# Patient Record
Sex: Female | Born: 1935 | Race: White | Hispanic: No | Smoking: Former smoker
Health system: Southern US, Community
[De-identification: ages and names within clinical notes are randomized; demographics above are authoritative.]

## PROBLEM LIST (undated history)

## (undated) DIAGNOSIS — Z86010 Personal history of colonic polyps: Secondary | ICD-10-CM

## (undated) DIAGNOSIS — G301 Alzheimer's disease with late onset: Secondary | ICD-10-CM

## (undated) DIAGNOSIS — L409 Psoriasis, unspecified: Secondary | ICD-10-CM

## (undated) DIAGNOSIS — K5790 Diverticulosis of intestine, part unspecified, without perforation or abscess without bleeding: Secondary | ICD-10-CM

## (undated) DIAGNOSIS — H25019 Cortical age-related cataract, unspecified eye: Secondary | ICD-10-CM

## (undated) DIAGNOSIS — M1990 Unspecified osteoarthritis, unspecified site: Secondary | ICD-10-CM

## (undated) DIAGNOSIS — M8000XD Age-related osteoporosis with current pathological fracture, unspecified site, subsequent encounter for fracture with routine healing: Secondary | ICD-10-CM

## (undated) DIAGNOSIS — S329XXA Fracture of unspecified parts of lumbosacral spine and pelvis, initial encounter for closed fracture: Secondary | ICD-10-CM

## (undated) DIAGNOSIS — I10 Essential (primary) hypertension: Secondary | ICD-10-CM

## (undated) DIAGNOSIS — I4510 Unspecified right bundle-branch block: Secondary | ICD-10-CM

## (undated) DIAGNOSIS — F32A Depression, unspecified: Secondary | ICD-10-CM

## (undated) DIAGNOSIS — F0281 Dementia in other diseases classified elsewhere with behavioral disturbance: Secondary | ICD-10-CM

## (undated) DIAGNOSIS — R42 Dizziness and giddiness: Secondary | ICD-10-CM

## (undated) DIAGNOSIS — R011 Cardiac murmur, unspecified: Secondary | ICD-10-CM

## (undated) DIAGNOSIS — K649 Unspecified hemorrhoids: Secondary | ICD-10-CM

## (undated) DIAGNOSIS — F329 Major depressive disorder, single episode, unspecified: Secondary | ICD-10-CM

## (undated) DIAGNOSIS — F0280 Dementia in other diseases classified elsewhere without behavioral disturbance: Secondary | ICD-10-CM

## (undated) DIAGNOSIS — Z8669 Personal history of other diseases of the nervous system and sense organs: Secondary | ICD-10-CM

## (undated) DIAGNOSIS — E559 Vitamin D deficiency, unspecified: Secondary | ICD-10-CM

## (undated) DIAGNOSIS — D696 Thrombocytopenia, unspecified: Secondary | ICD-10-CM

## (undated) HISTORY — DX: Cortical age-related cataract, unspecified eye: H25.019

## (undated) HISTORY — DX: Thrombocytopenia, unspecified: D69.6

## (undated) HISTORY — DX: Unspecified right bundle-branch block: I45.10

## (undated) HISTORY — DX: Alzheimer's disease with late onset: G30.1

## (undated) HISTORY — DX: Unspecified osteoarthritis, unspecified site: M19.90

## (undated) HISTORY — PX: ABDOMINAL HYSTERECTOMY: SHX81

## (undated) HISTORY — DX: Fracture of unspecified parts of lumbosacral spine and pelvis, initial encounter for closed fracture: S32.9XXA

## (undated) HISTORY — DX: Essential (primary) hypertension: I10

## (undated) HISTORY — DX: Dementia in other diseases classified elsewhere with behavioral disturbance: F02.81

## (undated) HISTORY — DX: Age-related osteoporosis with current pathological fracture, unspecified site, subsequent encounter for fracture with routine healing: M80.00XD

## (undated) HISTORY — DX: Unspecified hemorrhoids: K64.9

## (undated) HISTORY — DX: Psoriasis, unspecified: L40.9

## (undated) HISTORY — PX: CATARACT EXTRACTION, BILATERAL: SHX1313

## (undated) HISTORY — PX: APPENDECTOMY: SHX54

## (undated) HISTORY — DX: Diverticulosis of intestine, part unspecified, without perforation or abscess without bleeding: K57.90

## (undated) HISTORY — DX: Dizziness and giddiness: R42

## (undated) HISTORY — DX: Personal history of other diseases of the nervous system and sense organs: Z86.69

## (undated) HISTORY — DX: Major depressive disorder, single episode, unspecified: F32.9

## (undated) HISTORY — PX: COLONOSCOPY W/ POLYPECTOMY: SHX1380

## (undated) HISTORY — DX: Depression, unspecified: F32.A

## (undated) HISTORY — DX: Cardiac murmur, unspecified: R01.1

## (undated) HISTORY — DX: Personal history of colonic polyps: Z86.010

## (undated) HISTORY — DX: Vitamin D deficiency, unspecified: E55.9

## (undated) HISTORY — PX: MASTOIDECTOMY: SHX711

## (undated) HISTORY — PX: CHOLECYSTECTOMY: SHX55

---

## 2005-08-26 ENCOUNTER — Ambulatory Visit: Payer: Self-pay | Admitting: Family Medicine

## 2005-12-13 HISTORY — PX: CATARACT EXTRACTION EXTRACAPSULAR: SHX1305

## 2007-12-27 ENCOUNTER — Ambulatory Visit: Payer: Self-pay | Admitting: Family Medicine

## 2008-04-13 ENCOUNTER — Ambulatory Visit: Payer: Self-pay | Admitting: Family Medicine

## 2008-11-22 ENCOUNTER — Ambulatory Visit: Payer: Self-pay | Admitting: Gastroenterology

## 2008-11-22 DIAGNOSIS — Z860100 Personal history of colon polyps, unspecified: Secondary | ICD-10-CM

## 2008-11-22 DIAGNOSIS — K5790 Diverticulosis of intestine, part unspecified, without perforation or abscess without bleeding: Secondary | ICD-10-CM

## 2008-11-22 HISTORY — DX: Personal history of colonic polyps: Z86.010

## 2008-11-22 HISTORY — DX: Diverticulosis of intestine, part unspecified, without perforation or abscess without bleeding: K57.90

## 2008-11-22 HISTORY — DX: Personal history of colon polyps, unspecified: Z86.0100

## 2009-01-07 ENCOUNTER — Ambulatory Visit: Payer: Self-pay | Admitting: Family Medicine

## 2010-04-07 ENCOUNTER — Ambulatory Visit: Payer: Self-pay | Admitting: Family Medicine

## 2012-03-06 LAB — COMPREHENSIVE METABOLIC PANEL
Albumin: 3.9 g/dL (ref 3.4–5.0)
Alkaline Phosphatase: 144 U/L — ABNORMAL HIGH (ref 50–136)
Anion Gap: 12 (ref 7–16)
BUN: 17 mg/dL (ref 7–18)
EGFR (African American): >60
EGFR (Non-African Amer.): >60
Glucose: 126 mg/dL — ABNORMAL HIGH (ref 65–99)
Osmolality: 284 (ref 275–301)
SGOT(AST): 38 U/L — ABNORMAL HIGH (ref 15–37)
SGPT (ALT): 30 U/L
Total Protein: 7 g/dL (ref 6.4–8.2)

## 2012-03-06 LAB — URINALYSIS, COMPLETE
Bacteria: NONE SEEN
Bilirubin,UR: NEGATIVE
Glucose,UR: NEGATIVE mg/dL (ref 0–75)
Leukocyte Esterase: NEGATIVE
Ph: 6 (ref 4.5–8.0)
RBC,UR: <1 /HPF (ref 0–5)
Squamous Epithelial: 1

## 2012-03-06 LAB — CBC
HCT: 40.2 % (ref 35.0–47.0)
MCV: 95 fL (ref 80–100)
RBC: 4.25 10*6/uL (ref 3.80–5.20)
WBC: 11.5 10*3/uL — ABNORMAL HIGH (ref 3.6–11.0)

## 2012-03-06 LAB — CK TOTAL AND CKMB (NOT AT ARMC): CK, Total: 370 U/L — ABNORMAL HIGH (ref 21–215)

## 2012-03-07 ENCOUNTER — Observation Stay: Payer: Self-pay | Admitting: Internal Medicine

## 2012-03-07 LAB — CK TOTAL AND CKMB (NOT AT ARMC)
CK, Total: 250 U/L — ABNORMAL HIGH (ref 21–215)
CK-MB: 2.6 ng/mL (ref 0.5–3.6)

## 2012-03-07 LAB — TROPONIN I
Troponin-I: <0.02 ng/mL
Troponin-I: <0.02 ng/mL

## 2012-03-07 LAB — TSH: Thyroid Stimulating Horm: 0.712 u[IU]/mL

## 2012-03-09 ENCOUNTER — Encounter: Payer: Self-pay | Admitting: Internal Medicine

## 2012-03-13 ENCOUNTER — Encounter: Payer: Self-pay | Admitting: Internal Medicine

## 2012-04-12 ENCOUNTER — Encounter: Payer: Self-pay | Admitting: Internal Medicine

## 2013-03-02 ENCOUNTER — Ambulatory Visit: Payer: Self-pay | Admitting: Family Medicine

## 2013-06-04 ENCOUNTER — Encounter: Payer: Self-pay | Admitting: Neurology

## 2013-06-12 ENCOUNTER — Encounter: Payer: Self-pay | Admitting: Neurology

## 2013-07-13 ENCOUNTER — Encounter: Payer: Self-pay | Admitting: Neurology

## 2013-07-24 ENCOUNTER — Ambulatory Visit: Payer: Self-pay | Admitting: Family Medicine

## 2013-07-27 ENCOUNTER — Ambulatory Visit: Payer: Self-pay | Admitting: Family Medicine

## 2013-08-29 ENCOUNTER — Encounter: Payer: Self-pay | Admitting: Neurology

## 2013-09-01 ENCOUNTER — Ambulatory Visit: Payer: Self-pay | Admitting: Neurology

## 2013-09-12 ENCOUNTER — Encounter: Payer: Self-pay | Admitting: Neurology

## 2014-05-09 ENCOUNTER — Ambulatory Visit: Payer: Self-pay | Admitting: Family Medicine

## 2014-05-09 DIAGNOSIS — R011 Cardiac murmur, unspecified: Secondary | ICD-10-CM

## 2014-05-09 DIAGNOSIS — R42 Dizziness and giddiness: Secondary | ICD-10-CM

## 2014-08-27 ENCOUNTER — Encounter: Payer: Self-pay | Admitting: Family Medicine

## 2014-09-05 ENCOUNTER — Encounter (INDEPENDENT_AMBULATORY_CARE_PROVIDER_SITE_OTHER): Payer: Self-pay

## 2014-09-05 ENCOUNTER — Ambulatory Visit (INDEPENDENT_AMBULATORY_CARE_PROVIDER_SITE_OTHER): Payer: Medicare Other | Admitting: Cardiovascular Disease

## 2014-09-05 ENCOUNTER — Encounter: Payer: Self-pay | Admitting: Cardiovascular Disease

## 2014-09-05 VITALS — BP 129/71 | HR 71 | Ht 64.0 in | Wt 125.0 lb

## 2014-09-05 DIAGNOSIS — H7490 Unspecified disorder of middle ear and mastoid, unspecified ear: Secondary | ICD-10-CM | POA: Insufficient documentation

## 2014-09-05 DIAGNOSIS — Z0000 Encounter for general adult medical examination without abnormal findings: Secondary | ICD-10-CM

## 2014-09-05 DIAGNOSIS — H70899 Other mastoiditis and related conditions, unspecified ear: Secondary | ICD-10-CM

## 2014-09-05 DIAGNOSIS — F419 Anxiety disorder, unspecified: Secondary | ICD-10-CM

## 2014-09-05 DIAGNOSIS — F329 Major depressive disorder, single episode, unspecified: Secondary | ICD-10-CM | POA: Insufficient documentation

## 2014-09-05 DIAGNOSIS — I10 Essential (primary) hypertension: Secondary | ICD-10-CM

## 2014-09-05 DIAGNOSIS — F341 Dysthymic disorder: Secondary | ICD-10-CM

## 2014-09-05 DIAGNOSIS — R42 Dizziness and giddiness: Secondary | ICD-10-CM

## 2014-09-05 NOTE — Patient Instructions (Addendum)
You are doing well. No medication changes were made.  Please monitor your blood pressure and heart rate when you have dizziness Call if you have worsening symptoms, We would order a holter monitor  Please sign up at the [employer]/forever fit leg strength  Please call us if you have new issues that need to be addressed before your next appt.

## 2014-09-05 NOTE — Progress Notes (Signed)
Patient ID: [name], female    DOB: 1934-10-20, 80 y.o.   MRN: [id number]  HPI Comments: [name] is a very pleasant 80 year old woman with history of anxiety, depression, chronic dizziness, prior mastoid surgery, hypertension who presents for symptoms of dizziness.  She presents with her daughter. The patient reports that she has significant stress. She has a son with Down's syndrome who is now with dementia in a nursing home. She's had a long history of depression, sees a Social worker. Maintained on Lexapro.  She reports having significant symptoms of dizziness several weeks ago, approximately 3-4 weeks ago. Since then she has started physical therapy, has become more active. Her symptoms seem to have resolved. She was seen previously by Dr. Pryor Ochoa of ear nose throat. Numerous tests were run given her history of mastoid surgery at a young age. Family reports that these tests were essentially negative. Symptoms do not seem consistent with typical vertigo but she does report some symptoms when turning her head to the left as that does trigger dizziness. She has followup with ENT in several months time.  In general she has been sedentary, no irregular exercise, husband died several years ago. She lives by herself. She walks her dog around her property otherwise does not go and do very much. She reports that stress brings on her dizziness. No symptoms of orthostasis. She was not able to detail much about her symptoms of dizziness and it was uncertain whether this presented while she was sitting or standing, most likely standing. Again it seemed to happen only when she had stress or anxiety. She denied any palpitations or orthostatic type symptoms. She reports having a Holter monitor done last year by Dr. Lovie Macadamia. Results unavailable.  EKG shows normal sinus rhythm with rate 71 beats per minute, right bundle branch block, left anterior fascicular block No change from prior EKG in 2013 Prior  carotid ultrasound March 2013 with no significant disease    Outpatient Encounter Prescriptions as of 09/05/2014  Medication Sig  . amLODipine (NORVASC) 10 MG tablet   . ergocalciferol (VITAMIN D2) 50000 UNITS capsule Take 50,000 Units by mouth once a week.  . escitalopram (LEXAPRO) 10 MG tablet      Review of Systems  Constitutional: Negative.   HENT: Negative.   Eyes: Negative.   Respiratory: Negative.   Cardiovascular: Negative.   Gastrointestinal: Negative.   Endocrine: Negative.   Musculoskeletal: Negative.   Skin: Negative.   Allergic/Immunologic: Negative.   Neurological: Positive for dizziness.  Hematological: Negative.   Psychiatric/Behavioral: Negative.   All other systems reviewed and are negative.   BP 129/71  Pulse 71  Ht 5\' 4"  (1.626 m)  Wt 125 lb (56.7 kg)  BMI 21.45 kg/m2  Physical Exam  Nursing note and vitals reviewed. Constitutional: She is oriented to person, place, and time. She appears well-developed and well-nourished.  HENT:  Head: Normocephalic.  Nose: Nose normal.  Mouth/Throat: Oropharynx is clear and moist.  Eyes: Conjunctivae are normal. Pupils are equal, round, and reactive to light.  Neck: Normal range of motion. Neck supple. No JVD present.  Cardiovascular: Normal rate, regular rhythm, S1 normal, S2 normal, normal heart sounds and intact distal pulses.  Exam reveals no gallop and no friction rub.   No murmur heard. Pulmonary/Chest: Effort normal and breath sounds normal. No respiratory distress. She has no wheezes. She has no rales. She exhibits no tenderness.  Abdominal: Soft. Bowel sounds are normal. She exhibits no distension. There is no tenderness.  Musculoskeletal: Normal range of motion. She exhibits no edema and no tenderness.  Lymphadenopathy:    She has no cervical adenopathy.  Neurological: She is alert and oriented to person, place, and time. Coordination normal.  Skin: Skin is warm and dry. No rash noted. No erythema.   Psychiatric: She has a normal mood and affect. Her behavior is normal. Judgment and thought content normal.    Assessment and Plan

## 2014-09-05 NOTE — Assessment & Plan Note (Signed)
I suspect this is the major issue. She would do better with daily exercise

## 2014-09-05 NOTE — Assessment & Plan Note (Signed)
Suggested monitoring of blood pressure at home. Would decrease the amlodipine in half for any hypotension

## 2014-09-05 NOTE — Assessment & Plan Note (Signed)
Recently seen by Dr. Pryor Ochoa. She has had thorough workup. MRI of the head has been put on hold by the patient

## 2014-09-05 NOTE — Assessment & Plan Note (Signed)
Etiology of her dizziness is likely noncardiac. This seems to be associated with anxiety and depression, and a general feeling of instability. Symptoms seem to have improved with physical therapy. We have recommended she participate in Silver sneakers and forever fit in an effort to maintain her leg strength. He is otherwise independent and able to drive. Recommended he monitor blood pressure and heart rate. Any indication of low blood pressure, recommend that she decrease the amlodipine down to 5 mg daily. If there is concern of arrhythmia based on blood pressure cuff measurements for phone pulse tracking, recommended they call for a Holter monitor

## 2014-09-12 ENCOUNTER — Encounter: Payer: Self-pay | Admitting: Family Medicine

## 2014-10-13 ENCOUNTER — Encounter: Payer: Self-pay | Admitting: Family Medicine

## 2015-02-04 DIAGNOSIS — E559 Vitamin D deficiency, unspecified: Secondary | ICD-10-CM | POA: Diagnosis not present

## 2015-02-04 DIAGNOSIS — I10 Essential (primary) hypertension: Secondary | ICD-10-CM | POA: Diagnosis not present

## 2015-02-04 DIAGNOSIS — Z23 Encounter for immunization: Secondary | ICD-10-CM | POA: Diagnosis not present

## 2015-02-04 DIAGNOSIS — F329 Major depressive disorder, single episode, unspecified: Secondary | ICD-10-CM | POA: Diagnosis not present

## 2015-03-06 DIAGNOSIS — F329 Major depressive disorder, single episode, unspecified: Secondary | ICD-10-CM | POA: Diagnosis not present

## 2015-04-03 DIAGNOSIS — D696 Thrombocytopenia, unspecified: Secondary | ICD-10-CM | POA: Diagnosis not present

## 2015-04-03 DIAGNOSIS — R635 Abnormal weight gain: Secondary | ICD-10-CM | POA: Diagnosis not present

## 2015-04-03 DIAGNOSIS — F329 Major depressive disorder, single episode, unspecified: Secondary | ICD-10-CM | POA: Diagnosis not present

## 2015-04-03 DIAGNOSIS — E559 Vitamin D deficiency, unspecified: Secondary | ICD-10-CM | POA: Diagnosis not present

## 2015-04-03 DIAGNOSIS — R000 Tachycardia, unspecified: Secondary | ICD-10-CM | POA: Diagnosis not present

## 2015-04-03 DIAGNOSIS — R42 Dizziness and giddiness: Secondary | ICD-10-CM | POA: Diagnosis not present

## 2015-04-03 DIAGNOSIS — R748 Abnormal levels of other serum enzymes: Secondary | ICD-10-CM | POA: Diagnosis not present

## 2015-04-06 NOTE — Discharge Summary (Signed)
PATIENT NAME:  [name], [name] MR#:  [id number] DATE OF BIRTH:  November 02, 1934  DATE OF ADMISSION:  03/07/2012 DATE OF DISCHARGE:  03/09/2012  DISCHARGE DIAGNOSES:  1. Syncope. 2. Pubic rami fracture.  3. Elevated creatine kinase.  4. Depression.   DISCHARGE MEDICATIONS:  1. Zoloft 100 mg daily.  2. Norco 5/325 every 4 hours p.r.n.   REASON FOR ADMISSION: The patient is a 81 year old female who was climbing up some stairs when she fell. It was unclear how long she stayed on the floor. According to her daughter, she was unsure if she actually passed out. She was brought to the ER for further treatment.   HOSPITAL COURSE:  1. Syncope: Again, it is not clear whether she actually did pass out. Her work-up was negative with CT. She was monitored while she was here with no other problems. Her daughter did tell us that she falls often.  2. Elevated CPK: She may have had some mild rhabdomyolysis from being down on the floor, but this quickly corrected out.  3. Pubic rami fracture: This was discovered on consultation with the orthopedist. CT was ordered of the pelvis which showed a nondisplaced fracture. This accounted for her pain. She was working with Physical Therapy, who recommended that she go for rehabilitation; so she was being discharged to rehab.   DISPOSITION: The patient is discharged in stable condition to rehab.   TIME SPENT ON DISCHARGE: 35 minutes.  ____________________________ Baxter Hire, MD jdj:cbb D: 03/15/2012 11:44:56 ET T: 03/15/2012 11:53:25 ET JOB#: [id number] Baxter Hire MD ELECTRONICALLY SIGNED 03/15/2012 13:50

## 2015-04-06 NOTE — H&P (Signed)
PATIENT NAME:  [name], [name] MR#:  [id number] DATE OF BIRTH:  09-26-1934  DATE OF ADMISSION:  03/07/2012  PRIMARY CARE PHYSICIAN: Dr. Lovie Macadamia   CHIEF COMPLAINT: Status post fall, syncope, and back pain.   HISTORY OF PRESENT ILLNESS: [name] is a 81 year old pleasant Caucasian female with unremarkable past medical history apart from depression who was in her usual state of health until today around noon. She was trying to go to the attic area and she pulled the stairs down, and while she was climbing up the stairs broke and she fell on her back. She apparently sustained loss of consciousness; however, the patient cannot give details about that. It is unclear how long she stayed on the floor until her daughter managed to get to her around 5:00 p.m. and she found her on the floor. The ambulance was called the patient was transported to the Emergency Department. Evaluation here reveals anterior frontal scalp area laceration with crusted blood. Neurologically she was intact without any focal weakness. CT scan of the head and CT scan of the cervical spine were unremarkable. The patient was admitted for 24-hour observation to monitor for any symptoms of recurrent syncope or any neurologic development.   REVIEW OF SYSTEMS: CONSTITUTIONAL: Denies any fever. No chills. No night sweats. No fatigue. EYES: No blurring of vision. No double vision. ENT: No hearing impairment. No sore throat. No dysphagia. CARDIOVASCULAR: No chest pain. No shortness of breath. No edema. No palpitations. RESPIRATORY: No cough. No sputum production. No chest pain. No shortness of breath. GASTROINTESTINAL: No abdominal pain. No nausea, no vomiting, no diarrhea. GENITOURINARY: No dysuria or frequency of urination. MUSCULOSKELETAL: No joint  pain or swelling. No muscular pain or swelling other than she is sore in her back area, nonlocalized with more pain and soreness in the lower back area. INTEGUMENT: No skin rash. No ulcers apart from  the scalp laceration and the bruise on the frontal scalp area. NEUROLOGY: No focal weakness. No seizure activity. No headache. PSYCHIATRY: No anxiety, but there is history of depression. ENDOCRINE: No polyuria or polydipsia. No heat or cold intolerance.   PAST MEDICAL HISTORY: History of depression.   SOCIAL HABITS: Nonsmoker, but she is an ex-chronic smoker, quit 12 years ago. She drinks alcohol only socially, usually wine. No other drug abuse.   SOCIAL HISTORY: She is widowed. Lives at home alone and is visited by her daughter.   FAMILY HISTORY: Her mother died from stroke in her 13s. She has a twin sister who has hypertension and breast cancer.   ADMISSION MEDICATIONS:  1. Zoloft 100 mg a day.  2. Prozac 40 mg, this is only sometimes.   ALLERGIES: No known drug allergies.   PHYSICAL EXAMINATION:  VITAL SIGNS: Blood pressure 161/73, respiratory rate 18, pulse 105, temperature 96.4, oxygen saturation 95%.   GENERAL APPEARANCE: Elderly lady laying in bed in no acute distress.   HEAD AND NECK EXAMINATION: No pallor. No icterus. No cyanosis.   ENT: Hearing was normal. Nasal mucosa, lips, tongue were normal.   EYES: Normal iris and conjunctivae. Pupils about 4 to 5 mm, equal, sluggishly reactive to light.   NECK: Supple. Trachea at midline. No thyromegaly. No cervical lymphadenopathy. No masses.   HEART: Normal S1, S2. No S3, S4. No murmur. No gallop. No carotid bruits.   RESPIRATORY: Normal breathing pattern without use of accessory muscles. No rales. No wheezing.   ABDOMEN: Soft without tenderness. No hepatosplenomegaly. No masses. No hernias.   MUSCULOSKELETAL: No joint  swelling. No clubbing.   SKIN: No ulcers. No subcutaneous nodules. There is anterior scalp small laceration with crusted blood.   NEUROLOGIC: Cranial nerves II through XII are intact. No focal motor deficit.   PSYCHIATRIC: The patient is alert and oriented times three. Mood and affect were normal.    LABORATORY, DIAGNOSTIC, AND RADIOLOGICAL DATA: EKG showed sinus tachycardia at rate of 105 per minute. Right bundle branch block. Otherwise unremarkable EKG. CT scan of the head without contrast showed no acute intracranial process. CT scan of the cervical spine revealed no acute osseous injury of the cervical spine. Serum glucose 126, BUN 17, creatinine 0.5, sodium 141, potassium 3.5. Her liver function tests showed total protein 7, albumin 3.9, bilirubin 2.1, alkaline phosphatase 144, AST elevated at 38, ALT normal at 30. Total CPK elevated at 370, CK-MB fraction up at 5.5. Troponin was normal at 0.02. CBC showed white count of 11,000, hemoglobin 13, hematocrit 40, platelet count 132. Urinalysis was unremarkable.   ASSESSMENT:  1. Syncope status post fall without any focal neurologic finding. 2. Elevated CPK likely from rhabdomyolysis from her fall or being on the floor for several hours. Elevated bilirubin and alkaline phosphatase of unclear etiology. 3. Mild elevation of  white blood cell count and mild thrombocytopenia.  4. History of depression.  5. Mild anterior scalp laceration.   PLAN: The patient will be admitted to the medical floor for observation with frequent neurologic examination and check-ups. Gentle IV hydration. I will follow up on her CPK and troponin. The mild elevation of bilirubin and alkaline phosphatase can be followed up as an outpatient. I will obtain an x-ray of the lumbosacral spine since she has low back pain. Monitor her blood pressure as it is elevated. She might have underlying hypertension. I will continue the Zoloft, but hold the Prozac.   TIME SPENT EVALUATING THIS PATIENT: More than 45 minutes.     ____________________________ Clovis Pu. Lenore Manner, MD amd:bjt D: 03/06/2012 22:41:57 ET T: 03/07/2012 09:29:11 ET JOB#: [id number]  cc: Clovis Pu. Lenore Manner, MD, <Dictator> Youlanda Roys. Lovie Macadamia, MD Ellin Saba MD ELECTRONICALLY SIGNED 03/07/2012 22:52

## 2015-04-06 NOTE — Consult Note (Signed)
Brief Consult Note: Diagnosis: Left pelvic fracture.   Patient was seen by consultant.   Comments: PWB with Physical Therapy Will need rehab or home Physical Therapy Needs ortho F/U in 3-4 weeks.  Electronic Signatures: Lynnda Shields (MD)  (Signed 26-Mar-13 18:16)  Authored: Brief Consult Note   Last Updated: 26-Mar-13 18:16 by Lynnda Shields (MD)

## 2015-04-06 NOTE — Consult Note (Signed)
PATIENT NAME:  [name], [name] MR#:  [id number] DATE OF BIRTH:  Jul 12, 1934  DATE OF CONSULTATION:  03/07/2012  REFERRING PHYSICIAN:  Dr. Lenore Manner  CONSULTING PHYSICIAN:  Alysia Penna. Jove Beyl, MD  REASON FOR CONSULTATION: A 81 year old female with left hip/groin pain.   HISTORY OF PRESENT ILLNESS: The patient suffered a fall down some folding stairs going into the attic on 03/25. She injured her left hip. She was admitted for treatment for syncope, back pain, and hip pain. She had x-rays obtained and no fracture was noted. Physical therapy was started and she complained of significant left hip and groin pain. She denies any significant difficulty in this area. She does have a history of left-sided low back pain.   Also with her fall she suffered a laceration to her head. She had a head CT and CT of the cervical spine which were unremarkable. She did have some confusion, but this has now cleared.   PAST MEDICAL HISTORY: Depression.   SOCIAL HISTORY: Ex-smoker, quit 12 years ago. Occasional social alcohol drinker, usually wine. No drug use. She lives at home alone with close family members nearby.   FAMILY HISTORY: Cerebrovascular disease, hypertension, and breast cancer.   MEDICATIONS PRESENTLY: Per SCM. These are reviewed.   ALLERGIES: No known drug allergies.   REVIEW OF SYSTEMS: Unremarkable for any acute cardiorespiratory, GI, or GU symptoms. No fever, chills, or constitutional symptoms except as noted above.   PHYSICAL EXAMINATION:  GENERAL: The patient is alert and oriented. She is sitting in the bed eating.   VITALS: Blood pressure 150/80, pulse 80, respirations 18.   HEENT: Pupils equal, round, and reactive to light and accommodation. Extraocular movements intact.   NECK: Supple without bruits.   THORACIC SPINE: Nontender.   LUMBOSACRAL SPINE: Mildly tender in lumbosacral junction particularly on the left side. There is mild tenderness over the sacrum and over the left SI joint.    LEFT LOWER EXTREMITY: Left groin shows pain with range of motion located more toward the midline. Mild tenderness over the greater trochanter.  Knee, foot, and ankle are nontender. Neurovascular examination of the left lower extremity is intact.   RIGHT LOWER EXTREMITY: Right hip, knee, foot and ankle show full range of motion with normal neurovascular examination.   X-RAYS: X-rays are reviewed. The pelvis film and left hip film do show pelvic fractures of the inferior and superior pubic ramus on the left side. Other films are unremarkable including a CT scan of the neck except for showing degenerative change.   IMPRESSION:  1. Left pubic rami fracture.  2. Confusion, cleared.  3. Head laceration. No sequelae.  4. Medical issues as noted above.   PLAN: Physical therapy. Progressive weightbearing as tolerated. Follow up in orthopedics in 4 to 6 weeks, sooner if needed.  Will either most likely need rehab placement or home physical therapy.   ____________________________ Alysia Penna. Mauri Pole, MD jcc:bjt D: 03/08/2012 07:59:25 ET T: 03/08/2012 09:56:44 ET JOB#: [id number]  cc: Alysia Penna. Mauri Pole, MD, <Dictator> Alysia Penna Galo Sayed MD ELECTRONICALLY SIGNED 04/07/2012 6:58

## 2015-04-14 ENCOUNTER — Other Ambulatory Visit: Payer: Self-pay | Admitting: Family Medicine

## 2015-04-14 DIAGNOSIS — R748 Abnormal levels of other serum enzymes: Secondary | ICD-10-CM

## 2015-04-17 ENCOUNTER — Ambulatory Visit: Admission: RE | Admit: 2015-04-17 | Payer: Self-pay | Source: Ambulatory Visit

## 2015-05-15 ENCOUNTER — Telehealth: Payer: Self-pay

## 2015-05-15 NOTE — Telephone Encounter (Signed)
She would like to speak with you in regards to having some increased issues with depression. Having a hard time dealing with emotions for her mentally challenged son. I asked her if she was having any thoughts of self harm and she said no. I offered her a phone number to the mobile crisis line and she refused and stated that you know her history and she'd rather talk with you first.

## 2015-05-15 NOTE — Telephone Encounter (Signed)
I talked with patient at lunch, gave her instructions to call counselor, call us back if naything needed

## 2015-07-08 ENCOUNTER — Ambulatory Visit: Payer: Self-pay | Admitting: Family Medicine

## 2015-07-25 ENCOUNTER — Telehealth: Payer: Self-pay | Admitting: Family Medicine

## 2015-07-25 NOTE — Telephone Encounter (Signed)
Patient called needing an appointment for depression. She stated that her son died 1 week ago and she has fallen in a bad depression. I looked to see if we have any appt available soon but nothing until 08/06/15. She got upset and hanged the phone up. She didn't even give me a chance to look with another provider. Please call patient, thanks.

## 2015-07-25 NOTE — Telephone Encounter (Signed)
I called and offered my condolences She is not seeing a psychiatrist or counselor I told her to come in at 2:30 pm today  [place]  [place]  [phone]  [place]  [place]  [phone]

## 2015-07-28 ENCOUNTER — Ambulatory Visit: Payer: Self-pay | Admitting: Family Medicine

## 2015-07-31 ENCOUNTER — Ambulatory Visit: Payer: Self-pay | Admitting: Family Medicine

## 2015-08-01 ENCOUNTER — Ambulatory Visit: Payer: Self-pay | Admitting: Family Medicine

## 2015-09-04 ENCOUNTER — Other Ambulatory Visit: Payer: Self-pay

## 2015-09-04 NOTE — Telephone Encounter (Signed)
In her PP chart, we have Wellbutrin XL 150mg . The refill request was for 300mg . I did not see any note on it being increased, so I left the 150mg  strength in there.

## 2015-09-05 MED ORDER — BUPROPION HCL ER (XL) 150 MG PO TB24: ORAL_TABLET | ORAL | Status: DC

## 2015-09-05 NOTE — Telephone Encounter (Signed)
I spoke with [place] at [facility]. She used to be on 300mg  over a year ago and then it looks like she stopped it. You restarted her back on the 150mg  back in March, but it appears like she must have called in a 300mg  rx # that had refills. She has been getting the 300mg  for a while now but Arby Barrette states that she isn't getting it every month.

## 2015-09-05 NOTE — Telephone Encounter (Signed)
Called patient to schedule a f/u appointment for mood in the next 2 weeks. I left 2 vm on her answering machine to call us back.

## 2015-09-05 NOTE — Telephone Encounter (Signed)
[name] --> Please let [name] know that I'd like to see patient for an appointment here in the office for:  mood Please schedule a visit with me  in the next: two weeks Fasting?  Not necessary Thank you, Dr. Lovie Macadamia send Rx AMY --> find out from pharmacy if WE prescribed the 300 mg or if that came from another prescriber (psychiatrist); she should really be under the care of psychiatrist Back to me after you find out more about the 300 mg strength request

## 2016-01-21 ENCOUNTER — Ambulatory Visit: Payer: Self-pay | Admitting: Family Medicine

## 2016-01-21 DIAGNOSIS — E559 Vitamin D deficiency, unspecified: Secondary | ICD-10-CM | POA: Insufficient documentation

## 2016-01-21 DIAGNOSIS — F329 Major depressive disorder, single episode, unspecified: Secondary | ICD-10-CM | POA: Insufficient documentation

## 2016-01-21 DIAGNOSIS — I10 Essential (primary) hypertension: Secondary | ICD-10-CM | POA: Insufficient documentation

## 2016-01-21 DIAGNOSIS — F32A Depression, unspecified: Secondary | ICD-10-CM | POA: Insufficient documentation

## 2016-01-21 DIAGNOSIS — R011 Cardiac murmur, unspecified: Secondary | ICD-10-CM | POA: Insufficient documentation

## 2016-01-21 DIAGNOSIS — D696 Thrombocytopenia, unspecified: Secondary | ICD-10-CM | POA: Insufficient documentation

## 2016-01-21 DIAGNOSIS — I4510 Unspecified right bundle-branch block: Secondary | ICD-10-CM | POA: Insufficient documentation

## 2016-02-04 ENCOUNTER — Ambulatory Visit: Payer: Self-pay | Admitting: Family Medicine

## 2016-02-05 ENCOUNTER — Ambulatory Visit (INDEPENDENT_AMBULATORY_CARE_PROVIDER_SITE_OTHER): Payer: PPO | Admitting: Family Medicine

## 2016-02-05 ENCOUNTER — Encounter: Payer: Self-pay | Admitting: Family Medicine

## 2016-02-05 ENCOUNTER — Telehealth: Payer: Self-pay | Admitting: Family Medicine

## 2016-02-05 VITALS — BP 127/82 | HR 81 | Temp 98.5°F | Ht 61.5 in | Wt 125.6 lb

## 2016-02-05 DIAGNOSIS — F331 Major depressive disorder, recurrent, moderate: Secondary | ICD-10-CM | POA: Diagnosis not present

## 2016-02-05 DIAGNOSIS — D696 Thrombocytopenia, unspecified: Secondary | ICD-10-CM | POA: Diagnosis not present

## 2016-02-05 DIAGNOSIS — Z23 Encounter for immunization: Secondary | ICD-10-CM | POA: Diagnosis not present

## 2016-02-05 DIAGNOSIS — I10 Essential (primary) hypertension: Secondary | ICD-10-CM | POA: Diagnosis not present

## 2016-02-05 DIAGNOSIS — E559 Vitamin D deficiency, unspecified: Secondary | ICD-10-CM

## 2016-02-05 MED ORDER — BUPROPION HCL ER (XL) 150 MG PO TB24: 150.0000 mg | ORAL_TABLET | Freq: Every day | ORAL | Status: DC

## 2016-02-05 NOTE — Assessment & Plan Note (Signed)
Check CBC today.  

## 2016-02-05 NOTE — Assessment & Plan Note (Signed)
Check level and supplement as indicated 

## 2016-02-05 NOTE — Assessment & Plan Note (Addendum)
Check thyroid, vit D, B12; this has been lifelong per patient; we reviewed previous medicines, what has worked best for her; start her back on wellbutrin; close f/u in 2-3 weeks; call me with any issues prior to next appt; suggested she work with grief counselor at [facility], number provided

## 2016-02-05 NOTE — Telephone Encounter (Signed)
Patient daughter called stating that she needs to talk tot Dr. Sanda Klein regarding her mothers memory issues. Please call her back, thanks.

## 2016-02-05 NOTE — Patient Instructions (Addendum)
Let's start medicine We'll check labs today  Your goal blood pressure is less than 150 mmHg on top at least, and under 140 mmHg is fantastic Try to follow the DASH guidelines (DASH stands for Dietary Approaches to Stop Hypertension) Try to limit the sodium in your diet.  Ideally, consume less than 1.5 grams (less than 1,500mg ) per day. Do not add salt when cooking or at the table.  Check the sodium amount on labels when shopping, and choose items lower in sodium when given a choice. Avoid or limit foods that already contain a lot of sodium. Eat a diet rich in fruits and vegetables and whole grains.  Consider working with a Social worker, and [facility] has wonderful grief counselors: [phone] Return to see me in 2-3 weeks for medicine follow-up but call me sooner if needed  You received the flu shot today; it should protect you against the flu virus over the coming months; it will take about two weeks for antibodies to develop; do try to stay away from hospitals, nursing homes, and daycares during peak flu season; taking extra vitamin C daily during flu season may help you avoid getting sick  You have received the Pneumovax vaccine (PPSV-23) and you will not need another booster of this for the rest of your life per current ACIP guidelines

## 2016-02-05 NOTE — Progress Notes (Signed)
BP 127/82 mmHg  Pulse 81  Temp(Src) 98.5 F (36.9 C)  Ht 5' 1.5" (1.562 m)  Wt 125 lb 9.6 oz (56.972 kg)  BMI 23.35 kg/m2  SpO2 98%   Subjective:    Patient ID: [name], female    DOB: Jul 23, 1934, 82 y.o.   MRN: [id number]  HPI: [name] is a 82 y.o. female  Chief Complaint  Patient presents with  . Follow-up  . Depression    After her son's death and hasn't got over it. She stated "I watched him take his last breath". Son had Down Syndrome. Son passed away 9 months ago.   Marland Kitchen Anxiety   She says that she wants a brighter outlook on life; the depression is getting the absolute best of me she says After her son died, things started to go downhill She has not been seeing any mental health professional in a long time Daughter is too busy with her own family to have much time with her Has a dog at home She has a lady at church who is absolutely marvelous; she lost her husband 3 months ago Goes to [employer] She has not been on medicine for a long time; ready to start back on something Sick and tired of allowing herself to fight to be happy We reviewed her old mediciines, bupropion, escitalopram, Brintellx; she did best on the bupropion  Depression screen [facility] 2/9 02/05/2016  Decreased Interest 3  Down, Depressed, Hopeless 3  PHQ - 2 Score 6  Altered sleeping 0  Tired, decreased energy 2  Change in appetite 1  Feeling bad or failure about yourself  3  Trouble concentrating 2  Moving slowly or fidgety/restless 0  Suicidal thoughts 0  PHQ-9 Score 14  Difficult doing work/chores Not difficult at all   GAD 7 : Generalized Anxiety Score 02/05/2016  Nervous, Anxious, on Edge 3  Control/stop worrying 3  Worry too much - different things 3  Trouble relaxing 2  Restless 0  Easily annoyed or irritable 2  Afraid - awful might happen 3  Total GAD 7 Score 16  Anxiety Difficulty Not difficult at all   History of vit D deficiency  HTN; well-controlled on no  medicines  Flu shot and pneumonia shot given by staff prior to my entering room  Relevant past medical, surgical, family and social history reviewed and updated as indicated Past Medical History  Diagnosis Date  . Dizziness   . Depression   . Hx of migraine headaches   . Vitamin D deficiency disease   . Hypertension   . RBBB   . Thrombocytopenia ([place])   . Heart murmur    Past Surgical History  Procedure Laterality Date  . Hysterotomy    . Abdominal hysterectomy    . Cholecystectomy     Family History  Problem Relation Age of Onset  . Atrial fibrillation Sister   . Arthritis Mother   . Arthritis Father    Social History  Substance Use Topics  . Smoking status: Former Research scientist (life sciences)  . Smokeless tobacco: Never Used  . Alcohol Use: 1.8 oz/week    3 Glasses of wine per week     Comment: Per week   Interim medical history since our last visit reviewed. Allergies and medications reviewed and updated.  Review of Systems Per HPI unless specifically indicated above     Objective:    BP 127/82 mmHg  Pulse 81  Temp(Src) 98.5 F (36.9 C)  Ht  5' 1.5" (1.562 m)  Wt 125 lb 9.6 oz (56.972 kg)  BMI 23.35 kg/m2  SpO2 98%  Wt Readings from Last 3 Encounters:  02/05/16 125 lb 9.6 oz (56.972 kg)  04/03/15 135 lb (61.236 kg)  09/05/14 125 lb (56.7 kg)    Physical Exam  Constitutional: She appears well-developed and well-nourished.  Weight fluctuated up 10 pounds and then back down 10 pounds  HENT:  Mouth/Throat: Mucous membranes are normal.  Eyes: EOM are normal. No scleral icterus.  Cardiovascular: Normal rate and regular rhythm.   Pulmonary/Chest: Effort normal and breath sounds normal.  Abdominal: She exhibits no distension.  Neurological: She displays no tremor. Gait normal.  Skin: Skin is warm. No pallor.  Psychiatric: She has a normal mood and affect. Her behavior is normal. Judgment normal. Her mood appears not anxious. Her affect is not blunt and not labile. Cognition  and memory are normal. She does not exhibit a depressed mood. She expresses no suicidal ideation.  Bright affect, engaging, conversant, good eye contact, good hygiene      Assessment & Plan:   Problem List Items Addressed This Visit      Cardiovascular and Mediastinum   Essential hypertension    Controlled today; not on any meds; try DASH guidelines        Other   Vitamin D deficiency disease    Check level and supplement as indicated      Relevant Orders   VITAMIN D 25 Hydroxy (Vit-D Deficiency, Fractures)   Thrombocytopenia (HCC)    Check CBC today      Relevant Orders   CBC with Differential/Platelet   Major depressive disorder, recurrent, moderate (HCC) - Primary    Check thyroid, vit D, B12; this has been lifelong per patient; we reviewed previous medicines, what has worked best for her; start her back on wellbutrin; close f/u in 2-3 weeks; call me with any issues prior to next appt; suggested she work with Barrister's clerk at [employer], number provided      Relevant Medications   buPROPion (WELLBUTRIN XL) 150 MG 24 hr tablet   Other Relevant Orders   TSH   Vitamin B12    Other Visit Diagnoses    Need for influenza vaccination        offered and given today    Relevant Orders    Flu Vaccine QUAD 36+ mos PF IM (Fluarix & Fluzone Quad PF) (Completed)    Need for pneumococcal vaccination        PPSV-23 given by CMA today; no further boosters of this pneumonia vaccine needed for the rest of her life per ACIP guidelines    Relevant Orders    Pneumococcal polysaccharide vaccine 23-valent greater than or equal to 2yo subcutaneous/IM (Completed)       Follow up plan: Return 2-3 weeks, for medication follow-up.  Meds ordered this encounter  Medications  . buPROPion (WELLBUTRIN XL) 150 MG 24 hr tablet    Sig: Take 1 tablet (150 mg total) by mouth daily.    Dispense:  30 tablet    Refill:  0   Orders Placed This Encounter  Procedures  . Flu Vaccine QUAD 36+ mos PF  IM (Fluarix & Fluzone Quad PF)  . Pneumococcal polysaccharide vaccine 23-valent greater than or equal to 2yo subcutaneous/IM  . TSH  . VITAMIN D 25 Hydroxy (Vit-D Deficiency, Fractures)  . CBC with Differential/Platelet  . Vitamin B12   An after-visit summary was printed and given to the patient  at [place].  Please see the patient instructions which may contain other information and recommendations beyond what is mentioned above in the assessment and plan.

## 2016-02-05 NOTE — Assessment & Plan Note (Signed)
Controlled today; not on any meds; try DASH guidelines

## 2016-02-06 ENCOUNTER — Telehealth: Payer: Self-pay | Admitting: Family Medicine

## 2016-02-06 DIAGNOSIS — E538 Deficiency of other specified B group vitamins: Secondary | ICD-10-CM

## 2016-02-06 LAB — CBC WITH DIFFERENTIAL/PLATELET
Basophils Absolute: 0 10*3/uL (ref 0.0–0.2)
Basos: 1 %
EOS (ABSOLUTE): 0.1 10*3/uL (ref 0.0–0.4)
Eos: 1 %
Hematocrit: 43.6 % (ref 34.0–46.6)
Hemoglobin: 15 g/dL (ref 11.1–15.9)
IMMATURE GRANULOCYTES: 0 %
Immature Grans (Abs): 0 10*3/uL (ref 0.0–0.1)
LYMPHS ABS: 1.8 10*3/uL (ref 0.7–3.1)
Lymphs: 26 %
MCH: 31.3 pg (ref 26.6–33.0)
MCHC: 34.4 g/dL (ref 31.5–35.7)
MCV: 91 fL (ref 79–97)
MONOS ABS: 0.6 10*3/uL (ref 0.1–0.9)
Monocytes: 8 %
NEUTROS PCT: 64 %
Neutrophils Absolute: 4.3 10*3/uL (ref 1.4–7.0)
PLATELETS: 173 10*3/uL (ref 150–379)
RBC: 4.79 x10E6/uL (ref 3.77–5.28)
RDW: 13 % (ref 12.3–15.4)
WBC: 6.8 10*3/uL (ref 3.4–10.8)

## 2016-02-06 LAB — TSH: TSH: 0.788 u[IU]/mL (ref 0.450–4.500)

## 2016-02-06 LAB — VITAMIN B12: Vitamin B-12: 284 pg/mL (ref 211–946)

## 2016-02-06 LAB — VITAMIN D 25 HYDROXY (VIT D DEFICIENCY, FRACTURES): Vit D, 25-Hydroxy: 14.7 ng/mL — ABNORMAL LOW (ref 30.0–100.0)

## 2016-02-06 NOTE — Telephone Encounter (Signed)
Routing to provider  

## 2016-02-06 NOTE — Telephone Encounter (Signed)
I called to let patient know about lab results; only number I had for her was voicemail that said "Posey Pronto" (daughter)

## 2016-02-07 DIAGNOSIS — E538 Deficiency of other specified B group vitamins: Secondary | ICD-10-CM | POA: Insufficient documentation

## 2016-02-07 MED ORDER — CYANOCOBALAMIN 1000 MCG/ML IJ SOLN: 1000.0000 ug | Freq: Once | INTRAMUSCULAR | Status: DC

## 2016-02-07 NOTE — Telephone Encounter (Signed)
I tried to call again; left messages yesterday and today I explained today (Saturday) that I am out of the office until Wednesday I will close this phone note and send a "remind me" message to call her when I return [facility] staff- if she calls, let her know I tried my best to reach her and will call her on Wednesday If there are any urgent issues, address with a provider in the office please

## 2016-02-07 NOTE — Telephone Encounter (Signed)
I called home number to talk about lab results; once again, reached voicemail that said "Posey Pronto" I explained that I am trying to reach the patient about lab results; sorry to have missed her; I'll be out of town until Wednesday Contact info copied and pasted: JANKI MATULICH 82 year old female Oct 27, 1934  [place] [address] [phone] (H) ------------------------------- [facility] staff - try to reach patient please Let her know that her B12 is low and to come in for a B12 shot once a week for 4 weeks total, then once a month x 6 months I am working from home, and the in-house order with the bed isn't appearing (?), so I cannot find a way to enter the actual order; see med list Also, her vitamin D is low enough that I want her to start vitamin D prescription, already sent in Thank you

## 2016-02-09 NOTE — Telephone Encounter (Signed)
Patient's daughter, [name] was notified of lab results.

## 2016-02-09 NOTE — Telephone Encounter (Signed)
Left message on [name]'s work number, [phone]

## 2016-02-11 ENCOUNTER — Telehealth: Payer: Self-pay | Admitting: Family Medicine

## 2016-02-11 MED ORDER — ERGOCALCIFEROL 1.25 MG (50000 UT) PO CAPS: 50000.0000 [IU] | ORAL_CAPSULE | ORAL | Status: DC

## 2016-02-11 NOTE — Telephone Encounter (Signed)
Routing to provider for advice.

## 2016-02-11 NOTE — Telephone Encounter (Signed)
Pt is having a hard time with her depression and would like a call back to discuss other ways to cope until her appt with [place] next Thursday.

## 2016-02-11 NOTE — Telephone Encounter (Signed)
I talked with the patient She says that everything that could go wrong with her taxes could go wrong She is having trouble with memory Explained that the vit D is low, but I needed to ask her about this because it was in her medicine list as a current medicine but hadn't been prescribed since 2105 She says NO, she hadn't been taking the vitamin D, quit taking it a long time ago; I don't know why it was still listed as a current med by staff I told her let's get her BACK on the vitamin D now, Rx sent Come in tomorrow for a B12 shot, once a week for four weeks, then once a month She'll come in at 1:00 pm Thursday March 2nd for a CMA visit, B12 injection <--- Helene Kelp, please put her on the schedule She felt so much better hearing that there was a reason for her symptoms possibly She sounded great, alert

## 2016-02-12 ENCOUNTER — Ambulatory Visit: Payer: PPO

## 2016-02-12 ENCOUNTER — Telehealth: Payer: Self-pay | Admitting: Family Medicine

## 2016-02-12 NOTE — Telephone Encounter (Signed)
-----   Message from Arnetha Courser, MD sent at 02/07/2016  1:34 PM EST ----- Regarding: call daughter on Wednesday Call daughter

## 2016-02-12 NOTE — Telephone Encounter (Signed)
Daughter brought by notes about memory issues I called and left detailed message I would be glad to meet with her and her mother for an appt in the office here or could chat by phone, whichever she prefers Please call office Friday to either schedule appt or let me know her wishes

## 2016-02-13 NOTE — Telephone Encounter (Signed)
[name]'s daughter returned your call to advise that a telephone conversation would be great before you see her mother next week.  Please call [name]'s daughter-Carrie Rosana Hoes at [phone].

## 2016-02-16 ENCOUNTER — Ambulatory Visit (INDEPENDENT_AMBULATORY_CARE_PROVIDER_SITE_OTHER): Payer: PPO | Admitting: Family Medicine

## 2016-02-16 ENCOUNTER — Encounter: Payer: Self-pay | Admitting: Family Medicine

## 2016-02-16 VITALS — BP 151/73 | HR 70 | Temp 97.9°F | Ht 62.0 in | Wt 125.0 lb

## 2016-02-16 DIAGNOSIS — F331 Major depressive disorder, recurrent, moderate: Secondary | ICD-10-CM

## 2016-02-16 DIAGNOSIS — E538 Deficiency of other specified B group vitamins: Secondary | ICD-10-CM

## 2016-02-16 DIAGNOSIS — E559 Vitamin D deficiency, unspecified: Secondary | ICD-10-CM

## 2016-02-16 DIAGNOSIS — D696 Thrombocytopenia, unspecified: Secondary | ICD-10-CM | POA: Diagnosis not present

## 2016-02-16 MED ORDER — BUPROPION HCL ER (XL) 150 MG PO TB24: 150.0000 mg | ORAL_TABLET | Freq: Every day | ORAL | Status: DC

## 2016-02-16 MED ORDER — CYANOCOBALAMIN 1000 MCG/ML IJ SOLN
1000.0000 ug | Freq: Once | INTRAMUSCULAR | Status: AC
  Administered 2016-02-16: 1000 ug via INTRAMUSCULAR

## 2016-02-16 NOTE — Assessment & Plan Note (Addendum)
PHQ-9 score has improved significantly; encoruaged her to work with grief counselor and/or depression counselor, list of resources given, number to Hospice also given today; close f/u in 3 weeks

## 2016-02-16 NOTE — Assessment & Plan Note (Addendum)
encourged patient to start the vitamin D supplementation; reviewed her labs with her today; explained vit D could worsen dry skin, depression, memory; she will pick up Rx at drug store and start that; I'll plan to recheck level in a few months

## 2016-02-16 NOTE — Assessment & Plan Note (Signed)
Most recent CBC reveiwed; platelet count back to normal

## 2016-02-16 NOTE — Assessment & Plan Note (Signed)
Reviewed her labs with her; explained B12 level should really be above 400; B12 injection given today, then weekly x 3 more weeks, then monthly

## 2016-02-16 NOTE — Patient Instructions (Addendum)
Please pick up the vitamin D prescription today and start taking those as directed, one a week for 8 weeks  Return each week for 3 more weeks for B12 injections, then once a month for ongoing B12 shots CRIS[place]AN: March 13th -- B12 injection only March 20th -- B12 injection only March 27th -- B12 injection and doctor visit all here  [place]: April 27th -- B12 injection May 26th -- B12 injection and doctor visit  Consider working with a counselor, and Hospice has wonderful grief counselors: [phone]  You can also work with a Social worker who specializes in depression; see the list of names and numbers (hand-out from [employer])

## 2016-02-16 NOTE — Progress Notes (Signed)
BP 151/73 mmHg  Pulse 70  Temp(Src) 97.9 F (36.6 C)  Ht 5\' 2"  (1.575 m)  Wt 125 lb (56.7 kg)  BMI 22.86 kg/m2  SpO2 99%   Subjective:    Patient ID: [name], female    DOB: 1934/09/26, 82 y.o.   MRN: [id number]  HPI: [name] is a 82 y.o. female  Chief Complaint  Patient presents with  . Depression    follow up  . Other    Per patient's daughter, she does not want you to discuss her mom's memory issues with her until you talk with her first.  . B12    she is due for B12 injection. She did not come in last week.  . Vit D    she has not picked up the rx at the pharmacy yet.  . Immunizations    She is interested in the shingles vaccine.   Depression screen [facility] 2/9 02/16/2016 02/05/2016  Decreased Interest 1 3  Down, Depressed, Hopeless 2 3  PHQ - 2 Score 3 6  Altered sleeping 0 0  Tired, decreased energy 2 2  Change in appetite 0 1  Feeling bad or failure about yourself  2 3  Trouble concentrating 1 2  Moving slowly or fidgety/restless 0 0  Suicidal thoughts 0 0  PHQ-9 Score 8 14  Difficult doing work/chores Somewhat difficult Not difficult at all   Patient is here for follow-up of depression; she is doing better; she was started on bupropion at the last visit; PHQ-9 score imprved as noted above; I do not think the "not difficult at all" documentation from 02/05/16 was accurate  She says she has not picked up her vitamin D yet; discussed what vit D can cause; no problems with diarrhea; dry skin not new; depression yes, and memory issues are yes She is noticing short-term memory problems, brought up by her; she has always been one to walk into a room and not know why she was there Has trouble remembering what was said in a conversation two weeks ago; noticed 2-3 months now She has low B12; did get B12 injection today and will return for weekly shots for 3 more weeks Good appetite, has to watch herself Good balanced diet, has to work at that since she lives  alone She has not problems with memory and driving; she feels safe driving and behind the wheel of a car Her children are limiting her in what she likes to do Lives alone, 4 acres; has a Magazine features editor; son lives behind her on [address] (she jokes that the road wasn't named by them)  Relevant past medical, social history reviewed and updated as indicated. Interim medical history since our last visit reviewed. Allergies and medications reviewed and updated.  Review of Systems Per HPI unless specifically indicated above     Objective:    BP 151/73 mmHg  Pulse 70  Temp(Src) 97.9 F (36.6 C)  Ht 5\' 2"  (1.575 m)  Wt 125 lb (56.7 kg)  BMI 22.86 kg/m2  SpO2 99%  Wt Readings from Last 3 Encounters:  02/16/16 125 lb (56.7 kg)  02/05/16 125 lb 9.6 oz (56.972 kg)  04/03/15 135 lb (61.236 kg)    Physical Exam  Constitutional: She appears well-developed and well-nourished. No distress.  Eyes: EOM are normal. No scleral icterus.  Neck: No thyromegaly present.  Cardiovascular: Normal rate.   Pulmonary/Chest: Effort normal.  Skin: She is not diaphoretic. No pallor.  Psychiatric: She has  a normal mood and affect. Her speech is normal and behavior is normal. Judgment and thought content normal. Cognition and memory are not impaired.  Casually, neatly dressed; good hygiene; excellent eye contact with examiner   Results for orders placed or performed in visit on 02/05/16  TSH  Result Value Ref Range   TSH 0.788 0.450 - 4.500 uIU/mL  VITAMIN D 25 Hydroxy (Vit-D Deficiency, Fractures)  Result Value Ref Range   Vit D, 25-Hydroxy 14.7 (L) 30.0 - 100.0 ng/mL  CBC with Differential/Platelet  Result Value Ref Range   WBC 6.8 3.4 - 10.8 x10E3/uL   RBC 4.79 3.77 - 5.28 x10E6/uL   Hemoglobin 15.0 11.1 - 15.9 g/dL   Hematocrit 43.6 34.0 - 46.6 %   MCV 91 79 - 97 fL   MCH 31.3 26.6 - 33.0 pg   MCHC 34.4 31.5 - 35.7 g/dL   RDW 13.0 12.3 - 15.4 %   Platelets 173 150 - 379 x10E3/uL   Neutrophils 64 %    Lymphs 26 %   Monocytes 8 %   Eos 1 %   Basos 1 %   Neutrophils Absolute 4.3 1.4 - 7.0 x10E3/uL   Lymphocytes Absolute 1.8 0.7 - 3.1 x10E3/uL   Monocytes Absolute 0.6 0.1 - 0.9 x10E3/uL   EOS (ABSOLUTE) 0.1 0.0 - 0.4 x10E3/uL   Basophils Absolute 0.0 0.0 - 0.2 x10E3/uL   Immature Granulocytes 0 %   Immature Grans (Abs) 0.0 0.0 - 0.1 x10E3/uL  Vitamin B12  Result Value Ref Range   Vitamin B-12 284 211 - 946 pg/mL      Assessment & Plan:   Problem List Items Addressed This Visit      Digestive   B12 deficiency    Reviewed her labs with her; explained B12 level should really be above 400; B12 injection given today, then weekly x 3 more weeks, then monthly      Relevant Medications   cyanocobalamin ((VITAMIN B-12)) injection 1,000 mcg (Completed)     Other   Vitamin D deficiency disease    encourged patient to start the vitamin D supplementation; reviewed her labs with her today; explained vit D could worsen dry skin, depression, memory; she will pick up Rx at drug store and start that; I'll plan to recheck level in a few months      Thrombocytopenia ([place])    Most recent CBC reveiwed; platelet count back to normal      Major depressive disorder, recurrent, moderate (HCC) - Primary    PHQ-9 score has improved significantly; encoruaged her to work with grief counselor and/or depression counselor, list of resources given, number to Hospice also given today; close f/u in 3 weeks      Relevant Medications   buPROPion (WELLBUTRIN XL) 150 MG 24 hr tablet      Follow up plan: Return weekly x 3 weeks, then monthly, for for B12 injections; 3 weeks.  An after-visit summary was printed and given to the patient at [place].  Please see the patient instructions which may contain other information and recommendations beyond what is mentioned above in the assessment and plan.  Meds ordered this encounter  Medications  . cyanocobalamin ((VITAMIN B-12)) injection 1,000 mcg    Sig:    . buPROPion (WELLBUTRIN XL) 150 MG 24 hr tablet    Sig: Take 1 tablet (150 mg total) by mouth daily.    Dispense:  30 tablet    Refill:  2   Face-to-face time with patient  was more than 25 minutes, >50% time spent counseling and coordination of care

## 2016-02-17 NOTE — Telephone Encounter (Signed)
I talked with patient's daughter; explained vit D and vit B12 low; hope that symptoms will improve after several weeks of replacement; if not improving, then will refer her to neurologist I appreciated the daughter's feedback; I encouraged her to call me if she notices any changes for the worse ----------------------- Christan --  The appt on March 27th should be with me, not just a CMA visit; please change; thank you

## 2016-02-18 ENCOUNTER — Ambulatory Visit: Payer: PPO | Admitting: Family Medicine

## 2016-02-19 ENCOUNTER — Ambulatory Visit: Payer: PPO | Admitting: Family Medicine

## 2016-02-23 ENCOUNTER — Other Ambulatory Visit: Payer: Self-pay | Admitting: Family Medicine

## 2016-02-23 ENCOUNTER — Ambulatory Visit (INDEPENDENT_AMBULATORY_CARE_PROVIDER_SITE_OTHER): Payer: PPO

## 2016-02-23 DIAGNOSIS — E538 Deficiency of other specified B group vitamins: Secondary | ICD-10-CM

## 2016-02-23 MED ORDER — CYANOCOBALAMIN 1000 MCG/ML IJ SOLN
1000.0000 ug | Freq: Once | INTRAMUSCULAR | Status: AC
  Administered 2016-02-23: 1000 ug via INTRAMUSCULAR

## 2016-02-23 NOTE — Assessment & Plan Note (Signed)
Shot today 

## 2016-03-01 ENCOUNTER — Ambulatory Visit: Payer: PPO

## 2016-03-02 ENCOUNTER — Ambulatory Visit: Payer: PPO

## 2016-03-02 DIAGNOSIS — E538 Deficiency of other specified B group vitamins: Secondary | ICD-10-CM

## 2016-03-02 MED ORDER — CYANOCOBALAMIN 1000 MCG/ML IJ SOLN
1000.0000 ug | Freq: Once | INTRAMUSCULAR | Status: AC
  Administered 2016-03-02: 1000 ug via INTRAMUSCULAR

## 2016-03-02 NOTE — Assessment & Plan Note (Signed)
injection

## 2016-03-08 ENCOUNTER — Other Ambulatory Visit: Payer: Self-pay | Admitting: Family Medicine

## 2016-03-08 ENCOUNTER — Ambulatory Visit: Payer: PPO | Admitting: Family Medicine

## 2016-03-08 ENCOUNTER — Ambulatory Visit: Payer: PPO

## 2016-03-08 ENCOUNTER — Ambulatory Visit (INDEPENDENT_AMBULATORY_CARE_PROVIDER_SITE_OTHER): Payer: PPO

## 2016-03-08 DIAGNOSIS — E538 Deficiency of other specified B group vitamins: Secondary | ICD-10-CM

## 2016-03-08 MED ORDER — CYANOCOBALAMIN 1000 MCG/ML IJ SOLN
1000.0000 ug | Freq: Once | INTRAMUSCULAR | Status: AC
  Administered 2016-03-08: 1000 ug via INTRAMUSCULAR

## 2016-03-08 NOTE — Assessment & Plan Note (Signed)
IM today

## 2016-03-10 ENCOUNTER — Encounter: Payer: Self-pay | Admitting: Family Medicine

## 2016-03-10 ENCOUNTER — Ambulatory Visit (INDEPENDENT_AMBULATORY_CARE_PROVIDER_SITE_OTHER): Payer: PPO | Admitting: Family Medicine

## 2016-03-10 VITALS — BP 148/77 | HR 75 | Temp 98.5°F | Wt 122.0 lb

## 2016-03-10 DIAGNOSIS — R413 Other amnesia: Secondary | ICD-10-CM | POA: Insufficient documentation

## 2016-03-10 DIAGNOSIS — F331 Major depressive disorder, recurrent, moderate: Secondary | ICD-10-CM

## 2016-03-10 DIAGNOSIS — E538 Deficiency of other specified B group vitamins: Secondary | ICD-10-CM

## 2016-03-10 DIAGNOSIS — E559 Vitamin D deficiency, unspecified: Secondary | ICD-10-CM | POA: Diagnosis not present

## 2016-03-10 NOTE — Assessment & Plan Note (Addendum)
Refer to neurologist; MMSE reviewed, mild noted on test; B12 deficiency and vit D deficiency and depression all likely playing a role

## 2016-03-10 NOTE — Assessment & Plan Note (Addendum)
Continue B12 shots weekly x 4 weeks total, then once a month; suspect low B12 may impact her mood and memory; next provider may choose to recheck that level in a few months

## 2016-03-10 NOTE — Assessment & Plan Note (Addendum)
Patient has apparently not started Rx supplementation; she will go straight from here across the parking lot to the pharmacy and pick up the Rx; would expect that her new provider may recheck that and her B12 after a few months; suspect low vit D level may be impacting her depression and energy level and memory

## 2016-03-10 NOTE — Patient Instructions (Addendum)
Please do stop by [place] and check with your pharmacist, because you should have refills waiting for you there of the bupropion and vitamin D (ergocalciferol); we want you taking those as directed The bupropion should be one pill once a day The vitamin D (ergocalciferol) should be one pill once a week I am putting in a referral for you to see a psychiatrist Please do start working with a counselor through your psychiatrist's office Please do call Hospice and start working with their grief counselor Address: [address], [place], [place] [address]  Phone: [phone] or Address: [place], [place], [place] [address]  Phone: [phone]

## 2016-03-10 NOTE — Assessment & Plan Note (Addendum)
Refer to psychiatry; patient to check with pharmacist when she leaves here to see about her medicines; she should not be out of medicine; start grief counseling; numbers to Hospice written on the after-visit summary and given to patient; suspect that vit B12 and vit D deficiencies play a small role, in addition to grief over the loss of her son; however, her depression has been chronic for years before the death of her son

## 2016-03-10 NOTE — Progress Notes (Signed)
BP 148/77 mmHg  Pulse 75  Temp(Src) 98.5 F (36.9 C)  Wt 122 lb (55.339 kg)  SpO2 95%   Subjective:    Patient ID: [name], female    DOB: 1934/08/20, 82 y.o.   MRN: [id number]  HPI: GERETHA Carter is a 82 y.o. female  Chief Complaint  Patient presents with  . Depression    follow up  . Memory Loss    follow up  . Other    she hasn't yet picked up the Vit D rx, I reminded her to pick it up   She has not called and make any appointments to see any counselors  Depression screen [facility] 2/9 03/10/2016 02/16/2016 02/05/2016  Decreased Interest 2 1 3   Down, Depressed, Hopeless 3 2 3   PHQ - 2 Score 5 3 6   Altered sleeping 0 0 0  Tired, decreased energy 1 2 2   Change in appetite 1 0 1  Feeling bad or failure about yourself  2 2 3   Trouble concentrating 1 1 2   Moving slowly or fidgety/restless 0 0 0  Suicidal thoughts 0 0 0  PHQ-9 Score 10 8 14   Difficult doing work/chores Somewhat difficult Somewhat difficult Not difficult at all   Patient is here for follow-up of depression and memory She has struggled with depression most of her adult life; she has a twin sister with no depression; no family hx of depression to her knowledge Family issues; she brought up again that her family won't let her drive to [facility] (spelling?) She brought up the death of her son, watching him take his last breath; started going downhill after [name]'s death No thoughts of self-harm She says she ran out of medicine a week ago; we reviewed her medicine list, though, and she was given 30 pills with 2 refills on March 6th, so I pointed out that to patient; she is not sure why she is out; she doesn't think she doubled up on med  Relevant past medical history reviewed and updated as indicated Allergies and medications reviewed and updated.  Review of Systems Per HPI unless specifically indicated above     Objective:    BP 148/77 mmHg  Pulse 75  Temp(Src) 98.5 F (36.9 C)  Wt 122 lb (55.339 kg)   SpO2 95%  Wt Readings from Last 3 Encounters:  03/10/16 122 lb (55.339 kg)  02/16/16 125 lb (56.7 kg)  02/05/16 125 lb 9.6 oz (56.972 kg)    Physical Exam  Constitutional: She appears well-developed and well-nourished.  Cardiovascular: Normal rate and regular rhythm.   Pulmonary/Chest: Effort normal and breath sounds normal.  Neurological: She displays no tremor. Gait normal.  Skin: No pallor.  Psychiatric: She has a normal mood and affect.  Good eye contact with examiner; casually dressed in [employer]chie sweat suit; not tearful, not anxious    Results for orders placed or performed in visit on 02/05/16  TSH  Result Value Ref Range   TSH 0.788 0.450 - 4.500 uIU/mL  VITAMIN D 25 Hydroxy (Vit-D Deficiency, Fractures)  Result Value Ref Range   Vit D, 25-Hydroxy 14.7 (L) 30.0 - 100.0 ng/mL  CBC with Differential/Platelet  Result Value Ref Range   WBC 6.8 3.4 - 10.8 x10E3/uL   RBC 4.79 3.77 - 5.28 x10E6/uL   Hemoglobin 15.0 11.1 - 15.9 g/dL   Hematocrit 43.6 34.0 - 46.6 %   MCV 91 79 - 97 fL   MCH 31.3 26.6 - 33.0 pg  MCHC 34.4 31.5 - 35.7 g/dL   RDW 13.0 12.3 - 15.4 %   Platelets 173 150 - 379 x10E3/uL   Neutrophils 64 %   Lymphs 26 %   Monocytes 8 %   Eos 1 %   Basos 1 %   Neutrophils Absolute 4.3 1.4 - 7.0 x10E3/uL   Lymphocytes Absolute 1.8 0.7 - 3.1 x10E3/uL   Monocytes Absolute 0.6 0.1 - 0.9 x10E3/uL   EOS (ABSOLUTE) 0.1 0.0 - 0.4 x10E3/uL   Basophils Absolute 0.0 0.0 - 0.2 x10E3/uL   Immature Granulocytes 0 %   Immature Grans (Abs) 0.0 0.0 - 0.1 x10E3/uL  Vitamin B12  Result Value Ref Range   Vitamin B-12 284 211 - 946 pg/mL      Assessment & Plan:   Problem List Items Addressed This Visit      Digestive   B12 deficiency    Continue B12 shots weekly x 4 weeks total, then once a month; suspect low B12 may impact her mood and memory; next provider may choose to recheck that level in a few months        Other   Vitamin D deficiency disease    Patient has  apparently not started Rx supplementation; she will go straight from here across the parking lot to the pharmacy and pick up the Rx; would expect that her new provider may recheck that and her B12 after a few months; suspect low vit D level may be impacting her depression and energy level and memory      Major depressive disorder, recurrent, moderate ([place]) - Primary    Refer to psychiatry; patient to check with pharmacist when she leaves here to see about her medicines; she should not be out of medicine; start grief counseling; numbers to Hospice written on the after-visit summary and given to patient; suspect that vit B12 and vit D deficiencies play a small role, in addition to grief over the loss of her son; however, her depression has been chronic for years before the death of her son      Relevant Orders   Ambulatory referral to Psychiatry   Memory impairment    Refer to neurologist; MMSE reviewed, mild noted on test; B12 deficiency and vit D deficiency and depression all likely playing a role      Relevant Orders   Ambulatory referral to Neurology       Follow up plan: Return in about 4 weeks (around 04/07/2016) for B12 shot AND visit with DR. Wynetta Emery.  An after-visit summary was printed and given to the patient at [place].  Please see the patient instructions which may contain other information and recommendations beyond what is mentioned above in the assessment and plan.

## 2016-03-15 ENCOUNTER — Ambulatory Visit: Payer: PPO | Admitting: Family Medicine

## 2016-03-16 ENCOUNTER — Encounter: Payer: Self-pay | Admitting: Family Medicine

## 2016-03-16 ENCOUNTER — Ambulatory Visit (INDEPENDENT_AMBULATORY_CARE_PROVIDER_SITE_OTHER): Payer: PPO | Admitting: Family Medicine

## 2016-03-16 VITALS — BP 146/82 | HR 82 | Temp 98.9°F | Ht 61.4 in | Wt 120.0 lb

## 2016-03-16 DIAGNOSIS — A5909 Other urogenital trichomoniasis: Secondary | ICD-10-CM

## 2016-03-16 DIAGNOSIS — N3000 Acute cystitis without hematuria: Secondary | ICD-10-CM | POA: Diagnosis not present

## 2016-03-16 DIAGNOSIS — A5903 Trichomonal cystitis and urethritis: Secondary | ICD-10-CM

## 2016-03-16 DIAGNOSIS — R410 Disorientation, unspecified: Secondary | ICD-10-CM | POA: Diagnosis not present

## 2016-03-16 MED ORDER — NITROFURANTOIN MONOHYD MACRO 100 MG PO CAPS: 100.0000 mg | ORAL_CAPSULE | Freq: Two times a day (BID) | ORAL | Status: DC

## 2016-03-16 MED ORDER — METRONIDAZOLE 500 MG PO TABS: 500.0000 mg | ORAL_TABLET | Freq: Two times a day (BID) | ORAL | Status: DC

## 2016-03-16 MED ORDER — CIPROFLOXACIN HCL 250 MG PO TABS: 250.0000 mg | ORAL_TABLET | Freq: Two times a day (BID) | ORAL | Status: DC

## 2016-03-16 NOTE — Progress Notes (Signed)
BP 146/82 mmHg  Pulse 82  Temp(Src) 98.9 F (37.2 C)  Ht 5' 1.4" (1.56 m)  Wt 120 lb (54.432 kg)  BMI 22.37 kg/m2  SpO2 98%   Subjective:    Patient ID: [name], female    DOB: 02/15/1934, 82 y.o.   MRN: [id number]  HPI: [name] is a 82 y.o. female  Chief Complaint  Patient presents with  . Memory Loss    Over the last week patient has been asking when her deceased husband is coming home and where her son is that is also deceased.   My first time seeing patient, was following with my partner who recently left the practice. Last seen in out office 03/10/16. Have been working on depression and memory loss. Notes reviewed. Has been having some issues taking her medication properly. Was to see pharmacist to find out what was going on with her medicine. Has been depressed most of her life, significantly worse since her son passed away, referred last visit to psychiatry. Has B12 deficiency and was just started on B12 shots. Also vitamin D deficiency. Had not picked up her Rx for vitamin D at last visit. Having issues with memory loss- referral to neurology made last visit. Has an appointment with [facility] on 03/25/16.  Has been calling her son talking about a dog that has been gone a couple of years. Was asking about her son who passed away in Aug 09, 2023. Then called about her husband asking about when he was coming home, he's been gone since 2006. She notes that when she is more anxious she starts forgetting more things. She has been very anxious the past couple of days, came on very suddenly, no warning, significantly worse than it had been. Otherwise feeling well with no other concerns or complaints.   Has been taking her medication, was able to pick it up. Has not been taking her vitamin D because she didn't pick it up. No other concerns or complaints at this time.   Relevant past medical, surgical, family and social history reviewed and updated as indicated. Interim medical  history since our last visit reviewed. Allergies and medications reviewed and updated.  Review of Systems  Constitutional: Positive for fatigue. Negative for fever, chills, diaphoresis, activity change, appetite change and unexpected weight change.  Respiratory: Negative.   Cardiovascular: Negative.   Genitourinary: Negative for dysuria, urgency, frequency, hematuria, flank pain, decreased urine volume, vaginal bleeding, vaginal discharge, enuresis, difficulty urinating, genital sores, vaginal pain, menstrual problem, pelvic pain and dyspareunia.  Psychiatric/Behavioral: Positive for confusion and dysphoric mood. Negative for suicidal ideas, hallucinations, behavioral problems, sleep disturbance, self-injury, decreased concentration and agitation. The patient is nervous/anxious. The patient is not hyperactive.     Per HPI unless specifically indicated above     Objective:    BP 146/82 mmHg  Pulse 82  Temp(Src) 98.9 F (37.2 C)  Ht 5' 1.4" (1.56 m)  Wt 120 lb (54.432 kg)  BMI 22.37 kg/m2  SpO2 98%  Wt Readings from Last 3 Encounters:  03/16/16 120 lb (54.432 kg)  03/10/16 122 lb (55.339 kg)  02/16/16 125 lb (56.7 kg)    Physical Exam  Constitutional: She is oriented to person, place, and time. She appears well-developed and well-nourished. No distress.  HENT:  Head: Normocephalic and atraumatic.  Right Ear: Hearing normal.  Left Ear: Hearing normal.  Nose: Nose normal.  Eyes: Conjunctivae and lids are normal. Right eye exhibits no discharge. Left eye exhibits no discharge.  No scleral icterus.  Cardiovascular: Normal rate, regular rhythm, normal heart sounds and intact distal pulses.  Exam reveals no gallop and no friction rub.   No murmur heard. Pulmonary/Chest: Effort normal and breath sounds normal. No respiratory distress. She has no wheezes. She has no rales. She exhibits no tenderness.  Abdominal: Soft. Bowel sounds are normal. She exhibits no distension and no mass. There  is no tenderness. There is no rebound and no guarding.  Musculoskeletal: Normal range of motion.  Neurological: She is alert and oriented to person, place, and time.  Skin: Skin is warm, dry and intact. No rash noted. She is not diaphoretic. No erythema. No pallor.  Psychiatric: She has a normal mood and affect. Her speech is normal and behavior is normal. Judgment and thought content normal. Cognition and memory are normal.  Nursing note and vitals reviewed.   Results for orders placed or performed in visit on 02/05/16  TSH  Result Value Ref Range   TSH 0.788 0.450 - 4.500 uIU/mL  VITAMIN D 25 Hydroxy (Vit-D Deficiency, Fractures)  Result Value Ref Range   Vit D, 25-Hydroxy 14.7 (L) 30.0 - 100.0 ng/mL  CBC with Differential/Platelet  Result Value Ref Range   WBC 6.8 3.4 - 10.8 x10E3/uL   RBC 4.79 3.77 - 5.28 x10E6/uL   Hemoglobin 15.0 11.1 - 15.9 g/dL   Hematocrit 43.6 34.0 - 46.6 %   MCV 91 79 - 97 fL   MCH 31.3 26.6 - 33.0 pg   MCHC 34.4 31.5 - 35.7 g/dL   RDW 13.0 12.3 - 15.4 %   Platelets 173 150 - 379 x10E3/uL   Neutrophils 64 %   Lymphs 26 %   Monocytes 8 %   Eos 1 %   Basos 1 %   Neutrophils Absolute 4.3 1.4 - 7.0 x10E3/uL   Lymphocytes Absolute 1.8 0.7 - 3.1 x10E3/uL   Monocytes Absolute 0.6 0.1 - 0.9 x10E3/uL   EOS (ABSOLUTE) 0.1 0.0 - 0.4 x10E3/uL   Basophils Absolute 0.0 0.0 - 0.2 x10E3/uL   Immature Granulocytes 0 %   Immature Grans (Abs) 0.0 0.0 - 0.1 x10E3/uL  Vitamin B12  Result Value Ref Range   Vitamin B-12 284 211 - 946 pg/mL      Assessment & Plan:   Problem List Items Addressed This Visit    None    Visit Diagnoses    Acute cystitis without hematuria    -  Primary    +UA, will treat with cipro as nitrofurantoin not covered. Call if not getting better or getting worse. Continue to monitor.     Trichomoniasis of bladder        Not sexually active. Will treat with metronidazole. Call with any problems.     Relevant Medications    metroNIDAZOLE  (FLAGYL) 500 MG tablet    Confusion        Seems to be due to UTI. Will treat. See neurology next week. Call with any concerns.     Relevant Orders    UA/M w/rflx Culture, Routine        Follow up plan: Return As scheduled.

## 2016-03-17 LAB — UA/M W/RFLX CULTURE, ROUTINE
BILIRUBIN UA: NEGATIVE
GLUCOSE, UA: NEGATIVE
KETONES UA: NEGATIVE
Nitrite, UA: NEGATIVE
Protein, UA: NEGATIVE
SPEC GRAV UA: 1.02 (ref 1.005–1.030)
Urobilinogen, Ur: 0.2 mg/dL (ref 0.2–1.0)
pH, UA: 5 (ref 5.0–7.5)

## 2016-03-17 LAB — URINE CULTURE, REFLEX

## 2016-03-17 LAB — MICROSCOPIC EXAMINATION: Epithelial Cells (non renal): >10 /hpf — AB (ref 0–10)

## 2016-03-19 ENCOUNTER — Telehealth: Payer: Self-pay | Admitting: Family Medicine

## 2016-03-19 NOTE — Telephone Encounter (Signed)
Please let her know that her urine culture showed a mix of bacteria as well as the parasite we were treating her for. She should finish her antibiotics, and it may take a little longer for her to feel better. This also may just be some dementia- that is why she is going to the neurologist next week. If she still needs to talk to me, let me know.

## 2016-03-19 NOTE — Telephone Encounter (Signed)
Forward to provider

## 2016-03-19 NOTE — Telephone Encounter (Signed)
Patient's daughter notified.

## 2016-03-19 NOTE — Telephone Encounter (Signed)
Pt's daughter called stated pt is still confused. Pt's daughter would like to know if the culture came back to confirm pt has a UTI. Please call pt's daughter ASAP. Thanks.

## 2016-03-24 ENCOUNTER — Telehealth: Payer: Self-pay | Admitting: Family Medicine

## 2016-03-24 DIAGNOSIS — R410 Disorientation, unspecified: Secondary | ICD-10-CM

## 2016-03-24 NOTE — Telephone Encounter (Signed)
Pts daughter concerned about pt's ongoing memory issues.  States pt was not taking her meds correctly.  Would like Dr Wynetta Emery of [facility] to give her a call.

## 2016-03-24 NOTE — Telephone Encounter (Signed)
Spoke with Dr.Johnson, and patient's daughter, she will bring patient back in for a repeat UA

## 2016-03-25 ENCOUNTER — Telehealth: Payer: Self-pay | Admitting: Family Medicine

## 2016-03-25 ENCOUNTER — Ambulatory Visit: Payer: Self-pay | Admitting: Neurology

## 2016-03-25 ENCOUNTER — Other Ambulatory Visit: Payer: PPO

## 2016-03-25 DIAGNOSIS — R410 Disorientation, unspecified: Secondary | ICD-10-CM

## 2016-03-25 LAB — UA/M W/RFLX CULTURE, ROUTINE
Bilirubin, UA: NEGATIVE
Glucose, UA: NEGATIVE
KETONES UA: NEGATIVE
Leukocytes, UA: NEGATIVE
NITRITE UA: NEGATIVE
PH UA: 5.5 (ref 5.0–7.5)
Protein, UA: NEGATIVE
RBC UA: NEGATIVE
Specific Gravity, UA: 1.015 (ref 1.005–1.030)
Urobilinogen, Ur: 0.2 mg/dL (ref 0.2–1.0)

## 2016-03-25 NOTE — Telephone Encounter (Signed)
Please let her daughter know that her urine was nice and clear. No sign of infection.

## 2016-03-25 NOTE — Telephone Encounter (Signed)
Patient's daughter notified.

## 2016-03-29 ENCOUNTER — Ambulatory Visit: Payer: PPO | Admitting: Family Medicine

## 2016-04-02 ENCOUNTER — Ambulatory Visit: Payer: PPO | Admitting: Family Medicine

## 2016-04-07 ENCOUNTER — Ambulatory Visit (INDEPENDENT_AMBULATORY_CARE_PROVIDER_SITE_OTHER): Payer: PPO | Admitting: Family Medicine

## 2016-04-07 ENCOUNTER — Encounter: Payer: Self-pay | Admitting: Family Medicine

## 2016-04-07 VITALS — BP 133/66 | HR 78 | Temp 98.3°F | Ht 61.4 in | Wt 120.0 lb

## 2016-04-07 DIAGNOSIS — R413 Other amnesia: Secondary | ICD-10-CM

## 2016-04-07 DIAGNOSIS — F331 Major depressive disorder, recurrent, moderate: Secondary | ICD-10-CM

## 2016-04-07 DIAGNOSIS — E538 Deficiency of other specified B group vitamins: Secondary | ICD-10-CM

## 2016-04-07 MED ORDER — CYANOCOBALAMIN 1000 MCG/ML IJ KIT: 1000.0000 ug | PACK | INTRAMUSCULAR | Status: DC

## 2016-04-07 MED ORDER — BUPROPION HCL ER (XL) 300 MG PO TB24: 300.0000 mg | ORAL_TABLET | Freq: Every day | ORAL | Status: DC

## 2016-04-07 NOTE — Assessment & Plan Note (Signed)
B12 shot given today. Return in 1 month for B12.

## 2016-04-07 NOTE — Patient Instructions (Signed)
2 of the wellbutrin in the AM until you run out, then you can start the new prescription for 300mg  daily.

## 2016-04-07 NOTE — Progress Notes (Signed)
BP 133/66 mmHg  Pulse 78  Temp(Src) 98.3 F (36.8 C)  Ht 5' 1.4" (1.56 m)  Wt 120 lb (54.432 kg)  BMI 22.37 kg/m2  SpO2 98%   Subjective:    Patient ID: [name], female    DOB: May 22, 1934, 82 y.o.   MRN: [id number]  HPI: [name] is a 82 y.o. female  Chief Complaint  Patient presents with  . Depression  . B 12 defiency    Unsure if patient is due for a B 12 injection or not   Confusion- was to see neurology 2 weeks ago, but they cancelled their appointment. Now scheduled to see them on 04/13/16. History limited as patient is here alone and is not a good historian.  DEPRESSION Mood status: uncontrolled Satisfied with current treatment?: no Symptom severity: moderate  Duration of current treatment : about a month Side effects: no Medication compliance: good compliance Psychotherapy/counseling: yes in the past Previous psychiatric medications: wellbutrin Depressed mood: yes Anxious mood: no Anhedonia: yes Significant weight loss or gain: no Insomnia: no  Fatigue: yes Feelings of worthlessness or guilt: yes Impaired concentration/indecisiveness: yes Suicidal ideations: no Hopelessness: yes Crying spells: yes Depression screen [facility] 2/9 04/07/2016 03/10/2016 02/16/2016 02/05/2016  Decreased Interest 1 2 1 3   Down, Depressed, Hopeless 2 3 2 3   PHQ - 2 Score 3 5 3 6   Altered sleeping 0 0 0 0  Tired, decreased energy 2 1 2 2   Change in appetite 2 1 0 1  Feeling bad or failure about yourself  3 2 2 3   Trouble concentrating 1 1 1 2   Moving slowly or fidgety/restless 1 0 0 0  Suicidal thoughts 0 0 0 0  PHQ-9 Score 12 10 8 14   Difficult doing work/chores Somewhat difficult Somewhat difficult Somewhat difficult Not difficult at all   Due for her B12 shot today. Ordered for her to get monthly for 6 months.   Relevant past medical, surgical, family and social history reviewed and updated as indicated. Interim medical history since our last visit reviewed. Allergies and  medications reviewed and updated.  Review of Systems  Constitutional: Negative.   Respiratory: Negative.   Cardiovascular: Negative.   Psychiatric/Behavioral: Negative.     Per HPI unless specifically indicated above     Objective:    BP 133/66 mmHg  Pulse 78  Temp(Src) 98.3 F (36.8 C)  Ht 5' 1.4" (1.56 m)  Wt 120 lb (54.432 kg)  BMI 22.37 kg/m2  SpO2 98%  Wt Readings from Last 3 Encounters:  04/07/16 120 lb (54.432 kg)  03/16/16 120 lb (54.432 kg)  03/10/16 122 lb (55.339 kg)    Physical Exam  Constitutional: She is oriented to person, place, and time. She appears well-developed and well-nourished. No distress.  HENT:  Head: Normocephalic and atraumatic.  Right Ear: Hearing normal.  Left Ear: Hearing normal.  Nose: Nose normal.  Eyes: Conjunctivae and lids are normal. Right eye exhibits no discharge. Left eye exhibits no discharge. No scleral icterus.  Cardiovascular: Normal rate, regular rhythm, normal heart sounds and intact distal pulses.  Exam reveals no gallop and no friction rub.   No murmur heard. Pulmonary/Chest: Effort normal and breath sounds normal. No respiratory distress. She has no wheezes. She has no rales. She exhibits no tenderness.  Musculoskeletal: Normal range of motion.  Neurological: She is alert and oriented to person, place, and time.  Skin: Skin is warm, dry and intact. No rash noted. No erythema. No pallor.  Psychiatric: She has a normal mood and affect. Her speech is normal and behavior is normal. Judgment and thought content normal. Cognition and memory are impaired.  Nursing note and vitals reviewed.   Results for orders placed or performed in visit on 03/25/16  UA/M w/rflx Culture, Routine  Result Value Ref Range   Specific Gravity, UA 1.015 1.005 - 1.030   pH, UA 5.5 5.0 - 7.5   Color, UA Yellow Yellow   Appearance Ur Cloudy (A) Clear   Leukocytes, UA Negative Negative   Protein, UA Negative Negative/Trace   Glucose, UA Negative  Negative   Ketones, UA Negative Negative   RBC, UA Negative Negative   Bilirubin, UA Negative Negative   Urobilinogen, Ur 0.2 0.2 - 1.0 mg/dL   Nitrite, UA Negative Negative      Assessment & Plan:   Problem List Items Addressed This Visit      Digestive   B12 deficiency    B12 shot given today. Return in 1 month for B12.      Relevant Medications   Cyanocobalamin KIT 1,000 mcg     Other   Major depressive disorder, recurrent, moderate ([facility]) - Primary   Relevant Medications   buPROPion (WELLBUTRIN XL) 300 MG 24 hr tablet   Memory impairment    Strongly encouraged patient to follow up with neurology for evaluation on May 2.          Follow up plan: Return in about 4 weeks (around 05/05/2016) for recheck mood.

## 2016-04-07 NOTE — Assessment & Plan Note (Signed)
Strongly encouraged patient to follow up with neurology for evaluation on May 2.

## 2016-04-08 MED ORDER — CYANOCOBALAMIN 1000 MCG/ML IJ SOLN
1000.0000 ug | INTRAMUSCULAR | Status: DC
Start: 2016-04-08 — End: 2016-05-03
  Administered 2016-04-07: 1000 ug via INTRAMUSCULAR

## 2016-04-08 NOTE — Addendum Note (Signed)
Addended by: Valerie Roys on: 04/08/2016 08:19 AM   Modules accepted: Orders

## 2016-04-13 ENCOUNTER — Ambulatory Visit: Payer: Self-pay | Admitting: Neurology

## 2016-04-19 ENCOUNTER — Other Ambulatory Visit: Payer: PPO

## 2016-04-19 ENCOUNTER — Other Ambulatory Visit: Payer: Self-pay | Admitting: Family Medicine

## 2016-04-19 ENCOUNTER — Telehealth: Payer: Self-pay | Admitting: Family Medicine

## 2016-04-19 DIAGNOSIS — R410 Disorientation, unspecified: Secondary | ICD-10-CM

## 2016-04-19 LAB — UA/M W/RFLX CULTURE, ROUTINE
Bilirubin, UA: NEGATIVE
GLUCOSE, UA: NEGATIVE
KETONES UA: NEGATIVE
LEUKOCYTES UA: NEGATIVE
Nitrite, UA: NEGATIVE
Protein, UA: NEGATIVE
RBC, UA: NEGATIVE
SPEC GRAV UA: 1.02 (ref 1.005–1.030)
UUROB: 0.2 mg/dL (ref 0.2–1.0)
pH, UA: 5 (ref 5.0–7.5)

## 2016-04-19 NOTE — Telephone Encounter (Signed)
Please let her daughter know that there is no sign of any infection in her urine. She should see the neurologist as soon as she can.

## 2016-04-19 NOTE — Telephone Encounter (Signed)
Patient's daughter notified.

## 2016-05-03 ENCOUNTER — Ambulatory Visit (INDEPENDENT_AMBULATORY_CARE_PROVIDER_SITE_OTHER): Payer: PPO | Admitting: Neurology

## 2016-05-03 ENCOUNTER — Other Ambulatory Visit: Payer: PRIVATE HEALTH INSURANCE

## 2016-05-03 ENCOUNTER — Encounter: Payer: Self-pay | Admitting: Neurology

## 2016-05-03 VITALS — BP 120/82 | HR 85 | Ht 64.0 in | Wt 117.0 lb

## 2016-05-03 DIAGNOSIS — F0280 Dementia in other diseases classified elsewhere without behavioral disturbance: Secondary | ICD-10-CM

## 2016-05-03 DIAGNOSIS — G301 Alzheimer's disease with late onset: Secondary | ICD-10-CM | POA: Diagnosis not present

## 2016-05-03 DIAGNOSIS — R413 Other amnesia: Secondary | ICD-10-CM

## 2016-05-03 DIAGNOSIS — E538 Deficiency of other specified B group vitamins: Secondary | ICD-10-CM | POA: Diagnosis not present

## 2016-05-03 LAB — FOLATE: Folate: 9 ng/mL (ref >5.4)

## 2016-05-03 LAB — VITAMIN B12: Vitamin B-12: 534 pg/mL (ref 200–1100)

## 2016-05-03 MED ORDER — DONEPEZIL HCL 5 MG PO TABS: 5.0000 mg | ORAL_TABLET | Freq: Every day | ORAL | Status: DC

## 2016-05-03 MED ORDER — DONEPEZIL HCL 10 MG PO TABS: 10.0000 mg | ORAL_TABLET | Freq: Every day | ORAL | Status: DC

## 2016-05-03 NOTE — Progress Notes (Signed)
The patient is seen in neurologic consultation at the request of Dr. Sanda Klein for the evaluation of memory.  The patient is accompanied by her daughter and son who supplement the history.  The patient is a 82 y.o. year old female who has had memory issues for about 6 months.  She noted that she would have to write things down.   Memory loss apparently got worse after the death of her son last 07-28-23 (who had downs syndrome).  She was referred to psychiatry for MDD in 02/2016 but they state that for some reason, an appt wasn't made.  Her family notes that she has called them multiple times a day for the last 6 weeks and asked them when her husband was coming home but he has been dead for 6 years.  The patient lives alone with her dog and she is able to take care of her dog.   The patient does do the finances in the home and reports that she has no trouble with that.  The patient does drive and she reports that she has no difficulty with that but family reports that they haven't driven with her.  There have not been any motor vehicle accidents in the recent years.  The patient does cook.  There is no difficulty remembering common recipes.  Her cooking still tastes good.    The stovetop has not been left on accidentally.  The patient is able to perform her own ADL's.  The patient is able to distribute her own medications but states that she is only on a vitamin and antidepressant but her daughter does report that she was on a medication for UTI and she did get confused with that.  The patients bladder and bowel are under good control.  There have been no behavioral changes over the years.  There have been no hallucinations.  Fam hx of memory issue in her mother.  Allergies: No Known Allergies  Current Outpatient Prescriptions on File Prior to Visit  Medication Sig Dispense Refill  . aspirin EC 81 MG tablet Take 81 mg by mouth daily.    Marland Kitchen buPROPion (WELLBUTRIN XL) 300 MG 24 hr tablet Take 1 tablet (300 mg  total) by mouth daily. 300 tablet 2  . cyanocobalamin (,VITAMIN B-12,) 1000 MCG/ML injection Inject 1 mL (1,000 mcg total) into the muscle once. Once a week for four weeks, then once a month; valid through the end of September 2017 (Patient taking differently: Inject 1,000 mcg into the muscle every 30 (thirty) days. ) 1 mL 0   No current facility-administered medications on file prior to visit.     Past Medical History  Diagnosis Date  . Dizziness   . Depression   . Hx of migraine headaches   . Vitamin D deficiency disease   . Hypertension   . RBBB   . Thrombocytopenia ([place])   . Heart murmur     Past Surgical History  Procedure Laterality Date  . Appendectomy    . Abdominal hysterectomy    . Cholecystectomy    . Cataract extraction, bilateral      Social History   Social History  . Marital Status: Unknown    Spouse Name: N/A  . Number of Children: N/A  . Years of Education: N/A   Occupational History  . retired     Dance movement psychotherapist   Social History Main Topics  . Smoking status: Former Smoker -- 0.50 packs/day for 15 years    Types: Cigarettes  Quit date: 12/14/2007  . Smokeless tobacco: Never Used  . Alcohol Use: 0.0 oz/week    0 Standard drinks or equivalent per week     Comment: glass of wine every couple weeks  . Drug Use: No  . Sexual Activity: Not on file   Other Topics Concern  . Not on file   Social History Narrative    Family Status  Relation Status Death Age  . Mother Deceased     heart disease  . Father Deceased     unknown  . Sister Alive     atrial fib, rheumatoid  . Sister Alive     unknown  . Brother Alive     unknown  . Daughter Alive     healthy  . Son Alive     healthy  . Son Deceased     down syndrome    ROS:  A complete 10 system ROS was obtained and was unremarkable except as above.   VITALS:   Filed Vitals:   05/03/16 1245  BP: 120/82  Pulse: 85  Height: 5\' 4"  (1.626 m)  Weight: 117 lb (53.071 kg)   HEENT:   Normocephalic, atraumatic. The mucous membranes are moist. The superficial temporal arteries are without ropiness or tenderness. Cardiovascular: Regular rate and rhythm. Lungs: Clear to auscultation bilaterally. Neck: There are no carotid bruits noted bilaterally.  NEUROLOGICAL:  Orientation:   Montreal Cognitive Assessment  05/03/2016  Visuospatial/ Executive (0/5) 2  Naming (0/3) 2  Attention: Read list of digits (0/2) 2  Attention: Read list of letters (0/1) 1  Attention: Serial 7 subtraction starting at 100 (0/3) 1  Language: Repeat phrase (0/2) 2  Language : Fluency (0/1) 0  Abstraction (0/2) 2  Delayed Recall (0/5) 0  Orientation (0/6) 3  Total 15  Adjusted Score (based on education) 15    Cranial nerves: There is good facial symmetry. The pupils are equal round and reactive to light bilaterally. Fundoscopic exam is attempted but the disc margins are not well visualized bilaterally. Extraocular muscles are intact and visual fields are full to confrontational testing. Speech is fluent and clear. Soft palate rises symmetrically and there is no tongue deviation. Hearing is intact to conversational tone. Tone: Tone is good throughout. Sensation: Sensation is intact to light touch and pinprick throughout. Vibration is decreased at the bilateral big toe and ankle. There is no extinction with double simultaneous stimulation. There is no sensory dermatomal level identified. Coordination:  The patient has no difficulty with RAM's or FNF bilaterally. Motor: Strength is 5/5 in the bilateral upper and lower extremities. There is no pronator drift.  There are no fasciculations noted. DTR's: Deep tendon reflexes are 2/4 at the bilateral biceps, triceps, brachioradialis, patella and achilles.  Plantar responses are downgoing bilaterally. Gait and Station: The patient is able to ambulate without difficulty. The patient is able to heel toe walk without any difficulty. The patient has mild trouble  ambulating in a tandem fashion. The patient is able to stand in the Romberg position. Frontal release:  Positive palmomental and glabellar tap.    LABS:    Chemistry      Component Value Date/Time   NA 141 03/06/2012 1912   K 3.5 03/06/2012 1912   CL 104 03/06/2012 1912   CO2 25 03/06/2012 1912   BUN 17 03/06/2012 1912   CREATININE 0.58* 03/06/2012 1912      Component Value Date/Time   CALCIUM 9.0 03/06/2012 1912   ALKPHOS 144* 03/06/2012 1912  AST 38* 03/06/2012 1912   ALT 30 03/06/2012 1912   BILITOT 2.1* 03/06/2012 1912     Lab Results  Component Value Date   VITAMINB12 284 02/05/2016   IMPRESSIONS/PLAN:  1.  Memory Loss  -Long discussion with the patient and family today.  The patients constellation of symptoms along with physical examination findings strongly favors a diagnosis of Alzheimers dementia.  We will schedule for neuropsych testing.  I suspect that things are made worse by depression after the death of her son.  We discussed diagnosis, pathophysiology and prognosis.  We discussed community resources for patient and caregiver.  We discussed medications and the fact that they do not prevent memory loss nor do they halt it.  We discussed expectations with memory medications and discusses choices of medications and ultimately decided on Aricept, 5 mg and will work to 10 mg daily.  Risks, benefits, side effects and alternative therapies were discussed.  The opportunity to ask questions was given and they were answered to the best of my ability.  The patient expressed understanding and willingness to follow the outlined treatment protocols.  -We discussed the importance of physical and mental exercises and I explained to the patient and family what this means.    -Pt should not be driving.  She is very resistant to that and I gave them the number to the [employer].    -MRI brain will be done.  Told her that I expect to see small vessel disease and atrophy and  want to make sure that not missing anything else.   2.  B12 deficiency  -she has already started B12 injections.  Will recheck level today along with folate, rpr, spep/upep (evidence of PN on exam)  3.  Follow up is anticipated in the next few months, sooner should new neurologic issues arise.  Much greater than 50% of this visit was spent in counseling and coordinating care.  Total face to face time:  65 min

## 2016-05-03 NOTE — Patient Instructions (Addendum)
1. We will call you with an appt for neuro-psychological testing with Dr Si Raider in our office.  2. Do not drive without a driving assessment. If you are interested in the driving assessment, you can contact [employer] in [place]. [phone]. 3. Your provider has requested that you have labwork completed today. Please go to [facility] ([id number]) on the second floor of this building before leaving the office today. You do not need to check in. If you are not called within 15 minutes please check with the front desk.  4. Start Aricept 5 mg tablets - one daily for a month then switch to 10 mg tablets daily.  5. We have scheduled you at [facility] for your MRI on 05/11/2016 at 4:00 pm. Please arrive 30 minutes prior and go to [facility]. If you need to change this appt please call [phone].

## 2016-05-04 ENCOUNTER — Telehealth: Payer: Self-pay | Admitting: Neurology

## 2016-05-04 LAB — RPR

## 2016-05-04 NOTE — Telephone Encounter (Signed)
Patient made aware.

## 2016-05-04 NOTE — Telephone Encounter (Signed)
-----   Message from [place], DO sent at 05/04/2016  7:28 AM EDT ----- Let pt/daughter know that labs looked ok.  B12 better now that on injections

## 2016-05-06 ENCOUNTER — Other Ambulatory Visit: Payer: Self-pay | Admitting: Neurology

## 2016-05-07 ENCOUNTER — Ambulatory Visit (INDEPENDENT_AMBULATORY_CARE_PROVIDER_SITE_OTHER): Payer: PPO | Admitting: Family Medicine

## 2016-05-07 ENCOUNTER — Encounter: Payer: Self-pay | Admitting: Family Medicine

## 2016-05-07 VITALS — BP 138/76 | HR 81 | Temp 98.9°F | Wt 115.0 lb

## 2016-05-07 DIAGNOSIS — R413 Other amnesia: Secondary | ICD-10-CM | POA: Diagnosis not present

## 2016-05-07 DIAGNOSIS — E538 Deficiency of other specified B group vitamins: Secondary | ICD-10-CM | POA: Diagnosis not present

## 2016-05-07 MED ORDER — CYANOCOBALAMIN 1000 MCG/ML IJ SOLN
1000.0000 ug | INTRAMUSCULAR | Status: DC
  Administered 2016-05-07 – 2016-06-08 (×2): 1000 ug via INTRAMUSCULAR

## 2016-05-07 NOTE — Assessment & Plan Note (Signed)
Continue to follow with neurology. Aricept at night. Advised patient that she cannot be driving. She was very upset about this and states that no one told her. We told her again about the driving evaluator, but that until she does that she cannot drive. Daughter called and came to pick her up.

## 2016-05-07 NOTE — Assessment & Plan Note (Signed)
B12 given again today. Continue monthly shots, and recheck in September.

## 2016-05-07 NOTE — Patient Instructions (Signed)
Vitamin B12 Deficiency Not having enough vitamin B12 is called a deficiency. Vitamin B12 is an important vitamin. Your body needs vitamin B12 to:   Make red blood cells.  Make DNA. This is the genetic material inside all of your cells.  Help your nerves work properly so they can carry messages from your brain to your body. CAUSES  Not eating enough foods that contain vitamin B12.  Not having enough stomach acid and digestive juices. The body needs these to absorb vitamin B12 from the food you eat.  Having certain digestive system diseases that make it hard to absorb vitamin B12. These diseases include Crohn's disease, chronic pancreatitis, and cystic fibrosis.  Having pernicious anemia, which is a condition where the body has too few red blood cells. People with this condition do not make enough of a protein called "intrinsic factor," which is needed to absorb vitamin B12.  Having a surgery in which part of the stomach or small intestine is removed.  Taking certain medicines that make it hard for the body to absorb vitamin B12. These medicines include:  Heartburn medicine (antacids and proton pump inhibitors).  A certain antibiotic medicine called neomycin, which fights infection.  Some medicines used to treat diabetes, tuberculosis, gout, and high cholesterol. RISK FACTORS Risk factors are things that make you more likely to develop a vitamin B12 deficiency. They include:  Being older than 50.  Being a vegetarian.  Being pregnant and a vegetarian or having a poor diet.  Taking certain drugs.  Being an alcoholic. SYMPTOMS You may have a vitamin B12 deficiency with no symptoms. However, a vitamin B12 deficiency can cause health problems like anemia and nerve damage. These health problems can lead to many possible symptoms, including:  Weakness.  Fatigue.  Loss of appetite.  Weight loss.  Numbness or tingling in your hands and feet.  Redness and burning of the  tongue.  Confusion or memory problems.  Depression.  Dizziness.  Sensory problems, such as loss of taste, color blindness, and ringing in the ears.  Diarrhea or constipation.  Trouble walking. DIAGNOSIS Various types of tests can be given to help find the cause of your vitamin B12 deficiency. These tests include:  A complete blood count (CBC). This test gives your caregiver an overall picture of what makes up your blood.  A blood test to measure your B12 level.  A blood test to measure intrinsic factor.  An endoscopy. This procedure uses a thin tube with a camera on the end to look into your stomach or intestines. TREATMENT Treatment for vitamin B12 deficiency depends on what is causing it. Common options include:  Changing your eating and drinking habits, such as:  Eating more foods that contain vitamin B12.  Not drinking as much alcohol or any alcohol.  Taking vitamin B12 supplements. Your caregiver will tell you what dose is best for you.  Getting vitamin B12 injections. Some people get these a few times a week. Others get them once a month. HOME CARE INSTRUCTIONS  Take all supplements as directed by your caregiver. Follow the directions carefully.  Get any injections your caregiver prescribes. Do not miss your appointments.  Eat lots of healthy foods that contain vitamin B12. Ask your caregiver if you should work with a nutritionist. Good things to include in your diet are:  Meat.  Poultry.  Fish.  Eggs.  Fortified cereal and dairy products. This means vitamin B12 has been added to the food. Check the label on the   package to be sure.  Do not abuse alcohol.  Keep all follow-up appointments. Your caregiver will need to perform blood tests to make sure your vitamin B12 deficiency is going away. SEEK MEDICAL CARE IF:  You have any questions about your treatment.  Your symptoms come back. MAKE SURE YOU:  Understand these instructions.  Will watch your  condition.  Will get help right away if you are not doing well or get worse.   This information is not intended to replace advice given to you by your health care provider. Make sure you discuss any questions you have with your health care provider.   Document Released: 02/21/2012 Document Reviewed: 04/16/2015 Elsevier Interactive Patient Education 2016 [place], Vitamin B12 injection What is this medicine? CYANOCOBALAMIN (sye an oh koe BAL a min) is a man made form of vitamin B12. Vitamin B12 is used in the growth of healthy blood cells, nerve cells, and proteins in the body. It also helps with the metabolism of fats and carbohydrates. This medicine is used to treat people who can not absorb vitamin B12. This medicine may be used for other purposes; ask your health care provider or pharmacist if you have questions. What should I tell my health care provider before I take this medicine? They need to know if you have any of these conditions: -kidney disease -Leber's disease -megaloblastic anemia -an unusual or allergic reaction to cyanocobalamin, cobalt, other medicines, foods, dyes, or preservatives -pregnant or trying to get pregnant -breast-feeding How should I use this medicine? This medicine is injected into a muscle or deeply under the skin. It is usually given by a health care professional in a clinic or doctor's office. However, your doctor may teach you how to inject yourself. Follow all instructions. Talk to your pediatrician regarding the use of this medicine in children. Special care may be needed. Overdosage: If you think you have taken too much of this medicine contact a poison control center or emergency room at once. NOTE: This medicine is only for you. Do not share this medicine with others. What if I miss a dose? If you are given your dose at a clinic or doctor's office, call to reschedule your appointment. If you give your own injections and you miss a  dose, take it as soon as you can. If it is almost time for your next dose, take only that dose. Do not take double or extra doses. What may interact with this medicine? -colchicine -heavy alcohol intake This list may not describe all possible interactions. Give your health care provider a list of all the medicines, herbs, non-prescription drugs, or dietary supplements you use. Also tell them if you smoke, drink alcohol, or use illegal drugs. Some items may interact with your medicine. What should I watch for while using this medicine? Visit your doctor or health care professional regularly. You may need blood work done while you are taking this medicine. You may need to follow a special diet. Talk to your doctor. Limit your alcohol intake and avoid smoking to get the best benefit. What side effects may I notice from receiving this medicine? Side effects that you should report to your doctor or health care professional as soon as possible: -allergic reactions like skin rash, itching or hives, swelling of the face, lips, or tongue -blue tint to skin -chest tightness, pain -difficulty breathing, wheezing -dizziness -red, swollen painful area on the leg Side effects that usually do not require medical attention (report to your  doctor or health care professional if they continue or are bothersome): -diarrhea -headache This list may not describe all possible side effects. Call your doctor for medical advice about side effects. You may report side effects to FDA at 1-800-FDA-1088. Where should I keep my medicine? Keep out of the reach of children. Store at room temperature between 15 and 30 degrees C (59 and 85 degrees F). Protect from light. Throw away any unused medicine after the expiration date. NOTE: This sheet is a summary. It may not cover all possible information. If you have questions about this medicine, talk to your doctor, pharmacist, or health care provider.    2016, Elsevier/Gold  Standard. (2008-03-11 22:10:20)

## 2016-05-07 NOTE — Progress Notes (Signed)
BP 138/76 mmHg  Pulse 81  Temp(Src) 98.9 F (37.2 C)  Wt 115 lb (52.164 kg)  SpO2 98%   Subjective:    Patient ID: [name], female    DOB: 1934/03/27, 82 y.o.   MRN: [id number]  HPI: [name] is a 82 y.o. female  Chief Complaint  Patient presents with  . Memory Loss    Patient states that she is here for a B 12 injection, that [place] made the appointment for her. She drove herself here today.   Saw neurology 4 days ago. Instructed that she is no longer to drive. Started on aricept. States that it is making her feel very woozy when she took it during  She does not remember the neurologist telling her that she is not allowed to drive. She is very upset about this when I told her that she would not be able to drive home and that we had called her daughter to come pick her up. B12 doing well today and numbers increasing. To have her B12 shot given again today. Stable on the wellbutrin. Will not change dose at this time. To take aricept at night to avoid wooziness.   Relevant past medical, surgical, family and social history reviewed and updated as indicated. Interim medical history since our last visit reviewed. Allergies and medications reviewed and updated.  Review of Systems  Constitutional: Negative.   Respiratory: Negative.   Cardiovascular: Negative.   Psychiatric/Behavioral: Positive for behavioral problems, confusion and dysphoric mood. Negative for suicidal ideas, hallucinations, sleep disturbance, self-injury, decreased concentration and agitation. The patient is nervous/anxious. The patient is not hyperactive.     Per HPI unless specifically indicated above     Objective:    BP 138/76 mmHg  Pulse 81  Temp(Src) 98.9 F (37.2 C)  Wt 115 lb (52.164 kg)  SpO2 98%  Wt Readings from Last 3 Encounters:  05/07/16 115 lb (52.164 kg)  05/03/16 117 lb (53.071 kg)  04/07/16 120 lb (54.432 kg)    Physical Exam  Constitutional: She is oriented to person, place, and  time. She appears well-developed and well-nourished. No distress.  HENT:  Head: Normocephalic and atraumatic.  Right Ear: Hearing normal.  Left Ear: Hearing normal.  Nose: Nose normal.  Eyes: Conjunctivae and lids are normal. Right eye exhibits no discharge. Left eye exhibits no discharge. No scleral icterus.  Cardiovascular: Normal rate, regular rhythm, normal heart sounds and intact distal pulses.  Exam reveals no gallop and no friction rub.   No murmur heard. Pulmonary/Chest: Effort normal and breath sounds normal. No respiratory distress. She has no wheezes. She has no rales. She exhibits no tenderness.  Musculoskeletal: Normal range of motion.  Neurological: She is alert and oriented to person, place, and time.  Skin: Skin is warm, dry and intact. No rash noted. No erythema. No pallor.  Psychiatric: She has a normal mood and affect. Her speech is normal and behavior is normal. Judgment and thought content normal. Cognition and memory are normal.  Nursing note and vitals reviewed.   Results for orders placed or performed in visit on 05/03/16  Vitamin B12  Result Value Ref Range   Vitamin B-12 534 200 - 1100 pg/mL  Folate  Result Value Ref Range   Folate 9.0 >5.4 ng/mL  RPR  Result Value Ref Range   RPR Ser Ql NON REAC NON REAC      Assessment & Plan:   Problem List Items Addressed This Visit  Digestive   B12 deficiency - Primary    B12 given again today. Continue monthly shots, and recheck in September.       Relevant Medications   cyanocobalamin ((VITAMIN B-12)) injection 1,000 mcg     Other   Memory impairment    Continue to follow with neurology. Aricept at night. Advised patient that she cannot be driving. She was very upset about this and states that no one told her. We told her again about the driving evaluator, but that until she does that she cannot drive. Daughter called and came to pick her up.          Follow up plan: Return 4 and 8 weeks B12 nurse  injection, for 2-3 months with me.

## 2016-05-11 ENCOUNTER — Inpatient Hospital Stay: Admission: RE | Admit: 2016-05-11 | Payer: PRIVATE HEALTH INSURANCE | Source: Ambulatory Visit

## 2016-05-17 ENCOUNTER — Ambulatory Visit
Admission: RE | Admit: 2016-05-17 | Discharge: 2016-05-17 | Disposition: A | Discharge status: Home / Self Care | Payer: PPO | Source: Ambulatory Visit | Attending: Neurology | Admitting: Neurology

## 2016-05-17 DIAGNOSIS — R51 Headache: Secondary | ICD-10-CM | POA: Diagnosis not present

## 2016-05-17 DIAGNOSIS — R413 Other amnesia: Secondary | ICD-10-CM

## 2016-05-18 ENCOUNTER — Telehealth: Payer: Self-pay | Admitting: Neurology

## 2016-05-18 NOTE — Telephone Encounter (Signed)
Patient's daughter made aware of results.  

## 2016-05-18 NOTE — Telephone Encounter (Signed)
-----   Message from Alda Berthold, DO sent at 05/18/2016 12:22 PM EDT ----- Please inform patient that her MRI shows generalized volume loss as would be expected in dementia. Thanks.

## 2016-06-03 ENCOUNTER — Ambulatory Visit (INDEPENDENT_AMBULATORY_CARE_PROVIDER_SITE_OTHER): Payer: PPO | Admitting: Psychology

## 2016-06-03 DIAGNOSIS — G301 Alzheimer's disease with late onset: Secondary | ICD-10-CM | POA: Diagnosis not present

## 2016-06-03 DIAGNOSIS — F0280 Dementia in other diseases classified elsewhere without behavioral disturbance: Secondary | ICD-10-CM

## 2016-06-03 DIAGNOSIS — F32A Depression, unspecified: Secondary | ICD-10-CM

## 2016-06-03 DIAGNOSIS — F329 Major depressive disorder, single episode, unspecified: Secondary | ICD-10-CM

## 2016-06-03 NOTE — Progress Notes (Signed)
NEUROPSYCHOLOGICAL INTERVIEW (CPT: [id number])  Name: [name] Date of Birth: [birth year] Date of Interview: 06/03/2016  Reason for Referral:  [name] is a 82 y.o., widowed female who is referred for neuropsychological evaluation by Dr. Wells Guiles Tat of [facility] due to concerns about memory loss and possible Alzheimer's disease. This patient is accompanied in the office by her daughter, [name], who supplements the history.  History of Presenting Problem:  [name] reported that she has always had trouble remembering appointments, but otherwise she feels her cognitive functioning is normal. Later, she noted "occasional memory issues". Her daughter reported that the family has had concerns about the patient's cognitive functioning since 12-Aug-2015 when her son passed away. She noted there were probably memory changes before that but it became very noticeable when they were in the hospital with the patient's son (he was on a ventilator before he died). At that time, she demonstrated confusion surrounding family members. Her daughter also reported that the patient had a UTI in April of this year and demonstrated significant confusion at that time. This was treated successfully; however, the patient continues to demonstrate cognitive difficulties. Since April, her daughter reported that [name] frequently asks when her husband is coming home (he has been deceased for 10 years). She does not seem to grieve his loss each time her family reminds her that he is deceased. Upon direct questioning, the patient's daughter also reported: some forgetfulness for recent conversations/events; misplacing/losing items; occasional word findings, occasional difficulty comprehending others.   The patient continues to drive without any reported difficulty. Her family does not have concerns about her driving. They have a GPS tracker on her car and are able to monitor where she goes. She has not gotten lost.  The patient is also managing her medications independently, and the family is not sure how this is going. They are going to start monitoring this. Her son has started helping with the finances because she had overlooked some bills. She does not have any reported difficulty with cooking. She has had some confusion regarding appointments; her family is helping with her calendar and appointment tracking. [name] noted that the patient does not keep her house as clean as she used to.  The patient denied any current physical complaints. She has no trouble with walking or balance. She has not had any falls in the past year. With regard to mood, the patient reported that she is grieving the loss of her son, who had Down syndrome. She is still able to enjoy things. She is more easily frustrated. She denied any difficulty with sleep. Her appetite is unchanged. The patient complains of loneliness but is no longer participating in activities she used to enjoy, and makes excuses about this. She is prescribed Wellbutrin by her general practitioner. She saw a psychiatrist for one visit in the past. She noted that she has had depression for many years. She did do some counseling in the past.  The patient denied suicidal ideation or intention. Her daughter has not observed any hallucinations. When asked about current stressors, [name] mentioned that her twin sister has depression and is rather negative, and the patient talks to her on the phone 3-4 times a day.  The patient saw Dr. Carles Collet for neurologic consultation on 05/03/2016 and scored 15/30 on the [facility] A brain MRI was completed on 05/17/2016 which did not reveal any acute infarct or intracranial hemorrhage. There was moderate global atrophy most prominent in the temporal  lobe region. There was also moderate small vessel disease/chronic microvascular changes.  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 Marital history: Widowed since 2006. Had been married 84 years. She and her husband had three children; her oldest son had Down syndrome and recently passed away 2015-07-29). She has a son and daughter still living.  Alcohol/Tobacco/Substances: Occasional alcohol (no history of heavy drinking). Former smoker (quit 2009).   Medical History: Past Medical History  Diagnosis Date  . Dizziness   . Depression   . Hx of migraine headaches   . Vitamin D deficiency disease   . Hypertension   . RBBB   . Thrombocytopenia ([place])   . Heart murmur     Current Medications:  Outpatient Encounter Prescriptions as of 06/03/2016  Medication Sig  . aspirin EC 81 MG tablet Take 81 mg by mouth daily.  Marland Kitchen buPROPion (WELLBUTRIN XL) 300 MG 24 hr tablet Take 1 tablet (300 mg total) by mouth daily.  Marland Kitchen donepezil (ARICEPT) 10 MG tablet Take 1 tablet (10 mg total) by mouth at bedtime.  . donepezil (ARICEPT) 5 MG tablet Take 1 tablet (5 mg total) by mouth at bedtime.   Facility-Administered Encounter Medications as of 06/03/2016  Medication  . cyanocobalamin ((VITAMIN B-12)) injection 1,000 mcg    Behavioral Observations:   Appearance: Appropriately dressed and groomed Gait: Ambulated independently, no abnormalities observed Speech: Fluent; normal rate, rhythm and volume Thought process: Non-linear at times. Repeats herself frequently. Poor historian. Affect: Full, tearful when talking about her deceased son Interpersonal: Pleasant, appropriate   TESTING: There is medical necessity to proceed with neuropsychological assessment as the results will be used to aid in differential diagnosis and clinical decision-making and to inform specific treatment recommendations. Per the patient, her daughter and medical records reviewed, there has been a change in cognitive functioning and a reasonable suspicion of dementia.   PLAN: The patient will return for a full battery of  neuropsychological testing with a psychometrician under my supervision. Education regarding testing procedures was provided. Subsequently, the patient will see this provider for a follow-up session at which time her test performances and my impressions and treatment recommendations will be reviewed in detail.   Full neuropsychological evaluation report to follow.

## 2016-06-04 ENCOUNTER — Ambulatory Visit: Payer: PPO

## 2016-06-08 ENCOUNTER — Ambulatory Visit (INDEPENDENT_AMBULATORY_CARE_PROVIDER_SITE_OTHER): Payer: PPO

## 2016-06-08 ENCOUNTER — Ambulatory Visit (INDEPENDENT_AMBULATORY_CARE_PROVIDER_SITE_OTHER): Payer: PPO | Admitting: Psychology

## 2016-06-08 DIAGNOSIS — R413 Other amnesia: Secondary | ICD-10-CM | POA: Diagnosis not present

## 2016-06-08 DIAGNOSIS — E538 Deficiency of other specified B group vitamins: Secondary | ICD-10-CM

## 2016-06-09 ENCOUNTER — Encounter: Payer: Self-pay | Admitting: Psychology

## 2016-06-22 ENCOUNTER — Ambulatory Visit (INDEPENDENT_AMBULATORY_CARE_PROVIDER_SITE_OTHER): Payer: PRIVATE HEALTH INSURANCE | Admitting: Psychology

## 2016-06-22 DIAGNOSIS — G301 Alzheimer's disease with late onset: Secondary | ICD-10-CM

## 2016-06-22 DIAGNOSIS — F0281 Dementia in other diseases classified elsewhere with behavioral disturbance: Secondary | ICD-10-CM
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
## 2016-07-02 ENCOUNTER — Ambulatory Visit (INDEPENDENT_AMBULATORY_CARE_PROVIDER_SITE_OTHER): Payer: PPO

## 2016-07-02 ENCOUNTER — Ambulatory Visit: Payer: PPO

## 2016-07-02 DIAGNOSIS — E538 Deficiency of other specified B group vitamins: Secondary | ICD-10-CM

## 2016-07-02 MED ORDER — CYANOCOBALAMIN 1000 MCG/ML IJ SOLN
1000.0000 ug | Freq: Once | INTRAMUSCULAR | Status: AC
  Administered 2016-07-02: 1000 ug via INTRAMUSCULAR

## 2016-07-12 ENCOUNTER — Telehealth: Payer: Self-pay | Admitting: Family Medicine

## 2016-07-12 NOTE — Telephone Encounter (Signed)
Pts daughter would like a call back regarding dosage of meds.

## 2016-07-13 NOTE — Telephone Encounter (Signed)
Patients daughter wanted clarification on what dose wellbutrin patient should be on, checked patients chart, patient is to be on 300mg  daily.

## 2016-07-13 NOTE — Telephone Encounter (Signed)
Called and left a message for patients daughter to return my call if she still has questions about her mothers medication.

## 2016-07-16 ENCOUNTER — Ambulatory Visit (INDEPENDENT_AMBULATORY_CARE_PROVIDER_SITE_OTHER): Payer: PPO | Admitting: Family Medicine

## 2016-07-16 ENCOUNTER — Ambulatory Visit: Payer: PPO | Admitting: Family Medicine

## 2016-07-16 ENCOUNTER — Encounter: Payer: Self-pay | Admitting: Family Medicine

## 2016-07-16 VITALS — BP 133/77 | HR 68 | Temp 98.4°F | Wt 110.0 lb

## 2016-07-16 DIAGNOSIS — F0281 Dementia in other diseases classified elsewhere with behavioral disturbance: Secondary | ICD-10-CM | POA: Insufficient documentation

## 2016-07-16 DIAGNOSIS — H6121 Impacted cerumen, right ear: Secondary | ICD-10-CM

## 2016-07-16 DIAGNOSIS — G309 Alzheimer's disease, unspecified: Secondary | ICD-10-CM

## 2016-07-16 DIAGNOSIS — F09 Unspecified mental disorder due to known physiological condition: Secondary | ICD-10-CM | POA: Diagnosis not present

## 2016-07-16 DIAGNOSIS — F331 Major depressive disorder, recurrent, moderate: Secondary | ICD-10-CM

## 2016-07-16 DIAGNOSIS — M545 Low back pain, unspecified: Secondary | ICD-10-CM

## 2016-07-16 DIAGNOSIS — F0280 Dementia in other diseases classified elsewhere without behavioral disturbance: Secondary | ICD-10-CM

## 2016-07-16 DIAGNOSIS — G301 Alzheimer's disease with late onset: Secondary | ICD-10-CM

## 2016-07-16 DIAGNOSIS — F02B0 Dementia in other diseases classified elsewhere, moderate, without behavioral disturbance, psychotic disturbance, mood disturbance, and anxiety: Secondary | ICD-10-CM

## 2016-07-16 MED ORDER — ASPIRIN EC 81 MG PO TBEC: 81.0000 mg | DELAYED_RELEASE_TABLET | Freq: Every day | ORAL | 4 refills | Status: DC

## 2016-07-16 MED ORDER — BUPROPION HCL ER (XL) 300 MG PO TB24: 300.0000 mg | ORAL_TABLET | Freq: Every day | ORAL | 1 refills | Status: DC

## 2016-07-16 NOTE — Progress Notes (Signed)
BP 133/77 (BP Location: Left Arm, Patient Position: Sitting, Cuff Size: Small)   Pulse 68   Temp 98.4 F (36.9 C)   Wt 110 lb (49.9 kg)   SpO2 98%   BMI 18.88 kg/m    Subjective:    Patient ID: [name], female    DOB: [birth year], 82 y.o.   MRN: [id number]  HPI: [name] is a 82 y.o. female  Chief Complaint  Patient presents with  . Back Pain    waistline  . Dizziness  . Memory Loss   Has been following with neurology regarding her memory loss. Diagnosed with moderate dementia, likely Alzheimer's disease. To see them again at the end of September. They noted that she would have difficulty with complex tasks such as bill paying and appointment tracking. They did recommend that she take her donepezil, which she has already started. They recommended help with finances, appointments and medication as well as a driving evaluation. She will return to see them in 1 year and will continue to follow with Dr. Carles Collet.   DIZZINESS Duration: Chronic, worse over the past couple of days Description of symptoms: lightheaded Duration of episode: minutes Dizziness frequency: recurrent Provoking factors: none Aggravating factors:  none Triggered by rolling over in bed: no Triggered by bending over: no Aggravated by head movement: no Aggravated by exertion, coughing, loud noises: no Recent head injury: no Recent or current viral symptoms: no History of vasovagal episodes: no Nausea: no Vomiting: no Tinnitus: no Hearing loss: no Aural fullness: no Headache: no Photophobia/phonophobia: no Unsteady gait: no Postural instability: no Diplopia, dysarthria, dysphagia or weakness: no Related to exertion: no Pallor: no Diaphoresis: no Dyspnea: no Chest pain: no   Back has been hurting in the middle of her thorax at TL junction on the L has been going on for a long time. Gets worse at times with activity. Not sure why it happens. No radiation. Aching in nature. Otherwise doing well  with no other concerns or complaints at this time.   Wellbutrin is working well for her. No concerns.   Relevant past medical, surgical, family and social history reviewed and updated as indicated. Interim medical history since our last visit reviewed. Allergies and medications reviewed and updated.  Review of Systems  Constitutional: Negative.   Respiratory: Negative.   Cardiovascular: Negative.   Musculoskeletal: Positive for back pain and myalgias. Negative for arthralgias, gait problem, joint swelling, neck pain and neck stiffness.  Psychiatric/Behavioral: Positive for confusion. Negative for agitation, behavioral problems, decreased concentration, dysphoric mood, hallucinations, self-injury, sleep disturbance and suicidal ideas. The patient is nervous/anxious. The patient is not hyperactive.     Per HPI unless specifically indicated above     Objective:    BP 133/77 (BP Location: Left Arm, Patient Position: Sitting, Cuff Size: Small)   Pulse 68   Temp 98.4 F (36.9 C)   Wt 110 lb (49.9 kg)   SpO2 98%   BMI 18.88 kg/m   Wt Readings from Last 3 Encounters:  07/16/16 110 lb (49.9 kg)  05/17/16 115 lb (52.2 kg)  05/07/16 115 lb (52.2 kg)    Orthostatic VS for the past 24 hrs:  BP- Lying Pulse- Lying BP- Sitting Pulse- Sitting BP- Standing at 0 minutes Pulse- Standing at 0 minutes  07/16/16 1614 148/79 67 149/77 72 130/78 76    Physical Exam  Constitutional: She is oriented to person, place, and time. She appears well-developed and well-nourished. No distress.  HENT:  Head: Normocephalic and atraumatic.  Right Ear: Hearing and external ear normal.  Left Ear: Hearing, tympanic membrane, external ear and ear canal normal.  Nose: Nose normal.  Mouth/Throat: Uvula is midline, oropharynx is clear and moist and mucous membranes are normal.  Cerumen impaction R ear  Eyes: Conjunctivae and lids are normal. Pupils are equal, round, and reactive to light. Right eye exhibits no  discharge. Left eye exhibits no discharge. No scleral icterus. Right eye exhibits nystagmus. Right eye exhibits normal extraocular motion. Left eye exhibits normal extraocular motion and no nystagmus.  Cardiovascular: Normal rate, regular rhythm, normal heart sounds and intact distal pulses.  Exam reveals no gallop and no friction rub.   No murmur heard. Pulmonary/Chest: Effort normal and breath sounds normal. No respiratory distress. She has no wheezes. She has no rales. She exhibits no tenderness.  Neurological: She is alert and oriented to person, place, and time.  Skin: Skin is warm, dry and intact. No rash noted. She is not diaphoretic. No erythema. No pallor.  Psychiatric: She has a normal mood and affect. Her speech is normal and behavior is normal. Judgment and thought content normal. Cognition and memory are impaired.  Nursing note and vitals reviewed. Back Exam:    Inspection:  Normal spinal curvature.  No deformity, ecchymosis, erythema, or lesions     Palpation:     Midline spinal tenderness: no      Paralumbar tenderness: yes Left     Parathoracic tenderness: yes Left     Buttocks tenderness: no     Range of Motion:      Flexion: Normal     Extension:Normal      Lateral bending:Normal    Rotation:Normal    Neuro Exam:Lower extremity DTRs normal & symmetric.  Strength and sensation intact.   Straight leg raise:negative   Results for orders placed or performed in visit on 05/03/16  Vitamin B12  Result Value Ref Range   Vitamin B-12 534 200 - 1,100 pg/mL  Folate  Result Value Ref Range   Folate 9.0 >5.4 ng/mL  RPR  Result Value Ref Range   RPR Ser Ql NON REAC NON REAC      Assessment & Plan:   Problem List Items Addressed This Visit      Nervous and Auditory   Moderate major neurocognitive disorder due to Alzheimer's disease without behavioral disturbance - Primary    Continue to follow with neurology. Call with any concerns.       Relevant Medications    buPROPion (WELLBUTRIN XL) 300 MG 24 hr tablet     Other   Major depressive disorder, recurrent, moderate (HCC)    Under fair control. Will continue wellbutrin. Refill given. Call with any concerns.       Relevant Medications   buPROPion (WELLBUTRIN XL) 300 MG 24 hr tablet    Other Visit Diagnoses    Cerumen impaction, right       Cerumen flushed today. Will start debrox once a month. Likely the cause of her dizziness   Left-sided low back pain without sciatica       Will start lidoderm patches OTC. Will let us know if not helping.    Relevant Medications   aspirin EC 81 MG tablet       Follow up plan: Return in about 3 months (around 10/16/2016) for Follow up.

## 2016-07-16 NOTE — Assessment & Plan Note (Signed)
Under fair control. Will continue wellbutrin. Refill given. Call with any concerns.

## 2016-07-16 NOTE — Patient Instructions (Signed)
OTC medication Lidoderm patch- for the back pain  Debrox- 1x a month

## 2016-07-16 NOTE — Assessment & Plan Note (Signed)
Continue to follow with neurology. Call with any concerns.

## 2016-07-23 NOTE — Progress Notes (Signed)
   Neuropsychology Note  [name] returned on 06/08/2016 for 2 hours of neuropsychological testing with technician, Milana Kidney, BS, under the supervision of Dr. Macarthur Critchley. The patient did not appear overtly distressed by the testing session, per behavioral observation or via self-report to the technician. Rest breaks were offered. [name] will return within 2 weeks for a feedback session with Dr. Si Raider at which time her test performances, clinical impressions and treatment recommendations will be reviewed in detail. The patient understands she can contact our office should she require our assistance before this time.  Full report to follow.

## 2016-07-29 NOTE — Progress Notes (Signed)
   Neuropsychology Note  [name] returned today for 2 hours of neuropsychological testing with technician, Milana Kidney, BS, under the supervision of Dr. Macarthur Critchley. The patient did not appear overtly distressed by the testing session, per behavioral observation or via self-report to the technician. Rest breaks were offered. [name] will return within 2 weeks for a feedback session with Dr. Si Raider at which time her test performances, clinical impressions and treatment recommendations will be reviewed in detail. The patient understands she can contact our office should she require our assistance before this time.  Full report to follow.

## 2016-08-24 ENCOUNTER — Telehealth: Payer: Self-pay

## 2016-08-24 NOTE — Telephone Encounter (Signed)
Best thing they can do for not eating enough is get her some ensure (let me know if they need a RX) and encourage her to drink one while they are on the phone or with her daily. That will cover some protein and vitamins. For drinking, keep a cup filled and remind her to make sure she's drinking it. If they need someone to come in and help, let me know and we can arrange for that. If this does not improve her dizziness, let me know and we'll get her in to make sure it's not something else.

## 2016-08-24 NOTE — Telephone Encounter (Signed)
Patient's daughter [name]) states her mother [name] is experiencing dizziness.  [name] does not believe her mother is eating or drinking enough and would like some guidance on what to do.  Please call to advise.

## 2016-08-24 NOTE — Telephone Encounter (Signed)
Spoke with patient's daughter [name]. She said they have Ensure, Boost, and everything else.  I encouraged the Ensure like Dr. Wynetta Emery advised. To keep a cup around her to remember.  Explained if that still doesn't help with the dizziness, to call and we'll see if there isn't something else going on.

## 2016-09-02 ENCOUNTER — Ambulatory Visit: Payer: PPO | Admitting: Neurology

## 2016-09-03 ENCOUNTER — Ambulatory Visit: Payer: PRIVATE HEALTH INSURANCE | Admitting: Neurology

## 2016-09-06 NOTE — Progress Notes (Signed)
The patient is seen in neurologic consultation at the request of Dr. Sanda Klein for the evaluation of memory.  The patient is accompanied by her daughter and son who supplement the history.  The patient is a 82 y.o. year old female who has had memory issues for about 6 months.  She noted that she would have to write things down.   Memory loss apparently got worse after the death of her son last 08/01/23 (who had downs syndrome).  She was referred to psychiatry for MDD in 02/2016 but they state that for some reason, an appt wasn't made.  Her family notes that she has called them multiple times a day for the last 6 weeks and asked them when her husband was coming home but he has been dead for 6 years.  The patient lives alone with her dog and she is able to take care of her dog.   The patient does do the finances in the home and reports that she has no trouble with that.  The patient does drive and she reports that she has no difficulty with that but family reports that they haven't driven with her.  There have not been any motor vehicle accidents in the recent years.  The patient does cook.  There is no difficulty remembering common recipes.  Her cooking still tastes good.    The stovetop has not been left on accidentally.  The patient is able to perform her own ADL's.  The patient is able to distribute her own medications but states that she is only on a vitamin and antidepressant but her daughter does report that she was on a medication for UTI and she did get confused with that.  The patients bladder and bowel are under good control.  There have been no behavioral changes over the years.  There have been no hallucinations.  Fam hx of memory issue in her mother.  09/07/16 update:  Pt f/u today, accompanied by her daughter who supplement the history.  she saw Dr. Si Raider for neuropscyhometric testing on 06/08/16 and subsequently had a feedback session with her where results and recommendations were given to her.   These are detailed within the chart.  This confirmed the diagnosis of Alzheimer's dementia.  She talked to the patient about needing a driving evaluation, if the patient wanted to continue driving.  Last visit, I had asked her to stop driving.  She states that she has not driven.  Daughter states that they had an issue with the car and didn't get it fixed.  She had an MRI of the brain on 05/18/2016 that I had the opportunity to review.  There was mild atrophy and moderate small vessel disease.  Her B12 was rechecked since last visit and was now normal at 534.  She quit taking those in July.   Aricept was started last visit.  She is tolerating it well, without SE.  Her daughter is administering this for a month and before that she may not have been taking it.  She was dizzy but started drinking protein shakes (premier) and it helped.     Allergies: No Known Allergies  Current Outpatient Prescriptions on File Prior to Visit  Medication Sig Dispense Refill  . aspirin EC 81 MG tablet Take 1 tablet (81 mg total) by mouth daily. 90 tablet 4  . buPROPion (WELLBUTRIN XL) 300 MG 24 hr tablet Take 1 tablet (300 mg total) by mouth daily. 90 tablet 1  . donepezil (ARICEPT)  10 MG tablet Take 1 tablet (10 mg total) by mouth at bedtime. 30 tablet 3   Current Facility-Administered Medications on File Prior to Visit  Medication Dose Route Frequency Provider Last Rate Last Dose  . cyanocobalamin ((VITAMIN B-12)) injection 1,000 mcg  1,000 mcg Intramuscular Q30 days Valerie Roys, DO   1,000 mcg at 06/08/16 1158     Past Medical History:  Diagnosis Date  . Depression   . Dizziness   . Heart murmur   . Hx of migraine headaches   . Hypertension   . RBBB   . Thrombocytopenia ([place])   . Vitamin D deficiency disease     Past Surgical History:  Procedure Laterality Date  . ABDOMINAL HYSTERECTOMY    . APPENDECTOMY    . CATARACT EXTRACTION, BILATERAL    . CHOLECYSTECTOMY      Social History   Social  History  . Marital status: Unknown    Spouse name: N/A  . Number of children: N/A  . Years of education: N/A   Occupational History  . retired     Dance movement psychotherapist   Social History Main Topics  . Smoking status: Former Smoker    Packs/day: 0.50    Years: 15.00    Types: Cigarettes    Quit date: 12/14/2007  . Smokeless tobacco: Never Used  . Alcohol use 0.0 oz/week     Comment: glass of wine every couple weeks  . Drug use: No  . Sexual activity: Not on file   Other Topics Concern  . Not on file   Social History Narrative  . No narrative on file    Family Status  Relation Status  . Mother Deceased   heart disease  . Father Deceased   unknown  . Sister Alive   atrial fib, rheumatoid  . Sister Alive   unknown  . Brother Alive   unknown  . Daughter Alive   healthy  . Son Alive   healthy  . Son Deceased   down syndrome    ROS:  A complete 10 system ROS was obtained and was unremarkable except as above.   VITALS:   Vitals:   09/07/16 1531  BP: 124/68  Pulse: 80  Weight: 103 lb (46.7 kg)  Height: 5\' 4"  (1.626 m)    Wt Readings from Last 3 Encounters:  09/07/16 103 lb (46.7 kg)  07/16/16 110 lb (49.9 kg)  05/17/16 115 lb (52.2 kg)    HEENT:  Normocephalic, atraumatic. The mucous membranes are moist. The superficial temporal arteries are without ropiness or tenderness. Cardiovascular: Regular rate and rhythm. Lungs: Clear to auscultation bilaterally. Neck: There are no carotid bruits noted bilaterally.  NEUROLOGICAL:  Orientation:   Montreal Cognitive Assessment  05/03/2016  Visuospatial/ Executive (0/5) 2  Naming (0/3) 2  Attention: Read list of digits (0/2) 2  Attention: Read list of letters (0/1) 1  Attention: Serial 7 subtraction starting at 100 (0/3) 1  Language: Repeat phrase (0/2) 2  Language : Fluency (0/1) 0  Abstraction (0/2) 2  Delayed Recall (0/5) 0  Orientation (0/6) 3  Total 15  Adjusted Score (based on education) 15    Cranial  nerves: There is good facial symmetry.  Extraocular muscles are intact and visual fields are full to confrontational testing. Speech is fluent and clear. Soft palate rises symmetrically and there is no tongue deviation. Hearing is intact to conversational tone. Tone: Tone is good throughout. Sensation: Sensation is intact to light touch throughout. Coordination:  The patient has no difficulty with RAM's or FNF bilaterally. Motor: Strength is 5/5 in the bilateral upper and lower extremities. There is no pronator drift.  There are no fasciculations noted. Gait and Station: The patient is able to ambulate without difficulty.  Frontal release:  Positive palmomental and glabellar tap.    LABS:    Chemistry      Component Value Date/Time   NA 141 03/06/2012 1912   K 3.5 03/06/2012 1912   CL 104 03/06/2012 1912   CO2 25 03/06/2012 1912   BUN 17 03/06/2012 1912   CREATININE 0.58 (L) 03/06/2012 1912      Component Value Date/Time   CALCIUM 9.0 03/06/2012 1912   ALKPHOS 144 (H) 03/06/2012 1912   AST 38 (H) 03/06/2012 1912   ALT 30 03/06/2012 1912   BILITOT 2.1 (H) 03/06/2012 1912     Lab Results  Component Value Date   [id number] 05/03/2016   IMPRESSIONS/PLAN:  1.  Memory Loss  -she saw Dr. Si Raider for neuropscyhometric testing on 06/08/16 and subsequently had a feedback session with her where results and recommendations were given to her.  These are detailed within the chart.  This confirmed the diagnosis of Alzheimer's dementia  -We discussed the importance of physical and mental exercises and I explained to the patient and family what this means.    -Pt should not be driving.  She has not driven over the last several weeks.  -I told the patient and her daughter that this patient should not be living alone.  The patient repetitively talks about not eating dinner, because she is waiting for her husband to come home.  She also tells me several times during the visit that her husband is  on vacation right now (according to her daughter, her husband has been dead for several years).  Explained why the patient is not safe in her current environment.  Explained the risks of leaving her alone.  Her daughter does express to me that they are looking at facilities and just "need to choose one."  -We'll continue on donepezil.  Her daughter is now giving her the medication.  -Talked to the patient about having a regular schedule daily.   2.  B12 deficiency  -better on B12 injections but then stopped the injection after it was checked.  Needs to restart  3.  Weight loss  -Her daughter has started her on protein supplements, with which the patient lives.  She definitely has lost weight.  I'm very concerned about this.  I think she is not eating partially because she is waiting for her husband come home, when in fact her husband has been deceased for several years.  This is another reason why she needs 24-hour per day chair.  4.  Follow up is anticipated in the 6 few months, sooner should new neurologic issues arise.  Much greater than 50% of this visit was spent in counseling and coordinating care.  Total face to face time:  40 min

## 2016-09-07 ENCOUNTER — Encounter: Payer: Self-pay | Admitting: Neurology

## 2016-09-07 ENCOUNTER — Ambulatory Visit (INDEPENDENT_AMBULATORY_CARE_PROVIDER_SITE_OTHER): Payer: PPO | Admitting: Neurology

## 2016-09-07 VITALS — BP 124/68 | HR 80 | Ht 64.0 in | Wt 103.0 lb

## 2016-09-07 DIAGNOSIS — G301 Alzheimer's disease with late onset: Secondary | ICD-10-CM | POA: Diagnosis not present

## 2016-09-07 DIAGNOSIS — R634 Abnormal weight loss: Secondary | ICD-10-CM | POA: Diagnosis not present

## 2016-09-07 DIAGNOSIS — E538 Deficiency of other specified B group vitamins: Secondary | ICD-10-CM | POA: Diagnosis not present

## 2016-09-07 DIAGNOSIS — F0280 Dementia in other diseases classified elsewhere without behavioral disturbance: Secondary | ICD-10-CM

## 2016-09-07 NOTE — Patient Instructions (Addendum)
1.  You need to have a regular, set schedule every day and follow that schedule daily 2.  I recommend we find an alternative living situation (needs 24 hour per day supervision) 3.  You need to engage in social activities with friends/family 4.  No driving 5.  Follow up in 6 months, sooner should any new issues arise

## 2016-09-13 ENCOUNTER — Encounter: Payer: Self-pay | Admitting: Family Medicine

## 2016-09-13 ENCOUNTER — Ambulatory Visit (INDEPENDENT_AMBULATORY_CARE_PROVIDER_SITE_OTHER): Payer: PPO | Admitting: Family Medicine

## 2016-09-13 VITALS — BP 147/81 | HR 78 | Temp 98.7°F | Wt 102.7 lb

## 2016-09-13 DIAGNOSIS — F0280 Dementia in other diseases classified elsewhere without behavioral disturbance: Secondary | ICD-10-CM

## 2016-09-13 DIAGNOSIS — Z23 Encounter for immunization: Secondary | ICD-10-CM

## 2016-09-13 DIAGNOSIS — E538 Deficiency of other specified B group vitamins: Secondary | ICD-10-CM

## 2016-09-13 DIAGNOSIS — M545 Low back pain, unspecified: Secondary | ICD-10-CM

## 2016-09-13 DIAGNOSIS — G301 Alzheimer's disease with late onset: Secondary | ICD-10-CM

## 2016-09-13 MED ORDER — MELOXICAM 15 MG PO TABS: 15.0000 mg | ORAL_TABLET | Freq: Every day | ORAL | 0 refills | Status: DC

## 2016-09-13 MED ORDER — CYANOCOBALAMIN 1000 MCG/ML IJ SOLN
1000.0000 ug | INTRAMUSCULAR | Status: DC
  Administered 2016-09-13 – 2016-10-11 (×2): 1000 ug via INTRAMUSCULAR

## 2016-09-13 NOTE — Assessment & Plan Note (Signed)
B12 given today. Return in 1 month for shot

## 2016-09-13 NOTE — Progress Notes (Signed)
BP (!) 147/81 (BP Location: Left Arm, Patient Position: Sitting, Cuff Size: Small)   Pulse 78   Temp 98.7 F (37.1 C)   Wt 102 lb 11.2 oz (46.6 kg)   SpO2 99%   BMI 17.63 kg/m    Subjective:    Patient ID: [name], female    DOB: June 08, 1934, 82 y.o.   MRN: [id number]  HPI: [name] is a 82 y.o. female  Chief Complaint  Patient presents with  . Back Pain   BACK PAIN Duration: weeks Mechanism of injury: no trauma Location: bilateral and low back Onset: gradual Severity: moderate Quality: "pain" Frequency: constant Radiation: none Aggravating factors: prolonged sitting Alleviating factors: nothing Status: stable Treatments attempted: none Relief with NSAIDs?: No NSAIDs Taken Nighttime pain:  no Paresthesias / decreased sensation:  no Bowel / bladder incontinence:  no Fevers:  no Dysuria / urinary frequency:  no  Relevant past medical, surgical, family and social history reviewed and updated as indicated. Interim medical history since our last visit reviewed. Allergies and medications reviewed and updated.  Review of Systems  Constitutional: Negative.   Respiratory: Negative.   Musculoskeletal: Positive for back pain, gait problem and myalgias. Negative for arthralgias, joint swelling, neck pain and neck stiffness.  Neurological: Negative for dizziness, tremors, seizures, syncope, facial asymmetry, speech difficulty, weakness, light-headedness, numbness and headaches.  Psychiatric/Behavioral: Negative.     Per HPI unless specifically indicated above     Objective:    BP (!) 147/81 (BP Location: Left Arm, Patient Position: Sitting, Cuff Size: Small)   Pulse 78   Temp 98.7 F (37.1 C)   Wt 102 lb 11.2 oz (46.6 kg)   SpO2 99%   BMI 17.63 kg/m   Wt Readings from Last 3 Encounters:  09/13/16 102 lb 11.2 oz (46.6 kg)  09/07/16 103 lb (46.7 kg)  07/16/16 110 lb (49.9 kg)    Physical Exam  Constitutional: She is oriented to person, place, and  time. She appears well-developed and well-nourished. No distress.  HENT:  Head: Normocephalic and atraumatic.  Right Ear: Hearing normal.  Left Ear: Hearing normal.  Nose: Nose normal.  Eyes: Conjunctivae and lids are normal. Right eye exhibits no discharge. Left eye exhibits no discharge. No scleral icterus.  Cardiovascular: Normal rate, regular rhythm, normal heart sounds and intact distal pulses.  Exam reveals no gallop and no friction rub.   No murmur heard. Pulmonary/Chest: Effort normal and breath sounds normal. No respiratory distress. She has no wheezes. She has no rales. She exhibits no tenderness.  Abdominal: Soft. She exhibits no distension and no mass. There is no tenderness. There is no rebound and no guarding.  Neurological: She is alert and oriented to person, place, and time.  Skin: Skin is warm, dry and intact. No rash noted. No erythema. No pallor.  Psychiatric: She has a normal mood and affect. Her speech is normal and behavior is normal. Judgment and thought content normal. Cognition and memory are normal.  Nursing note and vitals reviewed. Back Exam:    Inspection:  Normal spinal curvature.  No deformity, ecchymosis, erythema, or lesions     Palpation:     Midline spinal tenderness: no      Paralumbar tenderness: yes bilateral     Parathoracic tenderness: no      Buttocks tenderness: no     Range of Motion:      Flexion: Fingers to Knees     Extension:Decreased     Lateral bending:Decreased  Rotation:Decreased    Neuro Exam:Lower extremity DTRs normal & symmetric.  Strength and sensation intact.    Special Tests:      Straight leg raise:negative  Results for orders placed or performed in visit on 05/03/16  Vitamin B12  Result Value Ref Range   Vitamin B-12 534 200 - 1,100 pg/mL  Folate  Result Value Ref Range   Folate 9.0 >5.4 ng/mL  RPR  Result Value Ref Range   RPR Ser Ql NON REAC NON REAC      Assessment & Plan:   Problem List Items Addressed  This Visit      Nervous and Auditory   Late onset Alzheimer's disease without behavioral disturbance    Going to get home health in. Daughter working on this. Continue to monitor.         Other   B12 deficiency    B12 given today. Return in 1 month for shot      Relevant Medications   cyanocobalamin ((VITAMIN B-12)) injection 1,000 mcg    Other Visit Diagnoses    Acute bilateral low back pain without sciatica    -  Primary   Likely arthritis. Will start meloxicam. Checking urine today. Continue to monitor closely. Call with any concerns.    Relevant Medications   meloxicam (MOBIC) 15 MG tablet   Other Relevant Orders   Urine Culture   Immunization due       Flu shot given today.   Relevant Orders   Flu vaccine HIGH DOSE PF (Fluzone High dose) (Completed)       Follow up plan: Return in about 4 weeks (around 10/11/2016) for B12 shot.

## 2016-09-13 NOTE — Assessment & Plan Note (Signed)
Going to get home health in. Daughter working on this. Continue to monitor.

## 2016-09-15 ENCOUNTER — Telehealth: Payer: Self-pay | Admitting: Family Medicine

## 2016-09-15 LAB — URINE CULTURE

## 2016-09-15 NOTE — Telephone Encounter (Signed)
Patient's daughter notified.

## 2016-09-15 NOTE — Telephone Encounter (Signed)
Please let her daughter know that her urine did not grow out any bacteria. Thanks!

## 2016-10-11 ENCOUNTER — Ambulatory Visit (INDEPENDENT_AMBULATORY_CARE_PROVIDER_SITE_OTHER): Payer: PPO

## 2016-10-11 DIAGNOSIS — E538 Deficiency of other specified B group vitamins: Secondary | ICD-10-CM | POA: Diagnosis not present

## 2016-11-08 ENCOUNTER — Telehealth: Payer: Self-pay | Admitting: Family Medicine

## 2016-11-08 ENCOUNTER — Telehealth: Payer: Self-pay | Admitting: Neurology

## 2016-11-08 NOTE — Telephone Encounter (Signed)
[name] June 20, 2034. Her daughter and POA Posey Pronto called to see if she could get a letter from Dr. Carles Collet to state that her mother should not handle her finances. She has been getting phone calls from scammers and giving out her personal banking information to them over the phone. Her daughters # is [phone]. Thank you

## 2016-11-08 NOTE — Telephone Encounter (Signed)
Pt's daughter [name] called stated pt is getting a lot of scamming calls and is providing her banking information. [name] would like to know if Dr. Wynetta Emery could type something up stating that the patient is not able to handle her personal finances. [name] stated Dr. Wynetta Emery can call her if needed. Thanks.

## 2016-11-08 NOTE — Telephone Encounter (Signed)
Letter is up front with my signature on it for [facility] to pick up

## 2016-11-08 NOTE — Telephone Encounter (Signed)
Okay to write letter? Please advise.

## 2016-11-08 NOTE — Telephone Encounter (Signed)
I see that her PCP just wrote that letter today, which should suffice.

## 2016-11-12 ENCOUNTER — Ambulatory Visit: Payer: PPO

## 2016-11-27 ENCOUNTER — Other Ambulatory Visit: Payer: Self-pay | Admitting: Neurology

## 2016-12-03 ENCOUNTER — Ambulatory Visit (INDEPENDENT_AMBULATORY_CARE_PROVIDER_SITE_OTHER): Payer: PPO | Admitting: Family Medicine

## 2016-12-03 ENCOUNTER — Encounter: Payer: Self-pay | Admitting: Family Medicine

## 2016-12-03 VITALS — BP 151/76 | HR 91 | Temp 98.2°F | Wt 98.6 lb

## 2016-12-03 DIAGNOSIS — R42 Dizziness and giddiness: Secondary | ICD-10-CM | POA: Diagnosis not present

## 2016-12-03 NOTE — Progress Notes (Signed)
BP (!) 151/76 (BP Location: Right Arm, Patient Position: Sitting, Cuff Size: Small)   Pulse 91   Temp 98.2 F (36.8 C)   Wt 98 lb 9.6 oz (44.7 kg)   SpO2 98%   BMI 16.92 kg/m    Subjective:    Patient ID: [name], female    DOB: 09-14-1934, 82 y.o.   MRN: [id number]  HPI: [name] is a 82 y.o. female  Chief Complaint  Patient presents with  . Back Pain  . Dizziness   Patient accompanied by daughter, who helps provide the HPI information today. Daughter states her mother has dementia and has a full-time caregiver with her at all times.   2 day history of increased confusion and dizziness x 2 days. States she feels very off balance especially when getting up too quickly. Has stumbled once but was not injured other than a superficial abrasion to left elbow which has been healing without issue. Daughter and caregiver have noted that she does not eat or drink appropriately, rarely ever drinks water. Denies any back pain or urinary symptoms that she's noticed. Denies CP, SOB. No new medications or foods that she isn't used to. BM regular and soft.   Relevant past medical, surgical, family and social history reviewed and updated as indicated. Interim medical history since our last visit reviewed. Allergies and medications reviewed and updated.  Review of Systems  Constitutional: Negative.   HENT: Negative.   Respiratory: Negative.   Cardiovascular: Negative.   Gastrointestinal: Negative.   Genitourinary: Negative.   Musculoskeletal: Negative.   Skin:       Superficial abrasion to left elbow  Neurological: Positive for dizziness.  Hematological: Negative.   Psychiatric/Behavioral: Positive for confusion.    Per HPI unless specifically indicated above     Objective:    BP (!) 151/76 (BP Location: Right Arm, Patient Position: Sitting, Cuff Size: Small)   Pulse 91   Temp 98.2 F (36.8 C)   Wt 98 lb 9.6 oz (44.7 kg)   SpO2 98%   BMI 16.92 kg/m   Wt Readings  from Last 3 Encounters:  12/03/16 98 lb 9.6 oz (44.7 kg)  09/13/16 102 lb 11.2 oz (46.6 kg)  09/07/16 103 lb (46.7 kg)    Physical Exam  Constitutional: She is oriented to person, place, and time. She appears well-developed and well-nourished.  HENT:  Head: Atraumatic.  Eyes: Conjunctivae are normal. Pupils are equal, round, and reactive to light. No scleral icterus.  Neck: Normal range of motion. Neck supple.  Cardiovascular: Normal rate and normal heart sounds.   Pulmonary/Chest: Effort normal and breath sounds normal. No respiratory distress.  Abdominal: Soft. Bowel sounds are normal. She exhibits no distension and no mass. There is no tenderness.  Musculoskeletal: Normal range of motion.  Neurological: She is alert and oriented to person, place, and time.  Skin: Skin is warm and dry.  Well healing superficial abrasion to left elbow. No evidence of infection  Psychiatric: She has a normal mood and affect. Her behavior is normal.  Nursing note and vitals reviewed.     Assessment & Plan:   Problem List Items Addressed This Visit    None    Visit Diagnoses    Dizziness    -  Primary   Relevant Orders   UA/M w/rflx Culture, Routine    Patient unable to give urine specimen today after several glasses of water. Given that it is end of day on holiday weekend,  gave strict instructions about symptoms to monitor for that would indicate she needs to go to [facility] for urine testing. Suspect sxs are hydration related - discussed improving dietary intake, especially plain water or gatorade. Patient agreeable to this plan.   Follow up plan: Return if symptoms worsen or fail to improve.

## 2016-12-03 NOTE — Patient Instructions (Signed)
Follow up if no improvement over weekend 

## 2016-12-24 ENCOUNTER — Telehealth: Payer: Self-pay

## 2016-12-24 NOTE — Telephone Encounter (Signed)
Unfortunately that is the case. If she is concerned she is dehydrated, she may need fluids, in which case she'd have to get it at the ER anyway. Please let us know if they need anything.

## 2016-12-24 NOTE — Telephone Encounter (Signed)
Patient's daughter called and said that the patient is with her caregiver right now.  The caregiver explained that the patient said she was "dizzy and tired."  The caregiver has been giving her gatorade and water because she thinks she's dehydrated. But the patient is "dozing off." I explained that Dr. Wynetta Emery was completely booked but let me send her a message and see. If is dozing off or passing out and things seem alarming to take her to the ER.   Daughter said "yes that's why I called instead of waiting over the weekend for an appointment".  I told patient to hold on, let me look at Dr. Durenda Age schedule again and go ask, and the daughter hung up.

## 2017-01-24 ENCOUNTER — Telehealth: Payer: Self-pay | Admitting: Family Medicine

## 2017-01-24 MED ORDER — QUETIAPINE FUMARATE 25 MG PO TABS: 25.0000 mg | ORAL_TABLET | Freq: Every day | ORAL | 3 refills | Status: DC

## 2017-01-24 NOTE — Telephone Encounter (Signed)
Pts daughter, [name] called and would like to know how often the pt should be getting her B12 shot. She would also like to know if she could get a medication to help take some of the edge off. She stated that the pt had been angry and frustrated and nothing was helping.

## 2017-01-24 NOTE — Telephone Encounter (Signed)
Called and spoke with patient's daughter. I let her know that Dr. Wynetta Emery sent the patient in seroquel to start taking daily. I also let her know that the patient should be getting her b12 injection every month. Appointment for injection scheduled for 01/31/17.

## 2017-01-24 NOTE — Telephone Encounter (Signed)
Routing to provider  

## 2017-01-24 NOTE — Telephone Encounter (Signed)
Rx sent to her pharmacy. She should be getting her B12 shot monthly and it looks like she hasn't gotten it since October.

## 2017-01-25 ENCOUNTER — Telehealth: Payer: Self-pay | Admitting: Neurology

## 2017-01-25 NOTE — Telephone Encounter (Signed)
Patient daughter [name] called and wants to talk to someone about a medication that the PCP gave to her mother yesterday please call her at [phone]

## 2017-01-25 NOTE — Telephone Encounter (Signed)
Patients daughter made aware

## 2017-01-25 NOTE — Telephone Encounter (Signed)
We can talk about it at our next visit as this is a complicated issue and much too difficult as a phone issue.  They are often used in dementia patients with the understanding of the black box warning and would suggest that they discuss with prescribing physician, although I don't necessarily disagree with the use and I use these in patients as well.

## 2017-01-25 NOTE — Telephone Encounter (Signed)
Spoke with patient's daughter. She states PCP prescribed patient Seroquel (yesterday) and daughter read the warning about not using in dementia/alzheimer's patients.  I made her aware there is a warning on these type of drugs, but they are used frequently.  Dr. Carles Collet please advise if okay for patient to start medication as prescribed.

## 2017-01-31 ENCOUNTER — Ambulatory Visit: Payer: Self-pay

## 2017-02-07 ENCOUNTER — Ambulatory Visit: Payer: PPO

## 2017-02-17 ENCOUNTER — Other Ambulatory Visit: Payer: Self-pay | Admitting: Family Medicine

## 2017-02-17 NOTE — Telephone Encounter (Signed)
Routing to provider. No follow up on file. 
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
## 2017-03-07 ENCOUNTER — Ambulatory Visit (INDEPENDENT_AMBULATORY_CARE_PROVIDER_SITE_OTHER): Payer: PPO | Admitting: Neurology

## 2017-03-07 ENCOUNTER — Encounter: Payer: Self-pay | Admitting: Neurology

## 2017-03-07 VITALS — BP 104/60 | HR 72 | Ht 64.0 in | Wt 107.0 lb

## 2017-03-07 DIAGNOSIS — F0281 Dementia in other diseases classified elsewhere with behavioral disturbance: Secondary | ICD-10-CM | POA: Diagnosis not present

## 2017-03-07 DIAGNOSIS — G301 Alzheimer's disease with late onset: Secondary | ICD-10-CM | POA: Diagnosis not present

## 2017-03-07 DIAGNOSIS — F02818 Dementia in other diseases classified elsewhere, unspecified severity, with other behavioral disturbance: Secondary | ICD-10-CM

## 2017-03-07 MED ORDER — DIVALPROEX SODIUM 250 MG PO DR TAB: 250.0000 mg | DELAYED_RELEASE_TABLET | Freq: Two times a day (BID) | ORAL | 0 refills | Status: DC

## 2017-03-07 NOTE — Patient Instructions (Signed)
1. Start Depakote 250 mg - one tablet twice daily.

## 2017-03-12 ENCOUNTER — Emergency Department: Payer: PPO

## 2017-03-12 ENCOUNTER — Encounter: Payer: Self-pay | Admitting: Emergency Medicine

## 2017-03-12 ENCOUNTER — Emergency Department
Admission: EM | Admit: 2017-03-12 | Discharge: 2017-03-12 | Disposition: A | Discharge status: Home / Self Care | Payer: PPO | Attending: Emergency Medicine | Admitting: Emergency Medicine

## 2017-03-12 DIAGNOSIS — G309 Alzheimer's disease, unspecified: Secondary | ICD-10-CM | POA: Diagnosis not present

## 2017-03-12 DIAGNOSIS — S0101XA Laceration without foreign body of scalp, initial encounter: Secondary | ICD-10-CM | POA: Diagnosis not present

## 2017-03-12 DIAGNOSIS — W108XXA Fall (on) (from) other stairs and steps, initial encounter: Secondary | ICD-10-CM | POA: Diagnosis not present

## 2017-03-12 DIAGNOSIS — Z79899 Other long term (current) drug therapy: Secondary | ICD-10-CM | POA: Insufficient documentation

## 2017-03-12 DIAGNOSIS — W1809XA Striking against other object with subsequent fall, initial encounter: Secondary | ICD-10-CM | POA: Insufficient documentation

## 2017-03-12 DIAGNOSIS — I10 Essential (primary) hypertension: Secondary | ICD-10-CM | POA: Diagnosis not present

## 2017-03-12 DIAGNOSIS — S0190XA Unspecified open wound of unspecified part of head, initial encounter: Secondary | ICD-10-CM | POA: Diagnosis not present

## 2017-03-12 DIAGNOSIS — Z87891 Personal history of nicotine dependence: Secondary | ICD-10-CM | POA: Insufficient documentation

## 2017-03-12 DIAGNOSIS — Y9301 Activity, walking, marching and hiking: Secondary | ICD-10-CM | POA: Diagnosis not present

## 2017-03-12 DIAGNOSIS — R9431 Abnormal electrocardiogram [ECG] [EKG]: Secondary | ICD-10-CM | POA: Diagnosis not present

## 2017-03-12 DIAGNOSIS — S0990XA Unspecified injury of head, initial encounter: Secondary | ICD-10-CM | POA: Diagnosis not present

## 2017-03-12 DIAGNOSIS — S199XXA Unspecified injury of neck, initial encounter: Secondary | ICD-10-CM | POA: Diagnosis not present

## 2017-03-12 DIAGNOSIS — Y92009 Unspecified place in unspecified non-institutional (private) residence as the place of occurrence of the external cause: Secondary | ICD-10-CM | POA: Diagnosis not present

## 2017-03-12 DIAGNOSIS — W19XXXA Unspecified fall, initial encounter: Secondary | ICD-10-CM

## 2017-03-12 DIAGNOSIS — Y999 Unspecified external cause status: Secondary | ICD-10-CM | POA: Diagnosis not present

## 2017-03-12 DIAGNOSIS — Z23 Encounter for immunization: Secondary | ICD-10-CM | POA: Diagnosis not present

## 2017-03-12 LAB — CBC
HEMATOCRIT: 42.2 % (ref 35.0–47.0)
HEMOGLOBIN: 14.5 g/dL (ref 12.0–16.0)
MCH: 31.6 pg (ref 26.0–34.0)
MCHC: 34.4 g/dL (ref 32.0–36.0)
MCV: 91.9 fL (ref 80.0–100.0)
Platelets: 142 10*3/uL — ABNORMAL LOW (ref 150–440)
RBC: 4.59 MIL/uL (ref 3.80–5.20)
RDW: 13.3 % (ref 11.5–14.5)
WBC: 6.8 10*3/uL (ref 3.6–11.0)

## 2017-03-12 LAB — BASIC METABOLIC PANEL
ANION GAP: 8 (ref 5–15)
BUN: 17 mg/dL (ref 6–20)
CO2: 28 mmol/L (ref 22–32)
Calcium: 9.5 mg/dL (ref 8.9–10.3)
Chloride: 103 mmol/L (ref 101–111)
Creatinine, Ser: 0.55 mg/dL (ref 0.44–1.00)
GLUCOSE: 82 mg/dL (ref 65–99)
POTASSIUM: 4 mmol/L (ref 3.5–5.1)
Sodium: 139 mmol/L (ref 135–145)

## 2017-03-12 LAB — URINALYSIS, COMPLETE (UACMP) WITH MICROSCOPIC
BILIRUBIN URINE: NEGATIVE
Glucose, UA: NEGATIVE mg/dL
HGB URINE DIPSTICK: NEGATIVE
Ketones, ur: 5 mg/dL — AB
Leukocytes, UA: NEGATIVE
NITRITE: NEGATIVE
PROTEIN: NEGATIVE mg/dL
Specific Gravity, Urine: 1.009 (ref 1.005–1.030)
pH: 7 (ref 5.0–8.0)

## 2017-03-12 LAB — TROPONIN I

## 2017-03-12 MED ORDER — TETANUS-DIPHTH-ACELL PERTUSSIS 5-2.5-18.5 LF-MCG/0.5 IM SUSP
0.5000 mL | Freq: Once | INTRAMUSCULAR | Status: AC
  Administered 2017-03-12: 0.5 mL via INTRAMUSCULAR
  Filled 2017-03-12: qty 0.5

## 2017-03-12 NOTE — ED Notes (Signed)
Pt aware of need for UA. Given cup of ice water.

## 2017-03-12 NOTE — ED Provider Notes (Signed)
[facility] Emergency Department Provider Note  ____________________________________________  Time seen: Approximately 12:33 PM  I have reviewed the triage vital signs and the nursing notes.   HISTORY  Chief Complaint Fall and Head Laceration   HPI [name] is a 83 y.o. female with a history of hypertension, mild Alzheimers who presents for evaluation after a fall. Patient reports that she is going through a lot of issues in her personal life recently. She reports that when she gets too stressed out she gets dizzy. This morning she was stressed and felt dizzy while walking inside her home and fell backwards hitting the back of her head onto a table. She denies LOC. Unknown last tetanus shot. She denies headache, neck pain, back pain, extremity pain, chest pain, palpitations, shortness of breath, abdominal pain both preceding or after the fall. EMS was called as patient sustained a laceration on her scalp and was bleeding.She is not on blood thinners.  Past Medical History:  Diagnosis Date  . Depression   . Dizziness   . Heart murmur   . Hx of migraine headaches   . Hypertension   . RBBB   . Thrombocytopenia ([place])   . Vitamin D deficiency disease     Patient Active Problem List   Diagnosis Date Noted  . Late onset Alzheimer's disease without behavioral disturbance 09/07/2016  . Alzheimer's dementia, late onset, with behavioral disturbance 07/16/2016  . Memory impairment 03/10/2016  . B12 deficiency 02/07/2016  . Major depressive disorder, recurrent, moderate ([place]) 02/05/2016  . Vitamin D deficiency disease   . RBBB   . Thrombocytopenia ([place])   . Heart murmur   . Essential hypertension 09/05/2014    Past Surgical History:  Procedure Laterality Date  . ABDOMINAL HYSTERECTOMY    . APPENDECTOMY    . CATARACT EXTRACTION, BILATERAL    . CHOLECYSTECTOMY      Prior to Admission medications   Medication Sig Start Date End Date Taking?  Authorizing Provider  buPROPion (WELLBUTRIN XL) 300 MG 24 hr tablet Take 1 tablet (300 mg total) by mouth daily. 02/17/17  Yes Megan P Johnson, DO  divalproex (DEPAKOTE) 250 MG DR tablet Take 1 tablet (250 mg total) by mouth 2 (two) times daily. 03/07/17  Yes Rebecca S Tat, DO  donepezil (ARICEPT) 10 MG tablet Take 1 tablet (10 mg total) by mouth at bedtime. 11/29/16  Yes Rebecca S Tat, DO  QUEtiapine (SEROQUEL) 25 MG tablet Take 1 tablet (25 mg total) by mouth at bedtime. 01/24/17  Yes Megan Annia Friendly, DO    Allergies Patient has no known allergies.  Family History  Problem Relation Age of Onset  . Arthritis Mother   . Arthritis Father   . Atrial fibrillation Sister     Social History Social History  Substance Use Topics  . Smoking status: Former Smoker    Packs/day: 0.50    Years: 15.00    Types: Cigarettes    Quit date: 12/14/2007  . Smokeless tobacco: Never Used  . Alcohol use 0.0 oz/week     Comment: glass of wine every couple weeks    Review of Systems Constitutional: Negative for fever. + Lightheadedness Eyes: Negative for visual changes. ENT: Negative for facial injury or neck injury Cardiovascular: Negative for chest injury. Respiratory: Negative for shortness of breath. Negative for chest wall injury. Gastrointestinal: Negative for abdominal pain or injury. Genitourinary: Negative for dysuria. Musculoskeletal: Negative for back injury, negative for arm or leg pain. Skin: Negative for  laceration/abrasions. Neurological: + head injury.   ____________________________________________   PHYSICAL EXAM:  VITAL SIGNS: ED Triage Vitals  Enc Vitals Group     BP 03/12/17 1211 (!) 197/92     Pulse Rate 03/12/17 1211 90     Resp 03/12/17 1211 18     Temp 03/12/17 1211 98.3 F (36.8 C)     Temp Source 03/12/17 1211 Oral     SpO2 03/12/17 1211 99 %     Weight 03/12/17 1213 110 lb (49.9 kg)     Height 03/12/17 1213 5\' 4"  (1.626 m)     Head Circumference --      Peak  Flow --      Pain Score --      Pain Loc --      Pain Edu? --      Excl. in [place]? --    Full spinal precautions maintained throughout the trauma exam. Constitutional: Alert and oriented. No acute distress. Does not appear intoxicated. HEENT Head: Normocephalic, 8cm laceration on the occipital scalp Face: No facial bony tenderness. Stable midface Ears: No hemotympanum bilaterally. No Battle sign Eyes: No eye injury. PERRL. No raccoon eyes Nose: Nontender. No epistaxis. No rhinorrhea Mouth/Throat: Mucous membranes are moist. No oropharyngeal blood. No dental injury. Airway patent without stridor. Normal voice. Neck: no C-collar in place. No midline c-spine tenderness.  Cardiovascular: Normal rate, regular rhythm. Normal and symmetric distal pulses are present in all extremities. Pulmonary/Chest: Chest wall is stable and nontender to palpation/compression. Normal respiratory effort. Breath sounds are normal. No crepitus.  Abdominal: Soft, nontender, non distended. Musculoskeletal: Nontender with normal full range of motion in all extremities. No deformities. No thoracic or lumbar midline spinal tenderness. Pelvis is stable. Skin: Skin is warm, dry and intact. No abrasions or contutions. Psychiatric: Speech and behavior are appropriate. Neurological: Normal speech and language. Moves all extremities to command. No gross focal neurologic deficits are appreciated.  Glascow Coma Score: 4 - Opens eyes on own 6 - Follows simple motor commands 5 - Alert and oriented GCS: 15   ____________________________________________   LABS (all labs ordered are listed, but only abnormal results are displayed)  Labs Reviewed  CBC - Abnormal; Notable for the following:       Result Value   Platelets 142 (*)    All other components within normal limits  URINALYSIS, COMPLETE (UACMP) WITH MICROSCOPIC - Abnormal; Notable for the following:    Color, Urine YELLOW (*)    APPearance HAZY (*)    Ketones, ur  5 (*)    Bacteria, UA FEW (*)    Squamous Epithelial / LPF 0-5 (*)    All other components within normal limits  URINE CULTURE  BASIC METABOLIC PANEL  TROPONIN I   ____________________________________________  EKG  ED ECG REPORT I, Rudene Re, the attending physician, personally viewed and interpreted this ECG.  Sinus rhythm with occasional PVCs, rate of 91, prolonged QTC, right bundle branch block, LAFB, no STE or depressions. Unchanged from prior from 2015  ____________________________________________  RADIOLOGY  CT head and cspine: 1. Negative for bleed or other acute intracranial process. 2. Atrophy and nonspecific white matter changes as before. 3. No acute cervical spine pathology. 4. Multilevel cervical degenerative change C2-C7 as above.  ____________________________________________   PROCEDURES  Procedure(s) performed: yes .Marland KitchenLaceration Repair Date/Time: 03/12/2017 1:00 PM Performed by: Rudene Re Authorized by: Rudene Re   Consent:    Consent obtained:  Verbal   Consent given by:  Patient  Risks discussed:  Infection, pain and retained foreign body Anesthesia (see MAR for exact dosages):    Anesthesia method:  None Laceration details:    Location:  Scalp   Scalp location:  Occipital   Length (cm):  8 Repair type:    Repair type:  Simple Pre-procedure details:    Preparation:  Patient was prepped and draped in usual sterile fashion Exploration:    Hemostasis achieved with:  Direct pressure   Wound exploration: entire depth of wound probed and visualized     Wound extent: no fascia violation noted and no foreign bodies/material noted     Contaminated: no   Treatment:    Area cleansed with:  Saline   Amount of cleaning:  Standard   Irrigation method:  Pressure wash Skin repair:    Repair method:  Staples   Number of staples:  9 Approximation:    Approximation:  Close   [place] border: well-aligned   Post-procedure  details:    Patient tolerance of procedure:  Tolerated well, no immediate complications   Critical Care performed:  None ____________________________________________   INITIAL IMPRESSION / ASSESSMENT AND PLAN / ED COURSE   83 y.o. female with a history of hypertension, mild Alzheimers who presents for evaluation after a fall preceded by lightheadedness. Patient  sustained a 8 cm laceration her occipital scalp. Unknown tetanus shot. We'll renew tetanus shot and repair the laceration with staples. We'll do head CT and CT cervical spine. Since patient felt dizzy prior to the fall will watch patient for cardiac telemetry, with do EKG, and basic labs to rule out any alternative etiologies to her fall such as dehydration, electrolyte abnormalities, arrhythmias.   Clinical Course as of Mar 12 1514  Sat Mar 12, 2017  1511 Labs in no acute findings. Patient was monitored on telemetry for 3 hours with no evidence of arrhythmia. CT head and cervical spine with no acute findings. Laceration was repaired with staples per procedure note above. Patient be discharged home on supportive care.  [CV]    Clinical Course User Index [CV] Rudene Re, MD    Pertinent labs & imaging results that were available during my care of the patient were reviewed by me and considered in my medical decision making (see chart for details).    ____________________________________________   FINAL CLINICAL IMPRESSION(S) / ED DIAGNOSES  Final diagnoses:  Fall  Laceration of scalp, initial encounter      NEW MEDICATIONS STARTED DURING THIS VISIT:  New Prescriptions   No medications on file     Note:  This document was prepared using Dragon voice recognition software and may include unintentional dictation errors.    Rudene Re, MD 03/12/17 [phone]

## 2017-03-12 NOTE — Discharge Instructions (Signed)
Keep laceration dry and clean. Wash with warm water and soap. Apply topical bacitracin. You received 9 staples that must be removed in 7 days.  Watch for signs of infection: pus, redness of the skin surrounding it, or fever. If these develop see your doctor or return to the ER for antibiotics.  You were seen in the emergency department after a fall. Follow-up with you doctor within the next 2-3 days for further evaluation. Sometimes injuries can present at a later time and therefore it is imperative that you return to the emergency room if you have a severe headache, facial droop, neck pain, numbness or weakness of your extremities, slurred speech, difficulty finding words, chest pain, back pain, abdominal pain, or any other new symptoms that were not present during this visit. You may take Tylenol at home for your pain.

## 2017-03-12 NOTE — ED Triage Notes (Signed)
Pt presents to ED via [facility] from home c/o fall with head lac. Pt states she has been under a lot of stress and when that happens her BP goes up and she gets dizzy. States she was walking up steps, became dizzy, and fell backwards hitting head on glass table. Per EMS, pt has hx alzheimer's, on aricept, A&Ox4 on assessment. Pt states she is concerned about her husband, per EMS pt's husband has passed away. Pt has home health aide who is not to visit pt while she is in the ED. First nurse notified.

## 2017-03-12 NOTE — ED Notes (Signed)
[name], EDT at bedside at this time to clean wound, Dr. Alfred Levins at bedside at this time. Will continue to monitor for further patient needs.

## 2017-03-12 NOTE — ED Notes (Signed)
Pt assisted to the bathroom by this RN. NAD noted, pt tolerated well.

## 2017-03-12 NOTE — ED Notes (Signed)
Pt resting in bed with family at bedside. Pt visualized in NAD skin warm, dry, and intact, respirations even and unlabored. Denies any needs, will continue to monitor for further patient needs.

## 2017-03-14 LAB — URINE CULTURE

## 2017-03-21 ENCOUNTER — Encounter: Payer: Self-pay | Admitting: Family Medicine

## 2017-03-21 ENCOUNTER — Ambulatory Visit (INDEPENDENT_AMBULATORY_CARE_PROVIDER_SITE_OTHER): Payer: PPO | Admitting: Family Medicine

## 2017-03-21 VITALS — BP 102/68 | HR 85 | Temp 98.7°F | Resp 17 | Ht 64.0 in | Wt 102.0 lb

## 2017-03-21 DIAGNOSIS — G301 Alzheimer's disease with late onset: Secondary | ICD-10-CM

## 2017-03-21 DIAGNOSIS — Z13220 Encounter for screening for lipoid disorders: Secondary | ICD-10-CM | POA: Diagnosis not present

## 2017-03-21 DIAGNOSIS — F331 Major depressive disorder, recurrent, moderate: Secondary | ICD-10-CM | POA: Diagnosis not present

## 2017-03-21 DIAGNOSIS — F0280 Dementia in other diseases classified elsewhere without behavioral disturbance: Secondary | ICD-10-CM

## 2017-03-21 DIAGNOSIS — E538 Deficiency of other specified B group vitamins: Secondary | ICD-10-CM

## 2017-03-21 DIAGNOSIS — I10 Essential (primary) hypertension: Secondary | ICD-10-CM

## 2017-03-21 DIAGNOSIS — Z111 Encounter for screening for respiratory tuberculosis: Secondary | ICD-10-CM

## 2017-03-21 DIAGNOSIS — D696 Thrombocytopenia, unspecified: Secondary | ICD-10-CM

## 2017-03-21 DIAGNOSIS — Z4802 Encounter for removal of sutures: Secondary | ICD-10-CM

## 2017-03-21 DIAGNOSIS — E559 Vitamin D deficiency, unspecified: Secondary | ICD-10-CM | POA: Diagnosis not present

## 2017-03-21 MED ORDER — CYANOCOBALAMIN 1000 MCG/ML IJ SOLN
1000.0000 ug | Freq: Once | INTRAMUSCULAR | Status: AC
  Administered 2017-03-21: 1000 ug via INTRAMUSCULAR

## 2017-03-21 NOTE — Assessment & Plan Note (Signed)
Stable. To be living in SNF. Call with any concerns. Forms getting filled out pending labs.

## 2017-03-21 NOTE — Assessment & Plan Note (Signed)
Rechecking levels today. Await results. Continue to monitor.  

## 2017-03-21 NOTE — Progress Notes (Signed)
BP 102/68 (BP Location: Left Arm, Patient Position: Sitting, Cuff Size: Normal)   Pulse 85   Temp 98.7 F (37.1 C) (Oral)   Resp 17   Ht 5\' 4"  (1.626 m)   Wt 102 lb (46.3 kg)   SpO2 100%   BMI 17.51 kg/m    Subjective:    Patient ID: [name], female    DOB: 07/27/1934, 83 y.o.   MRN: [id number]  HPI: [name] is a 83 y.o. female  Chief Complaint  Patient presents with  . Suture / Staple Removal   ER FOLLOW UP Time since discharge: 10 days Hospital/facility: [facility] Diagnosis: fall with laceration Procedures/tests: labs, EKG, CXR, CT head and neck- all normal Consultants: None New medications: None Discharge instructions: Follow up here  Status: better  HYPERTENSION Hypertension status: controlled  Satisfied with current treatment? yes Duration of hypertension: chronic BP monitoring frequency:  rarely  Medication compliance: Not on anything Aspirin: no Recurrent headaches: no Visual changes: no Palpitations: no Dyspnea: no Chest pain: no Lower extremity edema: no Dizzy/lightheaded: yes  To be going to [facility]   Active Ambulatory Problems    Diagnosis Date Noted  . Essential hypertension 09/05/2014  . Vitamin D deficiency disease   . RBBB   . Thrombocytopenia ([place])   . Heart murmur   . Major depressive disorder, recurrent, moderate ([place]) 02/05/2016  . B12 deficiency 02/07/2016  . Late onset Alzheimer's disease without behavioral disturbance 09/07/2016   Resolved Ambulatory Problems    Diagnosis Date Noted  . Dizziness 09/05/2014  . Anxiety and depression 09/05/2014  . Mastoid disorder 09/05/2014  . Depression   . Hypertension   . Memory impairment 03/10/2016  . Alzheimer's dementia, late onset, with behavioral disturbance 07/16/2016   Past Medical History:  Diagnosis Date  . Depression   . Dizziness   . Heart murmur   . Hx of migraine headaches   . Hypertension   . RBBB   . Thrombocytopenia ([place])   . Vitamin D deficiency  disease    Past Surgical History:  Procedure Laterality Date  . ABDOMINAL HYSTERECTOMY    . APPENDECTOMY    . CATARACT EXTRACTION, BILATERAL    . CHOLECYSTECTOMY     Outpatient Encounter Prescriptions as of 03/21/2017  Medication Sig  . buPROPion (WELLBUTRIN XL) 300 MG 24 hr tablet Take 1 tablet (300 mg total) by mouth daily.  . divalproex (DEPAKOTE) 250 MG DR tablet Take 1 tablet (250 mg total) by mouth 2 (two) times daily.  Marland Kitchen donepezil (ARICEPT) 10 MG tablet Take 1 tablet (10 mg total) by mouth at bedtime.  Marland Kitchen QUEtiapine (SEROQUEL) 25 MG tablet Take 1 tablet (25 mg total) by mouth at bedtime.  . [EXPIRED] cyanocobalamin ((VITAMIN B-12)) injection 1,000 mcg    No facility-administered encounter medications on file as of 03/21/2017.    No Known Allergies  Social History   Social History  . Marital status: Unknown    Spouse name: N/A  . Number of children: N/A  . Years of education: N/A   Occupational History  . retired     Dance movement psychotherapist   Social History Main Topics  . Smoking status: Former Smoker    Packs/day: 0.50    Years: 15.00    Types: Cigarettes    Quit date: 12/14/2007  . Smokeless tobacco: Never Used  . Alcohol use 0.0 oz/week     Comment: glass of wine every couple weeks  . Drug use: No  .  Sexual activity: Not on file   Other Topics Concern  . Not on file   Social History Narrative  . No narrative on file   Family History  Problem Relation Age of Onset  . Arthritis Mother   . Arthritis Father   . Atrial fibrillation Sister      Review of Systems  Constitutional: Negative.   Respiratory: Negative.   Cardiovascular: Negative.   Gastrointestinal: Negative.   Musculoskeletal: Negative.   Neurological: Positive for dizziness and headaches. Negative for tremors, seizures, syncope, facial asymmetry, speech difficulty, weakness, light-headedness and numbness.  Psychiatric/Behavioral: Negative.     Per HPI unless specifically indicated above       Objective:    BP 102/68 (BP Location: Left Arm, Patient Position: Sitting, Cuff Size: Normal)   Pulse 85   Temp 98.7 F (37.1 C) (Oral)   Resp 17   Ht 5\' 4"  (1.626 m)   Wt 102 lb (46.3 kg)   SpO2 100%   BMI 17.51 kg/m   Wt Readings from Last 3 Encounters:  03/21/17 102 lb (46.3 kg)  03/12/17 110 lb (49.9 kg)  03/07/17 107 lb (48.5 kg)    Physical Exam  Constitutional: She is oriented to person, place, and time. She appears well-developed and well-nourished. No distress.  HENT:  Head: Normocephalic.    Right Ear: Hearing, tympanic membrane, external ear and ear canal normal.  Left Ear: Hearing, tympanic membrane, external ear and ear canal normal.  Nose: Nose normal.  Mouth/Throat: Uvula is midline, oropharynx is clear and moist and mucous membranes are normal. No oropharyngeal exudate.  Eyes: Conjunctivae, EOM and lids are normal. Pupils are equal, round, and reactive to light. Right eye exhibits no discharge. Left eye exhibits no discharge. No scleral icterus.  Neck: Normal range of motion. Neck supple. No JVD present. No tracheal deviation present. No thyromegaly present.  Cardiovascular: Normal rate, regular rhythm, normal heart sounds and intact distal pulses.  Exam reveals no gallop and no friction rub.   No murmur heard. Pulmonary/Chest: Effort normal and breath sounds normal. No stridor. No respiratory distress. She has no wheezes. She has no rales. She exhibits no tenderness.  Abdominal: Soft. Bowel sounds are normal. She exhibits no distension and no mass. There is no tenderness. There is no rebound and no guarding.  Genitourinary:  Genitourinary Comments: Breast, Pelvic and rectal exams deferred with shared decision making  Musculoskeletal: Normal range of motion. She exhibits no edema, tenderness or deformity.  Lymphadenopathy:    She has no cervical adenopathy.  Neurological: She is alert and oriented to person, place, and time. She displays normal reflexes. No  cranial nerve deficit. She exhibits normal muscle tone. Coordination normal.  Skin: Skin is warm, dry and intact. No rash noted. She is not diaphoretic. No erythema. No pallor.  Psychiatric: She has a normal mood and affect. Her speech is normal and behavior is normal. Judgment and thought content normal. Cognition and memory are impaired.  Nursing note and vitals reviewed.   Results for orders placed or performed during the hospital encounter of 03/12/17  Urine culture  Result Value Ref Range   Specimen Description URINE, RANDOM    Special Requests NONE    Culture MULTIPLE SPECIES PRESENT, SUGGEST RECOLLECTION (A)    Report Status 03/14/2017 FINAL   Basic metabolic panel  Result Value Ref Range   Sodium 139 135 - 145 mmol/L   Potassium 4.0 3.5 - 5.1 mmol/L   Chloride 103 101 - 111 mmol/L  CO2 28 22 - 32 mmol/L   Glucose, Bld 82 65 - 99 mg/dL   BUN 17 6 - 20 mg/dL   Creatinine, Ser 0.55 0.44 - 1.00 mg/dL   Calcium 9.5 8.9 - 10.3 mg/dL   GFR calc non Af Amer >60 >60 mL/min   GFR calc Af Amer >60 >60 mL/min   Anion gap 8 5 - 15  CBC  Result Value Ref Range   WBC 6.8 3.6 - 11.0 K/uL   RBC 4.59 3.80 - 5.20 MIL/uL   Hemoglobin 14.5 12.0 - 16.0 g/dL   HCT 42.2 35.0 - 47.0 %   MCV 91.9 80.0 - 100.0 fL   MCH 31.6 26.0 - 34.0 pg   MCHC 34.4 32.0 - 36.0 g/dL   RDW 13.3 11.5 - 14.5 %   Platelets 142 (L) 150 - 440 K/uL  Urinalysis, Complete w Microscopic  Result Value Ref Range   Color, Urine YELLOW (A) YELLOW   APPearance HAZY (A) CLEAR   Specific Gravity, Urine 1.009 1.005 - 1.030   pH 7.0 5.0 - 8.0   Glucose, UA NEGATIVE NEGATIVE mg/dL   Hgb urine dipstick NEGATIVE NEGATIVE   Bilirubin Urine NEGATIVE NEGATIVE   Ketones, ur 5 (A) NEGATIVE mg/dL   Protein, ur NEGATIVE NEGATIVE mg/dL   Nitrite NEGATIVE NEGATIVE   Leukocytes, UA NEGATIVE NEGATIVE   RBC / HPF 0-5 0 - 5 RBC/hpf   WBC, UA 0-5 0 - 5 WBC/hpf   Bacteria, UA FEW (A) NONE SEEN   Squamous Epithelial / LPF 0-5 (A) NONE  SEEN  Troponin I  Result Value Ref Range   Troponin I <0.03 <0.03 ng/mL      Assessment & Plan:   Problem List Items Addressed This Visit      Cardiovascular and Mediastinum   Essential hypertension - Primary    Under good control off medicine. Continue to monitor. Call with any concerns.       Relevant Orders   Comprehensive metabolic panel   Microalbumin, Urine Waived   TSH   UA/M w/rflx Culture, Routine     Nervous and Auditory   Late onset Alzheimer's disease without behavioral disturbance    Stable. To be living in SNF. Call with any concerns. Forms getting filled out pending labs.         Other   Vitamin D deficiency disease    Rechecking levels today. Await results. Continue to monitor.       Relevant Orders   Comprehensive metabolic panel   Thrombocytopenia (HCC)    Rechecking levels today. Await results. Continue to monitor.       Relevant Orders   CBC with Differential/Platelet   Major depressive disorder, recurrent, moderate (HCC)    Under good control. Continue current regimen. Continue to monitor.       Relevant Orders   Comprehensive metabolic panel   D78 deficiency    Rechecking levels today. Await results. Continue to monitor.       Relevant Medications   cyanocobalamin ((VITAMIN B-12)) injection 1,000 mcg (Completed)   Other Relevant Orders   CBC with Differential/Platelet    Other Visit Diagnoses    Screening for tuberculosis       Labs drawn today. Await results.    Relevant Orders   Quantiferon tb gold assay   Screening for cholesterol level       Labs drawn today. Await results.    Relevant Orders   Lipid Panel w/o Chol/HDL Ratio   Encounter  for staple removal       9 staples removed today. Healing well.        Follow up plan: Return in about 3 months (around 06/20/2017).

## 2017-03-21 NOTE — Assessment & Plan Note (Signed)
Under good control off medicine. Continue to monitor. Call with any concerns.

## 2017-03-21 NOTE — Assessment & Plan Note (Signed)
Under good control. Continue current regimen. Continue to monitor.  

## 2017-03-22 LAB — CBC WITH DIFFERENTIAL/PLATELET
BASOS ABS: 0 10*3/uL (ref 0.0–0.2)
Basos: 1 %
EOS (ABSOLUTE): 0.1 10*3/uL (ref 0.0–0.4)
Eos: 2 %
Hematocrit: 37.6 % (ref 34.0–46.6)
Hemoglobin: 13 g/dL (ref 11.1–15.9)
IMMATURE GRANS (ABS): 0 10*3/uL (ref 0.0–0.1)
Immature Granulocytes: 0 %
LYMPHS: 24 %
Lymphocytes Absolute: 1.4 10*3/uL (ref 0.7–3.1)
MCH: 31.1 pg (ref 26.6–33.0)
MCHC: 34.6 g/dL (ref 31.5–35.7)
MCV: 90 fL (ref 79–97)
MONOS ABS: 0.7 10*3/uL (ref 0.1–0.9)
Monocytes: 11 %
NEUTROS ABS: 3.8 10*3/uL (ref 1.4–7.0)
Neutrophils: 62 %
PLATELETS: 177 10*3/uL (ref 150–379)
RBC: 4.18 x10E6/uL (ref 3.77–5.28)
RDW: 13 % (ref 12.3–15.4)
WBC: 6 10*3/uL (ref 3.4–10.8)

## 2017-03-22 LAB — COMPREHENSIVE METABOLIC PANEL
A/G RATIO: 2.1 (ref 1.2–2.2)
ALK PHOS: 113 IU/L (ref 39–117)
ALT: 17 IU/L (ref 0–32)
AST: 19 IU/L (ref 0–40)
Albumin: 4.2 g/dL (ref 3.5–4.7)
BILIRUBIN TOTAL: 0.6 mg/dL (ref 0.0–1.2)
BUN/Creatinine Ratio: 21 (ref 12–28)
BUN: 12 mg/dL (ref 8–27)
CO2: 29 mmol/L (ref 18–29)
Calcium: 9.4 mg/dL (ref 8.7–10.3)
Chloride: 104 mmol/L (ref 96–106)
Creatinine, Ser: 0.57 mg/dL (ref 0.57–1.00)
GFR calc Af Amer: 101 mL/min/{1.73_m2} (ref >59)
GFR calc non Af Amer: 88 mL/min/{1.73_m2} (ref >59)
Globulin, Total: 2 g/dL (ref 1.5–4.5)
Glucose: 69 mg/dL (ref 65–99)
POTASSIUM: 4.7 mmol/L (ref 3.5–5.2)
Sodium: 146 mmol/L — ABNORMAL HIGH (ref 134–144)
Total Protein: 6.2 g/dL (ref 6.0–8.5)

## 2017-03-22 LAB — LIPID PANEL W/O CHOL/HDL RATIO
CHOLESTEROL TOTAL: 179 mg/dL (ref 100–199)
HDL: 63 mg/dL (ref >39)
LDL Calculated: 103 mg/dL — ABNORMAL HIGH (ref 0–99)
Triglycerides: 67 mg/dL (ref 0–149)
VLDL CHOLESTEROL CAL: 13 mg/dL (ref 5–40)

## 2017-03-22 LAB — TSH: TSH: 0.879 u[IU]/mL (ref 0.450–4.500)

## 2017-03-23 LAB — QUANTIFERON IN TUBE
QUANTIFERON TB AG VALUE: 0.02 [IU]/mL
QUANTIFERON TB GOLD: NEGATIVE
Quantiferon Nil Value: 0.03 IU/mL

## 2017-03-23 LAB — QUANTIFERON TB GOLD ASSAY (BLOOD)

## 2017-04-27 ENCOUNTER — Telehealth: Payer: Self-pay | Admitting: Family Medicine

## 2017-04-27 NOTE — Telephone Encounter (Signed)
Routing to provider  

## 2017-04-27 NOTE — Telephone Encounter (Signed)
Patient has been placed in [facility] and for 2 days has not eaten and is very agitated.  Linda with [facility] would like to know if Dr Wynetta Emery could prescribe the patient something to help with the anxiety.Joylene Igo [phone] Phone [phone] [facility]

## 2017-04-28 MED ORDER — QUETIAPINE FUMARATE 25 MG PO TABS: ORAL_TABLET | ORAL | 3 refills | Status: DC

## 2017-04-28 NOTE — Telephone Encounter (Signed)
Telephone encounter printed, provider signed and faxed to [facility]. Provider also sent in new RX for her Seroquel with updated SIG.

## 2017-04-28 NOTE — Telephone Encounter (Addendum)
They can increase her seroquel to 1/2-1 tab BID PRN during the day for agitation/anxiety. Let me know if it doesn't help.

## 2017-04-30 ENCOUNTER — Encounter: Payer: Self-pay | Admitting: Emergency Medicine

## 2017-04-30 ENCOUNTER — Emergency Department
Admission: EM | Admit: 2017-04-30 | Discharge: 2017-04-30 | Disposition: A | Discharge status: Home / Self Care | Payer: PPO | Attending: Emergency Medicine | Admitting: Emergency Medicine

## 2017-04-30 ENCOUNTER — Emergency Department: Payer: PPO

## 2017-04-30 DIAGNOSIS — I447 Left bundle-branch block, unspecified: Secondary | ICD-10-CM | POA: Diagnosis not present

## 2017-04-30 DIAGNOSIS — W01198A Fall on same level from slipping, tripping and stumbling with subsequent striking against other object, initial encounter: Secondary | ICD-10-CM | POA: Insufficient documentation

## 2017-04-30 DIAGNOSIS — S199XXA Unspecified injury of neck, initial encounter: Secondary | ICD-10-CM | POA: Diagnosis not present

## 2017-04-30 DIAGNOSIS — Z87891 Personal history of nicotine dependence: Secondary | ICD-10-CM | POA: Insufficient documentation

## 2017-04-30 DIAGNOSIS — Y929 Unspecified place or not applicable: Secondary | ICD-10-CM | POA: Diagnosis not present

## 2017-04-30 DIAGNOSIS — Z7401 Bed confinement status: Secondary | ICD-10-CM | POA: Diagnosis not present

## 2017-04-30 DIAGNOSIS — S0081XA Abrasion of other part of head, initial encounter: Secondary | ICD-10-CM | POA: Insufficient documentation

## 2017-04-30 DIAGNOSIS — Y9301 Activity, walking, marching and hiking: Secondary | ICD-10-CM | POA: Insufficient documentation

## 2017-04-30 DIAGNOSIS — Y999 Unspecified external cause status: Secondary | ICD-10-CM | POA: Diagnosis not present

## 2017-04-30 DIAGNOSIS — Z79899 Other long term (current) drug therapy: Secondary | ICD-10-CM | POA: Diagnosis not present

## 2017-04-30 DIAGNOSIS — I10 Essential (primary) hypertension: Secondary | ICD-10-CM | POA: Diagnosis not present

## 2017-04-30 DIAGNOSIS — S0990XA Unspecified injury of head, initial encounter: Secondary | ICD-10-CM | POA: Diagnosis not present

## 2017-04-30 DIAGNOSIS — R220 Localized swelling, mass and lump, head: Secondary | ICD-10-CM | POA: Diagnosis not present

## 2017-04-30 DIAGNOSIS — W19XXXA Unspecified fall, initial encounter: Secondary | ICD-10-CM

## 2017-04-30 LAB — CBC WITH DIFFERENTIAL/PLATELET
BASOS ABS: 0 10*3/uL (ref 0–0.1)
Basophils Relative: 1 %
EOS ABS: 0 10*3/uL (ref 0–0.7)
EOS PCT: 1 %
HCT: 38.9 % (ref 35.0–47.0)
Hemoglobin: 13.4 g/dL (ref 12.0–16.0)
LYMPHS PCT: 25 %
Lymphs Abs: 1.4 10*3/uL (ref 1.0–3.6)
MCH: 31.9 pg (ref 26.0–34.0)
MCHC: 34.5 g/dL (ref 32.0–36.0)
MCV: 92.5 fL (ref 80.0–100.0)
Monocytes Absolute: 0.5 10*3/uL (ref 0.2–0.9)
Monocytes Relative: 10 %
Neutro Abs: 3.5 10*3/uL (ref 1.4–6.5)
Neutrophils Relative %: 63 %
PLATELETS: 136 10*3/uL — AB (ref 150–440)
RBC: 4.2 MIL/uL (ref 3.80–5.20)
RDW: 13.2 % (ref 11.5–14.5)
WBC: 5.4 10*3/uL (ref 3.6–11.0)

## 2017-04-30 LAB — URINALYSIS, COMPLETE (UACMP) WITH MICROSCOPIC
BACTERIA UA: NONE SEEN
Bilirubin Urine: NEGATIVE
GLUCOSE, UA: NEGATIVE mg/dL
HGB URINE DIPSTICK: NEGATIVE
KETONES UR: 5 mg/dL — AB
Leukocytes, UA: NEGATIVE
Nitrite: NEGATIVE
Protein, ur: NEGATIVE mg/dL
Specific Gravity, Urine: 1.012 (ref 1.005–1.030)
pH: 7 (ref 5.0–8.0)

## 2017-04-30 LAB — BASIC METABOLIC PANEL
ANION GAP: 11 (ref 5–15)
BUN: 7 mg/dL (ref 6–20)
CO2: 26 mmol/L (ref 22–32)
Calcium: 9.2 mg/dL (ref 8.9–10.3)
Chloride: 104 mmol/L (ref 101–111)
Creatinine, Ser: 0.59 mg/dL (ref 0.44–1.00)
GFR calc Af Amer: >60 mL/min (ref >60)
Glucose, Bld: 94 mg/dL (ref 65–99)
POTASSIUM: 3.6 mmol/L (ref 3.5–5.1)
SODIUM: 141 mmol/L (ref 135–145)

## 2017-04-30 LAB — TROPONIN I: Troponin I: <0.03 ng/mL (ref <0.03)

## 2017-04-30 NOTE — Discharge Instructions (Signed)

## 2017-04-30 NOTE — ED Provider Notes (Addendum)
[facility] Emergency Department Provider Note  ____________________________________________  Time seen: Approximately 8:24 PM  I have reviewed the triage vital signs and the nursing notes.   HISTORY  Chief Complaint Fall   HPI [name] is a 83 y.o. female with a historyof hypertension and mild Alzheimer's who presents for evaluation after mechanical fall. Patient reports that she was walking when she tripped and fell. She hit her head onto a concrete floor. She sustained an abrasion to her left forehead. She denies LOC. She is not in any blood thinners. She denies headache, neck pain, back pain, chest pain, abdominal pain, palpitations, extremity pain both preceding or after the fall. According to her family members she hasn't been eating or drinking much over the course of the last week. Patient tells me that she just does not have an appetite because she feels stressed out. She has a son with Down syndrome and she tells me that he is not doing so well. I saw her a month and a half ago for a fall. Patient does not have a walker or cane.  Past Medical History:  Diagnosis Date  . Depression   . Dizziness   . Heart murmur   . Hx of migraine headaches   . Hypertension   . RBBB   . Thrombocytopenia ([place])   . Vitamin D deficiency disease     Patient Active Problem List   Diagnosis Date Noted  . Late onset Alzheimer's disease without behavioral disturbance 09/07/2016  . B12 deficiency 02/07/2016  . Major depressive disorder, recurrent, moderate ([place]) 02/05/2016  . Vitamin D deficiency disease   . RBBB   . Thrombocytopenia ([place])   . Heart murmur   . Essential hypertension 09/05/2014    Past Surgical History:  Procedure Laterality Date  . ABDOMINAL HYSTERECTOMY    . APPENDECTOMY    . CATARACT EXTRACTION, BILATERAL    . CHOLECYSTECTOMY      Prior to Admission medications   Medication Sig Start Date End Date Taking? Authorizing Provider    buPROPion (WELLBUTRIN XL) 300 MG 24 hr tablet Take 1 tablet (300 mg total) by mouth daily. 02/17/17   Johnson, Megan P, DO  divalproex (DEPAKOTE) 250 MG DR tablet Take 1 tablet (250 mg total) by mouth 2 (two) times daily. 03/07/17   Tat, Eustace Quail, DO  donepezil (ARICEPT) 10 MG tablet Take 1 tablet (10 mg total) by mouth at bedtime. 11/29/16   Tat, Eustace Quail, DO  QUEtiapine (SEROQUEL) 25 MG tablet 1/2-1 tab BID PRN anxiety and 1 tab qHS 04/28/17   Johnson, Megan P, DO    Allergies Patient has no known allergies.  Family History  Problem Relation Age of Onset  . Arthritis Mother   . Arthritis Father   . Atrial fibrillation Sister     Social History Social History  Substance Use Topics  . Smoking status: Former Smoker    Packs/day: 0.50    Years: 15.00    Types: Cigarettes    Quit date: 12/14/2007  . Smokeless tobacco: Never Used  . Alcohol use 0.0 oz/week     Comment: glass of wine every couple weeks    Review of Systems Constitutional: Negative for fever. Eyes: Negative for visual changes. ENT: Negative for facial injury or neck injury Cardiovascular: Negative for chest injury. Respiratory: Negative for shortness of breath. Negative for chest wall injury. Gastrointestinal: Negative for abdominal pain or injury. Genitourinary: Negative for dysuria. Musculoskeletal: Negative for back injury, negative  for arm or leg pain. Skin: + forehead abrasion Neurological: Negative for head injury.   ____________________________________________   PHYSICAL EXAM:  VITAL SIGNS: ED Triage Vitals  Enc Vitals Group     BP 04/30/17 1941 (!) 165/89     Pulse Rate 04/30/17 1941 96     Resp 04/30/17 1941 18     Temp 04/30/17 1941 98.1 F (36.7 C)     Temp Source 04/30/17 1941 Oral     SpO2 04/30/17 1941 96 %     Weight 04/30/17 1943 115 lb (52.2 kg)     Height 04/30/17 1943 5\' 3"  (1.6 m)     Head Circumference --      Peak Flow --      Pain Score 04/30/17 1940 5     Pain Loc --       Pain Edu? --      Excl. in [place]? --     Constitutional: Alert and oriented. No acute distress. Does not appear intoxicated. HEENT Head: Normocephalic and atraumatic. Face: No facial bony tenderness. Stable midface Ears: No hemotympanum bilaterally. No Battle sign Eyes: No eye injury. PERRL. No raccoon eyes Nose: Nontender. No epistaxis. No rhinorrhea Mouth/Throat: Mucous membranes are moist. No oropharyngeal blood. No dental injury. Airway patent without stridor. Normal voice. Neck:  No C-collar in place. No midline c-spine tenderness.  Cardiovascular: Normal rate, regular rhythm. Normal and symmetric distal pulses are present in all extremities. Pulmonary/Chest: Chest wall is stable and nontender to palpation/compression. Normal respiratory effort. Breath sounds are normal. No crepitus.  Abdominal: Soft, nontender, non distended. Musculoskeletal: Nontender with normal full range of motion in all extremities. No deformities. No thoracic or lumbar midline spinal tenderness. Pelvis is stable. Skin: Skin is warm, dry and intact. Abrasion to the left forehead  Psychiatric: Speech and behavior are appropriate. Neurological: Normal speech and language. Moves all extremities to command. No gross focal neurologic deficits are appreciated.  Glascow Coma Score: 4 - Opens eyes on own 6 - Follows simple motor commands 5 - Alert and oriented GCS: 15   ____________________________________________   LABS (all labs ordered are listed, but only abnormal results are displayed)  Labs Reviewed  CBC WITH DIFFERENTIAL/PLATELET - Abnormal; Notable for the following:       Result Value   Platelets 136 (*)    All other components within normal limits  URINALYSIS, COMPLETE (UACMP) WITH MICROSCOPIC - Abnormal; Notable for the following:    Color, Urine YELLOW (*)    APPearance CLEAR (*)    Ketones, ur 5 (*)    Squamous Epithelial / LPF 0-5 (*)    All other components within normal limits  BASIC  METABOLIC PANEL  TROPONIN I   ____________________________________________  EKG  ED ECG REPORT I, Rudene Re, the attending physician, personally viewed and interpreted this ECG.  Normal sinus rhythm, rate of 97, left bundle branch block, prolonged QTC, left axis deviation, no ST elevations or depressions. EKG unchanged from prior from March 2018 ____________________________________________  RADIOLOGY  CT head and cspine: Mild soft tissue swelling overlying the left frontal bone. No evidence of calvarial fracture. No evidence of acute intracranial abnormality.  No evidence of traumatic injury to the cervical spine. ____________________________________________   PROCEDURES  Procedure(s) performed: None Procedures Critical Care performed:  None ____________________________________________   INITIAL IMPRESSION / ASSESSMENT AND PLAN / ED COURSE   83 y.o. female with a historyof hypertension and mild Alzheimer's who presents for evaluation after mechanical fall with abrasion to  forehead. No LOC. However since patient has mild dementia and according to the family has not been eating well over the course of the last week with checked basic blood work to rule out electrolyte abnormalities, dehydration, UTI. We'll do head CT and CT cervical spine. No other injuries identified on history and exam. Patient received a tetanus shot back in March when she was seen in the emergency room.     _________________________ 8:38 PM on 04/30/2017 -----------------------------------------  CT head and neck with no acute findings. Blood work and urinalysis with no acute findings.  Patient was given a walker to help prevent further falls. Patient remained stable and at her baseline. Ambulating without difficulty. She'll be discharged home to her facility.  Pertinent labs & imaging results that were available during my care of the patient were reviewed by me and considered in my medical  decision making (see chart for details).    ____________________________________________   FINAL CLINICAL IMPRESSION(S) / ED DIAGNOSES  Final diagnoses:  Fall, initial encounter  Abrasion of forehead, initial encounter      NEW MEDICATIONS STARTED DURING THIS VISIT:  New Prescriptions   No medications on file     Note:  This document was prepared using Dragon voice recognition software and may include unintentional dictation errors.    Rudene Re, MD 04/30/17 2038    Rudene Re, MD 04/30/17 2110

## 2017-04-30 NOTE — ED Triage Notes (Signed)
Patient comes in from [place] from [place] with an unwitnessed fall. Patient states she tripped and fell, denies LOC. Patient has abrasion to the left forehead. Per EMS patient has dementia, her husband is deceased but keeps asking for him and she has not ate ince yesterday because she thinks she has to pay for the food there. CBG per EMS was 84. Per EMS patient is a walker at [facility] [employer].

## 2017-05-03 ENCOUNTER — Telehealth: Payer: Self-pay | Admitting: Family Medicine

## 2017-05-03 DIAGNOSIS — G301 Alzheimer's disease with late onset: Principal | ICD-10-CM

## 2017-05-03 DIAGNOSIS — F0280 Dementia in other diseases classified elsewhere without behavioral disturbance: Secondary | ICD-10-CM

## 2017-05-04 NOTE — Telephone Encounter (Signed)
OK to give orders for home health.

## 2017-05-04 NOTE — Telephone Encounter (Signed)
Orders given.  

## 2017-05-04 NOTE — Telephone Encounter (Signed)
Please see message from front desk staff. Home health referral T'd up with new start date if appropriate. Please advise.

## 2017-05-04 NOTE — Telephone Encounter (Signed)
Left message on machine for [name] to return call to the office.

## 2017-05-05 DIAGNOSIS — I10 Essential (primary) hypertension: Secondary | ICD-10-CM | POA: Diagnosis not present

## 2017-05-05 DIAGNOSIS — Z9181 History of falling: Secondary | ICD-10-CM | POA: Diagnosis not present

## 2017-05-05 DIAGNOSIS — F0390 Unspecified dementia without behavioral disturbance: Secondary | ICD-10-CM | POA: Diagnosis not present

## 2017-05-06 ENCOUNTER — Emergency Department
Admission: EM | Admit: 2017-05-06 | Discharge: 2017-05-06 | Disposition: A | Discharge status: Home / Self Care | Payer: PPO | Attending: Emergency Medicine | Admitting: Emergency Medicine

## 2017-05-06 ENCOUNTER — Emergency Department: Payer: PPO

## 2017-05-06 DIAGNOSIS — S0081XA Abrasion of other part of head, initial encounter: Secondary | ICD-10-CM | POA: Diagnosis not present

## 2017-05-06 DIAGNOSIS — Z9049 Acquired absence of other specified parts of digestive tract: Secondary | ICD-10-CM | POA: Diagnosis not present

## 2017-05-06 DIAGNOSIS — Z87891 Personal history of nicotine dependence: Secondary | ICD-10-CM | POA: Insufficient documentation

## 2017-05-06 DIAGNOSIS — F0280 Dementia in other diseases classified elsewhere without behavioral disturbance: Secondary | ICD-10-CM | POA: Insufficient documentation

## 2017-05-06 DIAGNOSIS — S0990XA Unspecified injury of head, initial encounter: Secondary | ICD-10-CM | POA: Diagnosis not present

## 2017-05-06 DIAGNOSIS — Y92129 Unspecified place in nursing home as the place of occurrence of the external cause: Secondary | ICD-10-CM | POA: Diagnosis not present

## 2017-05-06 DIAGNOSIS — W19XXXA Unspecified fall, initial encounter: Secondary | ICD-10-CM

## 2017-05-06 DIAGNOSIS — Y9389 Activity, other specified: Secondary | ICD-10-CM | POA: Diagnosis not present

## 2017-05-06 DIAGNOSIS — I10 Essential (primary) hypertension: Secondary | ICD-10-CM | POA: Insufficient documentation

## 2017-05-06 DIAGNOSIS — Y998 Other external cause status: Secondary | ICD-10-CM | POA: Insufficient documentation

## 2017-05-06 DIAGNOSIS — W0110XA Fall on same level from slipping, tripping and stumbling with subsequent striking against unspecified object, initial encounter: Secondary | ICD-10-CM | POA: Diagnosis not present

## 2017-05-06 DIAGNOSIS — G309 Alzheimer's disease, unspecified: Secondary | ICD-10-CM | POA: Diagnosis not present

## 2017-05-06 DIAGNOSIS — I4510 Unspecified right bundle-branch block: Secondary | ICD-10-CM | POA: Diagnosis not present

## 2017-05-06 DIAGNOSIS — R51 Headache: Secondary | ICD-10-CM | POA: Diagnosis not present

## 2017-05-06 HISTORY — DX: Dementia in other diseases classified elsewhere, unspecified severity, without behavioral disturbance, psychotic disturbance, mood disturbance, and anxiety: F02.80

## 2017-05-06 HISTORY — DX: Alzheimer's disease with late onset: G30.1

## 2017-05-06 LAB — BASIC METABOLIC PANEL
Anion gap: 7 (ref 5–15)
BUN: 18 mg/dL (ref 6–20)
CHLORIDE: 106 mmol/L (ref 101–111)
CO2: 29 mmol/L (ref 22–32)
CREATININE: 0.61 mg/dL (ref 0.44–1.00)
Calcium: 9 mg/dL (ref 8.9–10.3)
GFR calc non Af Amer: >60 mL/min (ref >60)
Glucose, Bld: 107 mg/dL — ABNORMAL HIGH (ref 65–99)
Potassium: 3.7 mmol/L (ref 3.5–5.1)
SODIUM: 142 mmol/L (ref 135–145)

## 2017-05-06 LAB — CBC
HCT: 38.2 % (ref 35.0–47.0)
Hemoglobin: 13 g/dL (ref 12.0–16.0)
MCH: 31.7 pg (ref 26.0–34.0)
MCHC: 34.1 g/dL (ref 32.0–36.0)
MCV: 93.1 fL (ref 80.0–100.0)
PLATELETS: 136 10*3/uL — AB (ref 150–440)
RBC: 4.11 MIL/uL (ref 3.80–5.20)
RDW: 13.3 % (ref 11.5–14.5)
WBC: 6.9 10*3/uL (ref 3.6–11.0)

## 2017-05-06 NOTE — ED Triage Notes (Signed)
Pt presents to ED via [facility] from [facility] after 2 unwitnessed falls today. Abrasion to forehead present, no bleeding at this time. Bruising yellow in color to left eye. Pt unsure if she hit head.

## 2017-05-06 NOTE — ED Provider Notes (Signed)
[facility] Emergency Department Provider Note       Time seen: ----------------------------------------- 5:57 PM on 05/06/2017 -----------------------------------------  L5 caveat: Review of systems and history is limited by dementia  I have reviewed the triage vital signs and the nursing notes.   HISTORY   Chief Complaint Fall    HPI [name] is a 83 y.o. female who presents to the ED for after 2 unwitnessed falls at her nursing home. She was noted to have abrasion to her forehead but no bleeding. She was recently seen here for similar and discharged to return to the nursing home after negative workup. She denies any complaints at this time.   Past Medical History:  Diagnosis Date  . Alzheimer's dementia, late onset   . Depression   . Dizziness   . Heart murmur   . Hx of migraine headaches   . Hypertension   . RBBB   . Thrombocytopenia ([place])   . Vitamin D deficiency disease     Patient Active Problem List   Diagnosis Date Noted  . Late onset Alzheimer's disease without behavioral disturbance 09/07/2016  . B12 deficiency 02/07/2016  . Major depressive disorder, recurrent, moderate ([place]) 02/05/2016  . Vitamin D deficiency disease   . RBBB   . Thrombocytopenia ([place])   . Heart murmur   . Essential hypertension 09/05/2014    Past Surgical History:  Procedure Laterality Date  . ABDOMINAL HYSTERECTOMY    . APPENDECTOMY    . CATARACT EXTRACTION, BILATERAL    . CHOLECYSTECTOMY      Allergies Patient has no known allergies.  Social History Social History  Substance Use Topics  . Smoking status: Former Smoker    Packs/day: 0.50    Years: 15.00    Types: Cigarettes    Quit date: 12/14/2007  . Smokeless tobacco: Never Used  . Alcohol use 0.0 oz/week     Comment: glass of wine every couple weeks    Review of Systems Unknown, positive for recent falls  All systems negative/normal/unremarkable except as stated in the  HPI  ____________________________________________   PHYSICAL EXAM:  VITAL SIGNS: ED Triage Vitals  Enc Vitals Group     BP 05/06/17 1706 (!) 147/67     Pulse Rate 05/06/17 1706 91     Resp 05/06/17 1706 18     Temp 05/06/17 1706 98.7 F (37.1 C)     Temp Source 05/06/17 1706 Oral     SpO2 05/06/17 1706 99 %     Weight 05/06/17 1702 115 lb (52.2 kg)     Height 05/06/17 1702 5\' 3"  (1.6 m)     Head Circumference --      Peak Flow --      Pain Score --      Pain Loc --      Pain Edu? --      Excl. in [place]? --    Constitutional: Alert But disoriented, Well appearing and in no distress. Eyes: Conjunctivae are normal. Normal extraocular movements. ENT   Head: Normocephalic, numerous areas of ecchymosis in various stages of healing on the face, and forehead. These are particular noted on the left.   Nose: No congestion/rhinnorhea.   Mouth/Throat: Mucous membranes are moist.   Neck: No stridor. Cardiovascular: Normal rate, regular rhythm. No murmurs, rubs, or gallops. Respiratory: Normal respiratory effort without tachypnea nor retractions. Breath sounds are clear and equal bilaterally. No wheezes/rales/rhonchi. Gastrointestinal: Soft and nontender. Normal bowel sounds Musculoskeletal: Nontender with normal range  of motion in extremities. No lower extremity tenderness nor edema. No pain elicited with range of motion of the legs Neurologic:  Normal speech and language. No gross focal neurologic deficits are appreciated.  Skin:  Skin is warm, dry and intact. No rash noted. Psychiatric: Mood and affect are normal. Speech and behavior are normal.  ____________________________________________  EKG: Interpreted by me. Sinus rhythm rate 87 bpm, normal PR interval, wide QRS, long QT, right bundle branch block  ____________________________________________  ED COURSE:  Pertinent labs & imaging results that were available during my care of the patient were reviewed by me and  considered in my medical decision making (see chart for details). Patient presents for a fall, we will assess with labs and imaging as indicated.   Procedures ____________________________________________   LABS (pertinent positives/negatives)  Labs Reviewed  BASIC METABOLIC PANEL - Abnormal; Notable for the following:       Result Value   Glucose, Bld 107 (*)    All other components within normal limits  CBC - Abnormal; Notable for the following:    Platelets 136 (*)    All other components within normal limits  URINALYSIS, COMPLETE (UACMP) WITH MICROSCOPIC    RADIOLOGY Images were viewed by me  CT head IMPRESSION: No evidence of acute intracranial abnormality. ____________________________________________  FINAL ASSESSMENT AND PLAN  Fall, head injury  Plan: Patient's labs and imaging were dictated above. Patient had presented for A mechanical fall of uncertain etiology. Labs workup here been negative. She is stable for outpatient follow-up.   Earleen Newport, MD   Note: This note was generated in part or whole with voice recognition software. Voice recognition is usually quite accurate but there are transcription errors that can and very often do occur. I apologize for any typographical errors that were not detected and corrected.     Earleen Newport, MD 05/06/17 Einar Crow

## 2017-05-10 DIAGNOSIS — I10 Essential (primary) hypertension: Secondary | ICD-10-CM | POA: Diagnosis not present

## 2017-05-10 DIAGNOSIS — Z9181 History of falling: Secondary | ICD-10-CM | POA: Diagnosis not present

## 2017-05-10 DIAGNOSIS — F0390 Unspecified dementia without behavioral disturbance: Secondary | ICD-10-CM | POA: Diagnosis not present

## 2017-05-18 ENCOUNTER — Telehealth: Payer: Self-pay | Admitting: Family Medicine

## 2017-05-18 DIAGNOSIS — Z9181 History of falling: Secondary | ICD-10-CM | POA: Diagnosis not present

## 2017-05-18 DIAGNOSIS — I10 Essential (primary) hypertension: Secondary | ICD-10-CM | POA: Diagnosis not present

## 2017-05-18 DIAGNOSIS — F0390 Unspecified dementia without behavioral disturbance: Secondary | ICD-10-CM | POA: Diagnosis not present

## 2017-05-18 NOTE — Telephone Encounter (Signed)
Called pt to schedule Annual Wellness Visit with [facility]  - knb  °

## 2017-05-19 ENCOUNTER — Telehealth: Payer: Self-pay | Admitting: Family Medicine

## 2017-05-19 NOTE — Telephone Encounter (Signed)
Speech therapist from [facility] needs an order to provide 6 more treatments for cognitive therapy.  Please Advise.  Thank you

## 2017-05-19 NOTE — Telephone Encounter (Signed)
That sounds great. OK to give verbal orders 

## 2017-05-19 NOTE — Telephone Encounter (Signed)
Done

## 2017-05-24 DIAGNOSIS — I10 Essential (primary) hypertension: Secondary | ICD-10-CM | POA: Diagnosis not present

## 2017-05-24 DIAGNOSIS — F0390 Unspecified dementia without behavioral disturbance: Secondary | ICD-10-CM | POA: Diagnosis not present

## 2017-05-24 DIAGNOSIS — Z9181 History of falling: Secondary | ICD-10-CM | POA: Diagnosis not present

## 2017-05-25 ENCOUNTER — Telehealth: Payer: Self-pay | Admitting: Family Medicine

## 2017-05-25 NOTE — Telephone Encounter (Signed)
Called pt to schedule Annual Wellness Visit with [facility]  - knb  °

## 2017-06-04 ENCOUNTER — Emergency Department: Payer: PPO

## 2017-06-04 ENCOUNTER — Observation Stay
Admission: EM | Admit: 2017-06-04 | Discharge: 2017-06-07 | Disposition: A | Discharge status: Home / Self Care | Payer: PPO | Attending: Internal Medicine | Admitting: Internal Medicine

## 2017-06-04 DIAGNOSIS — F03918 Unspecified dementia, unspecified severity, with other behavioral disturbance: Secondary | ICD-10-CM

## 2017-06-04 DIAGNOSIS — D696 Thrombocytopenia, unspecified: Secondary | ICD-10-CM | POA: Insufficient documentation

## 2017-06-04 DIAGNOSIS — R451 Restlessness and agitation: Secondary | ICD-10-CM | POA: Insufficient documentation

## 2017-06-04 DIAGNOSIS — R2681 Unsteadiness on feet: Secondary | ICD-10-CM | POA: Diagnosis not present

## 2017-06-04 DIAGNOSIS — Z79899 Other long term (current) drug therapy: Secondary | ICD-10-CM | POA: Insufficient documentation

## 2017-06-04 DIAGNOSIS — R339 Retention of urine, unspecified: Secondary | ICD-10-CM | POA: Diagnosis not present

## 2017-06-04 DIAGNOSIS — I10 Essential (primary) hypertension: Secondary | ICD-10-CM | POA: Insufficient documentation

## 2017-06-04 DIAGNOSIS — R4182 Altered mental status, unspecified: Secondary | ICD-10-CM | POA: Diagnosis not present

## 2017-06-04 DIAGNOSIS — S0990XA Unspecified injury of head, initial encounter: Secondary | ICD-10-CM | POA: Diagnosis not present

## 2017-06-04 DIAGNOSIS — E559 Vitamin D deficiency, unspecified: Secondary | ICD-10-CM | POA: Diagnosis not present

## 2017-06-04 DIAGNOSIS — F0391 Unspecified dementia with behavioral disturbance: Secondary | ICD-10-CM | POA: Diagnosis not present

## 2017-06-04 DIAGNOSIS — I4510 Unspecified right bundle-branch block: Secondary | ICD-10-CM | POA: Diagnosis not present

## 2017-06-04 DIAGNOSIS — M1990 Unspecified osteoarthritis, unspecified site: Secondary | ICD-10-CM | POA: Insufficient documentation

## 2017-06-04 DIAGNOSIS — R531 Weakness: Principal | ICD-10-CM | POA: Insufficient documentation

## 2017-06-04 DIAGNOSIS — R739 Hyperglycemia, unspecified: Secondary | ICD-10-CM

## 2017-06-04 DIAGNOSIS — R262 Difficulty in walking, not elsewhere classified: Secondary | ICD-10-CM | POA: Diagnosis not present

## 2017-06-04 DIAGNOSIS — S299XXA Unspecified injury of thorax, initial encounter: Secondary | ICD-10-CM | POA: Diagnosis not present

## 2017-06-04 DIAGNOSIS — W19XXXA Unspecified fall, initial encounter: Secondary | ICD-10-CM | POA: Insufficient documentation

## 2017-06-04 DIAGNOSIS — R270 Ataxia, unspecified: Secondary | ICD-10-CM

## 2017-06-04 DIAGNOSIS — F0390 Unspecified dementia without behavioral disturbance: Secondary | ICD-10-CM | POA: Diagnosis not present

## 2017-06-04 DIAGNOSIS — R079 Chest pain, unspecified: Secondary | ICD-10-CM | POA: Diagnosis not present

## 2017-06-04 LAB — URINALYSIS, COMPLETE (UACMP) WITH MICROSCOPIC
BILIRUBIN URINE: NEGATIVE
Glucose, UA: NEGATIVE mg/dL
Hgb urine dipstick: NEGATIVE
Ketones, ur: NEGATIVE mg/dL
LEUKOCYTES UA: NEGATIVE
Nitrite: NEGATIVE
Protein, ur: 30 mg/dL — AB
RBC / HPF: NONE SEEN RBC/hpf (ref 0–5)
SPECIFIC GRAVITY, URINE: 1.012 (ref 1.005–1.030)
pH: 7 (ref 5.0–8.0)

## 2017-06-04 LAB — COMPREHENSIVE METABOLIC PANEL
ALBUMIN: 4.3 g/dL (ref 3.5–5.0)
ALK PHOS: 107 U/L (ref 38–126)
ALT: 22 U/L (ref 14–54)
ANION GAP: 10 (ref 5–15)
AST: 32 U/L (ref 15–41)
BUN: 14 mg/dL (ref 6–20)
CALCIUM: 9.5 mg/dL (ref 8.9–10.3)
CO2: 27 mmol/L (ref 22–32)
Chloride: 103 mmol/L (ref 101–111)
Creatinine, Ser: 0.44 mg/dL (ref 0.44–1.00)
GFR calc Af Amer: >60 mL/min (ref >60)
GFR calc non Af Amer: >60 mL/min (ref >60)
GLUCOSE: 101 mg/dL — AB (ref 65–99)
Potassium: 3.6 mmol/L (ref 3.5–5.1)
SODIUM: 140 mmol/L (ref 135–145)
Total Bilirubin: 1 mg/dL (ref 0.3–1.2)
Total Protein: 7 g/dL (ref 6.5–8.1)

## 2017-06-04 LAB — CBC WITH DIFFERENTIAL/PLATELET
BASOS PCT: 0 %
Basophils Absolute: 0 10*3/uL (ref 0–0.1)
Eosinophils Absolute: 0 10*3/uL (ref 0–0.7)
Eosinophils Relative: 0 %
HEMATOCRIT: 40.7 % (ref 35.0–47.0)
Hemoglobin: 14.1 g/dL (ref 12.0–16.0)
LYMPHS ABS: 0.8 10*3/uL — AB (ref 1.0–3.6)
LYMPHS PCT: 10 %
MCH: 31.7 pg (ref 26.0–34.0)
MCHC: 34.7 g/dL (ref 32.0–36.0)
MCV: 91.3 fL (ref 80.0–100.0)
MONO ABS: 1 10*3/uL — AB (ref 0.2–0.9)
MONOS PCT: 12 %
NEUTROS ABS: 6.5 10*3/uL (ref 1.4–6.5)
Neutrophils Relative %: 78 %
Platelets: 125 10*3/uL — ABNORMAL LOW (ref 150–440)
RBC: 4.46 MIL/uL (ref 3.80–5.20)
RDW: 13.3 % (ref 11.5–14.5)
WBC: 8.3 10*3/uL (ref 3.6–11.0)

## 2017-06-04 LAB — TROPONIN I: Troponin I: <0.03 ng/mL (ref <0.03)

## 2017-06-04 MED ORDER — ONDANSETRON HCL 4 MG PO TABS: 4.0000 mg | ORAL_TABLET | Freq: Four times a day (QID) | ORAL | Status: DC | PRN

## 2017-06-04 MED ORDER — ONDANSETRON HCL 4 MG/2ML IJ SOLN: 4.0000 mg | Freq: Four times a day (QID) | INTRAMUSCULAR | Status: DC | PRN

## 2017-06-04 MED ORDER — TRAMADOL HCL 50 MG PO TABS: 50.0000 mg | ORAL_TABLET | Freq: Four times a day (QID) | ORAL | Status: DC | PRN

## 2017-06-04 MED ORDER — BUPROPION HCL ER (XL) 150 MG PO TB24
300.0000 mg | ORAL_TABLET | Freq: Every day | ORAL | Status: DC
  Administered 2017-06-05 – 2017-06-07 (×3): 300 mg via ORAL
  Filled 2017-06-04 (×3): qty 2

## 2017-06-04 MED ORDER — SODIUM CHLORIDE 0.9 % IV SOLN
Freq: Once | INTRAVENOUS | Status: AC
  Administered 2017-06-04: 20:00:00 via INTRAVENOUS

## 2017-06-04 MED ORDER — QUETIAPINE FUMARATE 25 MG PO TABS
25.0000 mg | ORAL_TABLET | Freq: Every day | ORAL | Status: DC
  Administered 2017-06-04 – 2017-06-06 (×3): 25 mg via ORAL
  Filled 2017-06-04 (×3): qty 1

## 2017-06-04 MED ORDER — DIVALPROEX SODIUM 250 MG PO DR TAB
250.0000 mg | DELAYED_RELEASE_TABLET | Freq: Two times a day (BID) | ORAL | Status: DC
  Administered 2017-06-04 – 2017-06-07 (×6): 250 mg via ORAL
  Filled 2017-06-04 (×7): qty 1

## 2017-06-04 MED ORDER — ACETAMINOPHEN 325 MG PO TABS: 650.0000 mg | ORAL_TABLET | Freq: Four times a day (QID) | ORAL | Status: DC | PRN

## 2017-06-04 MED ORDER — SENNOSIDES-DOCUSATE SODIUM 8.6-50 MG PO TABS: 1.0000 | ORAL_TABLET | Freq: Every evening | ORAL | Status: DC | PRN

## 2017-06-04 MED ORDER — SODIUM CHLORIDE 0.9 % IV BOLUS (SEPSIS)
500.0000 mL | Freq: Once | INTRAVENOUS | Status: AC
  Administered 2017-06-04: 500 mL via INTRAVENOUS

## 2017-06-04 MED ORDER — DONEPEZIL HCL 5 MG PO TABS
10.0000 mg | ORAL_TABLET | Freq: Every day | ORAL | Status: DC
  Administered 2017-06-04 – 2017-06-06 (×3): 10 mg via ORAL
  Filled 2017-06-04: qty 1
  Filled 2017-06-04: qty 2
  Filled 2017-06-04: qty 1
  Filled 2017-06-04: qty 2

## 2017-06-04 NOTE — ED Notes (Signed)
Attempt to ambulate pt unsuccessful, pt too weak to hold self up on the side of bed.

## 2017-06-04 NOTE — ED Notes (Signed)
Patient transported to CT and X-Ray 

## 2017-06-04 NOTE — ED Notes (Signed)
Assisted pt with bedpan

## 2017-06-04 NOTE — H&P (Signed)
[place] at [place] NAME: [name]    MR#:  [id number]  DATE OF BIRTH:  [birth year]  DATE OF ADMISSION:  06/04/2017  PRIMARY CARE PHYSICIAN: Valerie Roys, DO   REQUESTING/REFERRING PHYSICIAN: Dr. Burlene Arnt  CHIEF COMPLAINT:  Generalized weakness difficulty ambulating and frequent falls  HISTORY OF PRESENT ILLNESS:  [name]  is a 83 y.o. female with a known history ofAlzheimer's dementia, chronic thrombocytopenia, depression comes to the emergency room from [facility] assisted living accompanied by daughter who gives most history. Patient has dementia and unable to give any history in detail or review system. She just complains of back pain. Staff at [facility] noticed patient has been weak and unsteady on her gait and has had a couple falls last few days. No vomiting fever diarrhea shortness of breath or chest pain noted by staff at the facility. Patient had difficulty ambulating secondary to generalized weakness she is being admitted for further evaluation and management. No source of infection identified her labs look stable.  PAST MEDICAL HISTORY:   Past Medical History:  Diagnosis Date  . Alzheimer's dementia, late onset   . Depression   . Dizziness   . Heart murmur   . Hx of migraine headaches   . Hypertension   . RBBB   . Thrombocytopenia ([place])   . Vitamin D deficiency disease     PAST SURGICAL HISTOIRY:   Past Surgical History:  Procedure Laterality Date  . ABDOMINAL HYSTERECTOMY    . APPENDECTOMY    . CATARACT EXTRACTION, BILATERAL    . CHOLECYSTECTOMY      SOCIAL HISTORY:   Social History  Substance Use Topics  . Smoking status: Former Smoker    Packs/day: 0.50    Years: 15.00    Types: Cigarettes    Quit date: 12/14/2007  . Smokeless tobacco: Never Used  . Alcohol use 0.0 oz/week     Comment: glass of wine every couple weeks    FAMILY HISTORY:   Family History  Problem Relation Age of  Onset  . Arthritis Mother   . Arthritis Father   . Atrial fibrillation Sister     DRUG ALLERGIES:  No Known Allergies  REVIEW OF SYSTEMS:  Review of Systems  Unable to perform ROS: Dementia     MEDICATIONS AT HOME:   Prior to Admission medications   Medication Sig Start Date End Date Taking? Authorizing Provider  acetaminophen (TYLENOL) 325 MG tablet Take 650 mg by mouth every 6 (six) hours as needed.   Yes [provider]  buPROPion (WELLBUTRIN XL) 300 MG 24 hr tablet Take 1 tablet (300 mg total) by mouth daily. 02/17/17  Yes Johnson, Megan P, DO  divalproex (DEPAKOTE) 250 MG DR tablet Take 1 tablet (250 mg total) by mouth 2 (two) times daily. 03/07/17  Yes Tat, Eustace Quail, DO  donepezil (ARICEPT) 10 MG tablet Take 1 tablet (10 mg total) by mouth at bedtime. 11/29/16  Yes Tat, Eustace Quail, DO  QUEtiapine (SEROQUEL) 25 MG tablet 1/2-1 tab BID PRN anxiety and 1 tab qHS Patient taking differently: Take 25 mg by mouth at bedtime.  04/28/17  Yes Johnson, Megan P, DO      VITAL SIGNS:  Blood pressure (!) 165/97, pulse 96, temperature 98.3 F (36.8 C), temperature source Oral, resp. rate (!) 23, height 5\' 3"  (1.6 m), weight 52.2 kg (115 lb), SpO2 98 %.  PHYSICAL EXAMINATION:  GENERAL:  82 y.o.-year-old patient  lying in the bed with no acute distress. thin EYES: Pupils equal, round, reactive to light and accommodation. No scleral icterus. Extraocular muscles intact.  HEENT: Head atraumatic, normocephalic. Oropharynx and nasopharynx clear. Oral mucosa dry. NECK:  Supple, no jugular venous distention. No thyroid enlargement, no tenderness.  LUNGS: Normal breath sounds bilaterally, no wheezing, rales,rhonchi or crepitation. No use of accessory muscles of respiration.  CARDIOVASCULAR: S1, S2 normal. No murmurs, rubs, or gallops.  ABDOMEN: Soft, nontender, nondistended. Bowel sounds present. No organomegaly or mass.  EXTREMITIES: No pedal edema, cyanosis, or clubbing.  NEUROLOGIC:  Grossly nonfocal. Patient unable to participate secondary to dementia. She moves all extremities well. PSYCHIATRIC: The patient is alert and baseline dementia SKIN: No obvious rash, lesion, or ulcer.   LABORATORY PANEL:   CBC  Recent Labs Lab 06/04/17 1317  WBC 8.3  HGB 14.1  HCT 40.7  PLT 125*   ------------------------------------------------------------------------------------------------------------------  Chemistries   Recent Labs Lab 06/04/17 1317  NA 140  K 3.6  CL 103  CO2 27  GLUCOSE 101*  BUN 14  CREATININE 0.44  CALCIUM 9.5  AST 32  ALT 22  ALKPHOS 107  BILITOT 1.0   ------------------------------------------------------------------------------------------------------------------  Cardiac Enzymes  Recent Labs Lab 06/04/17 1317  TROPONINI <0.03   ------------------------------------------------------------------------------------------------------------------  RADIOLOGY:  Dg Chest 2 View  Result Date: 06/04/2017 CLINICAL DATA:  Pain after fall. EXAM: CHEST  2 VIEW COMPARISON:  March 06, 2012 FINDINGS: The heart size and mediastinal contours are within normal limits. Both lungs are clear. The visualized skeletal structures are unremarkable. IMPRESSION: No active cardiopulmonary disease. Electronically Signed   By: Dorise Bullion III M.D   On: 06/04/2017 13:48   Ct Head Wo Contrast  Result Date: 06/04/2017 CLINICAL DATA:  83 year old female with a history of fall EXAM: CT HEAD WITHOUT CONTRAST TECHNIQUE: Contiguous axial images were obtained from the base of the skull through the vertex without intravenous contrast. COMPARISON:  05/06/2017 FINDINGS: Brain: No acute intracranial hemorrhage. No midline shift or mass effect. Gray-white differentiation maintained. Mild volume loss. Patchy hypodensity in the periventricular white matter. Vascular: Calcifications of the anterior circulation. Skull: No displaced fracture.  No aggressive bone lesion.  Sinuses/Orbits: Unremarkable appearance of the orbits. Unremarkable paranasal sinuses. Other: None IMPRESSION: No CT evidence of acute intracranial abnormality. Chronic microvascular ischemic disease and associated intracranial atherosclerosis Electronically Signed   By: Corrie Mckusick D.O.   On: 06/04/2017 13:38    EKG:   Sinus rhythm. Nonspecific ST-T changes. IMPRESSION AND PLAN:   [name]  is a 83 y.o. female with a known history ofAlzheimer's dementia, chronic thrombocytopenia, depression comes to the emergency room from [facility] assisted living accompanied by daughter who gives most history. Patient has dementia and unable to give any history in detail or review system. She just complains of back pain. Staff at [facility] noticed patient has been weak and unsteady on her gait and has had a couple falls last few days  1. Generalized weakness, unsteady gait and frequent falls -Patient under observation -IV fluids -Physical therapy -Social worker for discharge planning  2. Alzheimer's dementia -Continue Aricept  3. Degenerative arthritis when necessary pain meds  4. DVT prophylaxis SCD and TED hose patient has thrombocytopenia will avoid antiplatelet agents   All the records are reviewed and case discussed with ED provider. Management plans discussed with the patient, family and they are in agreement.  CODE STATUS: DNR----discussed with patient's daughter in the room  TOTAL TIME TAKING CARE OF THIS  PATIENT: *40* minutes.    Tyechia Allmendinger M.D on 06/04/2017 at 4:36 PM  Between 7am to 6pm - Pager - [phone]  After 6pm go to [email or web address] - password [email or web address] [place] [facility]  Office  [phone]  CC: Primary care physician; Valerie Roys, DO

## 2017-06-04 NOTE — ED Notes (Signed)
Pt assisted with bed pan

## 2017-06-04 NOTE — Progress Notes (Signed)
Pt admitted to 1-A ortho RM 151. Daughter at bedside.

## 2017-06-04 NOTE — ED Triage Notes (Signed)
Pt to ED via [facility] from [facility] c/o fall. Per EMS pt suddenly lost gait and fell while walking down hall. EMS also reports pt unable to walk at scene. Pt has hx of dementia and not complaining of pain at this time, but does c/o weakness.

## 2017-06-04 NOTE — ED Provider Notes (Signed)
[facility] Emergency Department Provider Note  ____________________________________________   I have reviewed the triage vital signs and the nursing notes.   HISTORY  Chief Complaint Fall    HPI [name] is a 83 y.o. female With a history of dementia, who lives skilled nursing facility, patient cannot give a good history. AmLevel 5 chart caveat; no further history available due to patient status.. According to her daughter, she walks with a cane and a walker, and when she gets dehydrated or has any other issues she gets unsteady on her feet. Pt tripped and fell today and was found to be unsteady on her feet. She has no other complaints. She denies pain or injury. Daughter, at bedside feels that pt is near her baseline mentally.       Past Medical History:  Diagnosis Date  . Alzheimer's dementia, late onset   . Depression   . Dizziness   . Heart murmur   . Hx of migraine headaches   . Hypertension   . RBBB   . Thrombocytopenia ([place])   . Vitamin D deficiency disease     Patient Active Problem List   Diagnosis Date Noted  . Late onset Alzheimer's disease without behavioral disturbance 09/07/2016  . B12 deficiency 02/07/2016  . Major depressive disorder, recurrent, moderate ([place]) 02/05/2016  . Vitamin D deficiency disease   . RBBB   . Thrombocytopenia ([place])   . Heart murmur   . Essential hypertension 09/05/2014    Past Surgical History:  Procedure Laterality Date  . ABDOMINAL HYSTERECTOMY    . APPENDECTOMY    . CATARACT EXTRACTION, BILATERAL    . CHOLECYSTECTOMY      Prior to Admission medications   Medication Sig Start Date End Date Taking? Authorizing Provider  buPROPion (WELLBUTRIN XL) 300 MG 24 hr tablet Take 1 tablet (300 mg total) by mouth daily. 02/17/17   Johnson, Megan P, DO  divalproex (DEPAKOTE) 250 MG DR tablet Take 1 tablet (250 mg total) by mouth 2 (two) times daily. 03/07/17   Tat, Eustace Quail, DO   donepezil (ARICEPT) 10 MG tablet Take 1 tablet (10 mg total) by mouth at bedtime. 11/29/16   Tat, Eustace Quail, DO  QUEtiapine (SEROQUEL) 25 MG tablet 1/2-1 tab BID PRN anxiety and 1 tab qHS Patient taking differently: Take 25 mg by mouth at bedtime. 1/2-1 tab BID PRN anxiety and 1 tab qHS 04/28/17   Johnson, Megan P, DO    Allergies Patient has no known allergies.  Family History  Problem Relation Age of Onset  . Arthritis Mother   . Arthritis Father   . Atrial fibrillation Sister     Social History Social History  Substance Use Topics  . Smoking status: Former Smoker    Packs/day: 0.50    Years: 15.00    Types: Cigarettes    Quit date: 12/14/2007  . Smokeless tobacco: Never Used  . Alcohol use 0.0 oz/week     Comment: glass of wine every couple weeks    Review of Systems. Limited by pt dementia, but these are her answers.  Constitutional: No fever/chills Eyes: No visual changes. ENT: No sore throat. No stiff neck no neck pain Cardiovascular: Denies chest pain. Respiratory: Denies shortness of breath. Gastrointestinal:   no vomiting.  No diarrhea.  No constipation. Genitourinary: Negative for dysuria. Musculoskeletal: Negative lower extremity swelling Skin: Negative for rash. Neurological: Negative for severe headaches, focal weakness or numbness.   ____________________________________________   PHYSICAL  EXAM:  VITAL SIGNS: ED Triage Vitals  Enc Vitals Group     BP 06/04/17 1306 (!) 147/83     Pulse Rate 06/04/17 1306 80     Resp 06/04/17 1306 18     Temp 06/04/17 1306 98.3 F (36.8 C)     Temp Source 06/04/17 1306 Oral     SpO2 06/04/17 1306 100 %     Weight 06/04/17 1307 115 lb (52.2 kg)     Height 06/04/17 1307 5\' 3"  (1.6 m)     Head Circumference --      Peak Flow --      Pain Score --      Pain Loc --      Pain Edu? --      Excl. in [place]? --     Constitutional: Alert and oriented to name and daughter unsure of date or location. Well appearing and in  no acute distress. Eyes: Conjunctivae are normal Head: Atraumatic HEENT: No congestion/rhinnorhea. Mucous membranes are moist.  Oropharynx non-erythematous Neck:   Nontender with no meningismus, no masses, no stridor Cardiovascular: Normal rate, regular rhythm. Grossly normal heart sounds.  Good peripheral circulation. Respiratory: Normal respiratory effort.  No retractions. Lungs CTAB. Abdominal: Soft and nontender. No distention. No guarding no rebound Back:  There is no focal tenderness or step off.  there is no midline tenderness there are no lesions noted. there is no CVA tenderness Musculoskeletal: No lower extremity tenderness, no upper extremity tenderness. No joint effusions, no DVT signs strong distal pulses no edema Neurologic:  Cranial nerves II through XII are grossly intact 5 out of 5 strength bilateral upper and lower extremity. Finger to nose within normal limits heel to shin within normal limits, speech is normal with no word finding difficulty or dysarthria, reflexes symmetric, pupils are equally round and reactive to light, there is no pronator drift, sensation is normal, vision is intact to confrontation, gait is deferred, there is no nystagmus, normal neurologic exam Skin:  Skin is warm, dry and intact. No rash noted. Psychiatric: Mood and affect are normal. Speech and behavior are normal.  ____________________________________________   LABS (all labs ordered are listed, but only abnormal results are displayed)  Labs Reviewed  URINALYSIS, COMPLETE (UACMP) WITH MICROSCOPIC  COMPREHENSIVE METABOLIC PANEL  CBC WITH DIFFERENTIAL/PLATELET  TROPONIN I   ____________________________________________  EKG  I personally interpreted any EKGs ordered by me or triage Sinus rhythm, right bundle branch block left anterior fascicular block, all of these changes seem to be altered and there is no acute evidence of acute  infarct. ____________________________________________  RADIOLOGY  I reviewed any imaging ordered by me or triage that were performed during my shift and, if possible, patient and/or family made aware of any abnormal findings. ____________________________________________   PROCEDURES  Procedure(s) performed: None  Procedures  Critical Care performed: None  ____________________________________________   INITIAL IMPRESSION / ASSESSMENT AND PLAN / ED COURSE  Pertinent labs & imaging results that were available during my care of the patient were reviewed by me and considered in my medical decision making (see chart for details).  Patient had what sounds non-syncopal fall today and generalized weakness unclear etiology this apparently happens with some frequency for her. As it was an unwitnessed fall we'll obtain a CT scan of her head in for her weakness we'll check urine and basic blood work give her IV fluids which her daughter thinks will make her feel better and we will reassess.  ----------------------------------------- 3:38 PM on  06/04/2017 -----------------------------------------  Patient is in no acute distress however she is too unstable for [place] to get her to family. Always a chance that there is an occult CVA she also has a history of "in her ear, she likely will need to be admitted for further evaluation we will discuss with the hospitalist service and no indication for TPA at this time.    ____________________________________________   FINAL CLINICAL IMPRESSION(S) / ED DIAGNOSES  Final diagnoses:  None      This chart was dictated using voice recognition software.  Despite best efforts to proofread,  errors can occur which can change meaning.      Schuyler Amor, MD 06/04/17 1538

## 2017-06-05 ENCOUNTER — Observation Stay: Payer: PPO

## 2017-06-05 DIAGNOSIS — D696 Thrombocytopenia, unspecified: Secondary | ICD-10-CM | POA: Diagnosis not present

## 2017-06-05 DIAGNOSIS — R270 Ataxia, unspecified: Secondary | ICD-10-CM | POA: Diagnosis not present

## 2017-06-05 DIAGNOSIS — R531 Weakness: Secondary | ICD-10-CM | POA: Diagnosis not present

## 2017-06-05 DIAGNOSIS — R4182 Altered mental status, unspecified: Secondary | ICD-10-CM | POA: Diagnosis not present

## 2017-06-05 DIAGNOSIS — R739 Hyperglycemia, unspecified: Secondary | ICD-10-CM | POA: Diagnosis not present

## 2017-06-05 LAB — VITAMIN B12: Vitamin B-12: 556 pg/mL (ref 180–914)

## 2017-06-05 LAB — PROTIME-INR
INR: 1.08
Prothrombin Time: 14 seconds (ref 11.4–15.2)

## 2017-06-05 LAB — APTT: APTT: 29 s (ref 24–36)

## 2017-06-05 LAB — MRSA PCR SCREENING: MRSA by PCR: NEGATIVE

## 2017-06-05 LAB — VALPROIC ACID LEVEL: VALPROIC ACID LVL: 51 ug/mL (ref 50.0–100.0)

## 2017-06-05 LAB — TSH: TSH: 1.07 u[IU]/mL (ref 0.350–4.500)

## 2017-06-05 NOTE — Progress Notes (Signed)
1:1 sitter D/C'd due to staffing issues. Tele sitter initiated. Family at bedside. Patient resting comfortably, no immediate distress at this time. Will re-evaluate need for sitter at shift change due to staffing shortage. Nursing supervisor aware, will make arrangements for 7p-7a as available.

## 2017-06-05 NOTE — Progress Notes (Signed)
Spoke with Wells Guiles in lab. Urine sample collected/sent, will send out for culture today.

## 2017-06-05 NOTE — Clinical Social Work Note (Signed)
Clinical Social Work Assessment  Patient Details  Name: [name] MRN: [id number] Date of Birth: [birth year]  Date of referral:  06/05/17               Reason for consult:  Facility Placement                Permission sought to share information with:  Facility Art therapist granted to share information::  Yes, Verbal Permission Granted  Name::        Agency::     Relationship::     Contact Information:     Housing/Transportation Living arrangements for the past 2 months:  [place] of Information:  Facility Patient Interpreter Needed:  None Criminal Activity/Legal Involvement Pertinent to Current Situation/Hospitalization:  No - Comment as needed Significant Relationships:  Adult Children Lives with:  Facility Resident Do you feel safe going back to the place where you live?  Yes Need for family participation in patient care:  Yes (Comment) (Patient has dementia)  Care giving concerns:  Patient admitted from ALF ([facility])/PT  Recommendation for STR   Social Worker assessment / plan:  The CSW spoke with the patient's daughter, [name], due to the patient having dementia/unable to give ROS. [name] gave verbal permission to conduct a bed search, and she named [facility] as preferences. The CSW explained the referral process and insurance authorization process. The patient is under observation status; however, she has [employer]. CSW will gain authorization once a bed offer has been received and chosen. CSW also contacted [facility] who confirmed that the patient can return when stable.  Employment status:  Retired Nurse, adult PT Recommendations:  [place] / Referral to community resources:     Patient/Family's Response to care:  The family thanked the CSW for assistance.  Patient/Family's Understanding of and Emotional Response to Diagnosis, Current  Treatment, and Prognosis:  The patient's family understands her need for escalated level of care at this time.   Emotional Assessment Appearance:  Appears stated age Attitude/Demeanor/Rapport:   (AMS) Affect (typically observed):   (AMS) Orientation:  Oriented to Self, Oriented to Situation Alcohol / Substance use:  Never Used Psych involvement (Current and /or in the community):  No (Comment)  Discharge Needs  Concerns to be addressed:  Care Coordination, Discharge Planning Concerns Readmission within the last 30 days:  No Current discharge risk:    Barriers to Discharge:  Continued Medical Work up   [employer], LCSW 06/05/2017, 1:14 PM

## 2017-06-05 NOTE — Care Management Obs Status (Signed)
[facility] NOTIFICATION   Patient Details  Name: TOSHUA HONSINGER MRN: [id number] Date of Birth: 1934-05-04   Medicare Observation Status Notification Given:  Yes [place] letter)    Mardene Speak, RN 06/05/2017, 12:36 PM

## 2017-06-05 NOTE — Progress Notes (Signed)
Physical Therapy Evaluation Patient Details Name: [name] MRN: [id number] DOB: Mar 12, 1934 Today's Date: 06/05/2017   History of Present Illness  Patient is an 83 y.o. female admitted on 23 JUN from [facility] for increasing weakness/unsteadiness in past few days. Reportedly had a few falls without injury and back pain. PMH includes Alzheimer's dementia, chronic thrombocytopenia, and depression.  Clinical Impression  Patient is a pleasant female admitted for above listed reasons, oriented x1 at time of evaluation with daughter and NA at bedside. Patient demonstrates need for minimal assistance with supine to sit transfer and moderate assistance with sit to supine transfer. Patient demonstrates posterior lean with static sitting and standing, limiting gait assessment to 2' next to bedside. Patient previously modified independent with household distances with RW/SPC but has had an increasing amount of falls in recent history. Because patient is demonstrating generalized weakness and functional mobility deficits, it is believed that she will benefit from skilled and progressive PT both in hospital and upon d/c when medically ready to return to PLOF.    Follow Up Recommendations SNF    Equipment Recommendations  None recommended by PT    Recommendations for Other Services       Precautions / Restrictions Precautions Precautions: Fall Restrictions Weight Bearing Restrictions: No      Mobility  Bed Mobility Overal bed mobility: Needs Assistance Bed Mobility: Supine to Sit;Sit to Supine     Supine to sit: Min assist Sit to supine: Mod assist   General bed mobility comments: Patient requires minimal assistance to move from supine to sit. Moderate assistance required for slowly lying backwards onto bed and bridging to scoot to [facility]. +2 to slide remaining distance to [facility]  Transfers Overall transfer level: Needs assistance Equipment used: Rolling walker (2 wheeled) Transfers: Sit  to/from Stand Sit to Stand: Min assist         General transfer comment: Patient moves from sit to stand and stand to sit with minimal assistance. Demonstrates posterior lean, requiring cues to stand upright. Keeps feet in NBOS and has diffculty lifting feet.  Ambulation/Gait   Ambulation Distance (Feet): 2 Feet Assistive device: Rolling walker (2 wheeled)       General Gait Details: Patient able to ambulate 2' forward and backward upon third attempt standing, demonstrating mild unsteadiness and posterior lean.   Stairs            Wheelchair Mobility    Modified Rankin (Stroke Patients Only)       Balance Overall balance assessment: Needs assistance;History of Falls Sitting-balance support: Feet unsupported;Bilateral upper extremity supported Sitting balance-Leahy Scale: Fair Sitting balance - Comments: Posterior lean   Standing balance support: Bilateral upper extremity supported Standing balance-Leahy Scale: Fair Standing balance comment: Posterior lean                             Pertinent Vitals/Pain Pain Assessment: Faces Faces Pain Scale: Hurts little more Pain Location: Back Pain Descriptors / Indicators: Aching Pain Intervention(s): Limited activity within patient's tolerance;Monitored during session;Repositioned    Home Living Family/patient expects to be discharged to:: Assisted living               Home Equipment: Walker - 2 wheels;Cane - single point      Prior Function Level of Independence: Needs assistance   Gait / Transfers Assistance Needed: Modified independent for household distances with RW  ADL's / Homemaking Assistance Needed: Performed by Kandis Mannan; able  to sit and bath independently with set up        Hand Dominance   Dominant Hand: Right    Extremity/Trunk Assessment   Upper Extremity Assessment Upper Extremity Assessment: Generalized weakness    Lower Extremity Assessment Lower Extremity  Assessment: Generalized weakness    Cervical / Trunk Assessment Cervical / Trunk Assessment: Kyphotic  Communication   Communication: No difficulties  Cognition Arousal/Alertness: Awake/alert Behavior During Therapy: WFL for tasks assessed/performed Overall Cognitive Status: History of cognitive impairments - at baseline                                 General Comments: Patient oriented x1      General Comments      Exercises Other Exercises Other Exercises: Sit to stand and lateral weightshifting performed x3   Assessment/Plan    PT Assessment Patient needs continued PT services  PT Problem List Decreased strength;Decreased activity tolerance;Decreased balance;Decreased mobility;Decreased cognition;Decreased knowledge of use of DME;Decreased safety awareness;Pain       PT Treatment Interventions Gait training;Functional mobility training;Therapeutic activities;Therapeutic exercise;Balance training;Cognitive remediation;Patient/family education    PT Goals (Current goals can be found in the Care Plan section)  Acute Rehab PT Goals Patient Stated Goal: "To get walking again" PT Goal Formulation: With patient/family Time For Goal Achievement: 06/19/17 Potential to Achieve Goals: Good    Frequency Min 2X/week   Barriers to discharge Inaccessible home environment;Decreased caregiver support      Co-evaluation               AM-PAC PT "6 Clicks" Daily Activity  Outcome Measure Difficulty turning over in bed (including adjusting bedclothes, sheets and blankets)?: A Little Difficulty moving from lying on back to sitting on the side of the bed? : A Little Difficulty sitting down on and standing up from a chair with arms (e.g., wheelchair, bedside commode, etc,.)?: A Little Help needed moving to and from a bed to chair (including a wheelchair)?: A Lot Help needed walking in hospital room?: A Lot Help needed climbing 3-5 steps with a railing? : Total 6  Click Score: 14    End of Session Equipment Utilized During Treatment: Gait belt Activity Tolerance: Patient tolerated treatment well Patient left: in bed;with call bell/phone within reach;with bed alarm set;with nursing/sitter in room;with family/visitor present   PT Visit Diagnosis: Unsteadiness on feet (R26.81);Repeated falls (R29.6);Muscle weakness (generalized) (M62.81);Pain;Difficulty in walking, not elsewhere classified (R26.2)    Time: 0867-6195 PT Time Calculation (min) (ACUTE ONLY): 22 min   Charges:   PT Evaluation $PT Eval Low Complexity: 1 Procedure PT Treatments $Therapeutic Activity: 8-22 mins   PT G Codes:   PT G-Codes **NOT FOR INPATIENT CLASS** Functional Assessment Tool Used: AM-PAC 6 Clicks Basic Mobility;Clinical judgement Functional Limitation: Mobility: Walking and moving around Mobility: Walking and Moving Around Current Status (K9326): At least 40 percent but less than 60 percent impaired, limited or restricted Mobility: Walking and Moving Around Goal Status [id number]): At least 20 percent but less than 40 percent impaired, limited or restricted      Dorice Lamas, PT, DPT 06/05/2017, 10:50 AM

## 2017-06-05 NOTE — Progress Notes (Addendum)
[place] [facility] [place] NAME: [name]    MR#:  [id number]  DATE OF BIRTH:  January 03, 1934  SUBJECTIVE:  CHIEF COMPLAINT:   Chief Complaint  Patient presents with  . Fall  The patient is 83 year old Caucasian female with past medical history significant for history of dementia, chronic thrombocytopenia, depression, who presents to the hospital with complaints of back pain, weakness, and sadness of the gait for the past few days. She was brought and admitted to the hospital for generalized weakness. She was evaluated by physical therapist and felt to be skilled nursing facility appropriate. The patient has just babbling at present and not able to provide review of systems  Review of Systems  Unable to perform ROS: Dementia    VITAL SIGNS: Blood pressure (!) 155/73, pulse 88, temperature 99 F (37.2 C), temperature source Oral, resp. rate (!) 22, height 5\' 3"  (1.6 m), weight 52.2 kg (115 lb), SpO2 99 %.  PHYSICAL EXAMINATION:   GENERAL:  83 y.o.-year-old patient lying in the bed with no acute distress, she is verbalizing, very difficult to understand, mumbles and babbles under  her breath. Denies any pain at present EYES: Pupils equal, round, reactive to light and accommodation. No scleral icterus. Extraocular muscles intact.  HEENT: Head atraumatic, normocephalic. Oropharynx and nasopharynx clear.  NECK:  Supple, no jugular venous distention. No thyroid enlargement, no tenderness.  LUNGS: Normal breath sounds bilaterally, no wheezing, rales,rhonchi or crepitation. No use of accessory muscles of respiration.  CARDIOVASCULAR: S1, S2 normal. No murmurs, rubs, or gallops.  ABDOMEN: Soft, nontender, nondistended. Bowel sounds present. No organomegaly or mass.  EXTREMITIES: No pedal edema, cyanosis, or clubbing.  NEUROLOGIC: Cranial nerves II through XII are grossly intact. Muscle strength 4/5 in all extremities. Sensation unable to assess.  Gait not unable to evaluate.  PSYCHIATRIC: The patient is alert , not able to assess orientation.  SKIN: No obvious rash, lesion, or ulcer.   ORDERS/RESULTS REVIEWED:   CBC  Recent Labs Lab 06/04/17 1317  WBC 8.3  HGB 14.1  HCT 40.7  PLT 125*  MCV 91.3  MCH 31.7  MCHC 34.7  RDW 13.3  LYMPHSABS 0.8*  MONOABS 1.0*  EOSABS 0.0  BASOSABS 0.0   ------------------------------------------------------------------------------------------------------------------  Chemistries   Recent Labs Lab 06/04/17 1317  NA 140  K 3.6  CL 103  CO2 27  GLUCOSE 101*  BUN 14  CREATININE 0.44  CALCIUM 9.5  AST 32  ALT 22  ALKPHOS 107  BILITOT 1.0   ------------------------------------------------------------------------------------------------------------------ estimated creatinine clearance is 45.4 mL/min (by C-G formula based on SCr of 0.44 mg/dL). ------------------------------------------------------------------------------------------------------------------ No results for input(s): TSH, T4TOTAL, T3FREE, THYROIDAB in the last 72 hours.  Invalid input(s): FREET3  Cardiac Enzymes  Recent Labs Lab 06/04/17 1317  TROPONINI <0.03   ------------------------------------------------------------------------------------------------------------------ Invalid input(s): POCBNP ---------------------------------------------------------------------------------------------------------------  RADIOLOGY: Dg Chest 2 View  Result Date: 06/04/2017 CLINICAL DATA:  Pain after fall. EXAM: CHEST  2 VIEW COMPARISON:  March 06, 2012 FINDINGS: The heart size and mediastinal contours are within normal limits. Both lungs are clear. The visualized skeletal structures are unremarkable. IMPRESSION: No active cardiopulmonary disease. Electronically Signed   By: Dorise Bullion III M.D   On: 06/04/2017 13:48   Ct Head Wo Contrast  Result Date: 06/04/2017 CLINICAL DATA:  83 year old female with a history of  fall EXAM: CT HEAD WITHOUT CONTRAST TECHNIQUE: Contiguous axial images were obtained from the base of the skull through the vertex without intravenous  contrast. COMPARISON:  05/06/2017 FINDINGS: Brain: No acute intracranial hemorrhage. No midline shift or mass effect. Gray-white differentiation maintained. Mild volume loss. Patchy hypodensity in the periventricular white matter. Vascular: Calcifications of the anterior circulation. Skull: No displaced fracture.  No aggressive bone lesion. Sinuses/Orbits: Unremarkable appearance of the orbits. Unremarkable paranasal sinuses. Other: None IMPRESSION: No CT evidence of acute intracranial abnormality. Chronic microvascular ischemic disease and associated intracranial atherosclerosis Electronically Signed   By: Corrie Mckusick D.O.   On: 06/04/2017 13:38    EKG:  Orders placed or performed during the hospital encounter of 06/04/17  . ED EKG  . ED EKG    ASSESSMENT AND PLAN:  Active Problems:   Weakness #1. Altered mental status, likely dementia related, get MRI of the brain to rule out stroke, check hemoglobin A1c, TSH, vitamin B12 level, valproic acid level, thiamine level #2. Ataxia, generalized weakness, as above, get MRI of the brain to rule out stroke, continue physical therapy, get social worker involved for skilled nursing facility placement #3. Hyperglycemia, get hemoglobin A1c to rule out diabetes #4, thrombocytopenia, get pro time, INR, PTT, follow platelet count in the morning #5. Acute urinary retention, in and out bladder catheterization, get urine cultures to rule out infection  Management plans discussed with the patient, family and they are in agreement.   DRUG ALLERGIES: No Known Allergies  CODE STATUS:     Code Status Orders        Start     Ordered   06/04/17 1916  Do not attempt resuscitation (DNR)  Continuous    Question Answer Comment  In the event of cardiac or respiratory ARREST Do not call a "code blue"   In the  event of cardiac or respiratory ARREST Do not perform Intubation, CPR, defibrillation or ACLS   In the event of cardiac or respiratory ARREST Use medication by any route, position, wound care, and other measures to relive pain and suffering. May use oxygen, suction and manual treatment of airway obstruction as needed for comfort.      06/04/17 1915    Code Status History    Date Active Date Inactive Code Status Order ID Comments User Context   This patient has a current code status but no historical code status.    Advance Directive Documentation     Most Recent Value  Type of Advance Directive  Healthcare Power of Attorney  Pre-existing out of facility DNR order (yellow form or pink MOST form)  -  "MOST" Form in Place?  -      TOTAL TIME TAKING CARE OF THIS PATIENT: 40 minutes.    Theodoro Grist M.D on 06/05/2017 at 11:36 AM  Between 7am to 6pm - Pager - [phone]  After 6pm go to [email or web address] - password [email or web address] [facility]  Office  [phone]  CC: Primary care physician; Valerie Roys, DO

## 2017-06-05 NOTE — NC FL2 (Signed)
[place] LEVEL OF CARE SCREENING TOOL     IDENTIFICATION  Patient Name: [name] Birthdate: 01-27-1934 Sex: female Admission Date (Current Location): 06/04/2017  [facility] and [place] Number:  Engineering geologist and Address:  [facility], [address] [place], [place] [address]      Provider Number: [id number]  Attending Physician Name and Address:  Theodoro Grist, MD  Relative Name and Phone Number:       Current Level of Care: Hospital Recommended Level of Care: [place] Prior Approval Number:    Date Approved/Denied:   PASRR Number:    Discharge Plan: SNF    Current Diagnoses: Patient Active Problem List   Diagnosis Date Noted  . Weakness 06/04/2017  . Late onset Alzheimer's disease without behavioral disturbance 09/07/2016  . B12 deficiency 02/07/2016  . Major depressive disorder, recurrent, moderate ([place]) 02/05/2016  . Vitamin D deficiency disease   . RBBB   . Thrombocytopenia ([place])   . Heart murmur   . Essential hypertension 09/05/2014    Orientation RESPIRATION BLADDER Height & Weight     Self, Place  Normal Continent Weight: 115 lb (52.2 kg) Height:  5\' 3"  (160 cm)  BEHAVIORAL SYMPTOMS/MOOD NEUROLOGICAL BOWEL NUTRITION STATUS      Continent    AMBULATORY STATUS COMMUNICATION OF NEEDS Skin   Extensive Assist Verbally Normal                       Personal Care Assistance Level of Assistance  Bathing, Feeding, Dressing Bathing Assistance: Maximum assistance Feeding assistance: Independent Dressing Assistance: Maximum assistance     Functional Limitations Info  Hearing   Hearing Info: Impaired      SPECIAL CARE FACTORS FREQUENCY  PT (By licensed PT)     PT Frequency: Up to 5 X per day, 5 days per week              Contractures      Additional Factors Info  Code Status Code Status Info: DNR             Current Medications (06/05/2017):  This is the current  hospital active medication list Current Facility-Administered Medications  Medication Dose Route Frequency Provider Last Rate Last Dose  . acetaminophen (TYLENOL) tablet 650 mg  650 mg Oral Q6H PRN Fritzi Mandes, MD      . buPROPion (WELLBUTRIN XL) 24 hr tablet 300 mg  300 mg Oral Daily Fritzi Mandes, MD   300 mg at 06/05/17 5027  . divalproex (DEPAKOTE) DR tablet 250 mg  250 mg Oral BID Fritzi Mandes, MD   250 mg at 06/05/17 7412  . donepezil (ARICEPT) tablet 10 mg  10 mg Oral QHS Fritzi Mandes, MD   10 mg at 06/04/17 2106  . ondansetron (ZOFRAN) tablet 4 mg  4 mg Oral Q6H PRN Fritzi Mandes, MD       Or  . ondansetron [facility]) injection 4 mg  4 mg Intravenous Q6H PRN Fritzi Mandes, MD      . QUEtiapine (SEROQUEL) tablet 25 mg  25 mg Oral QHS Fritzi Mandes, MD   25 mg at 06/04/17 2106  . senna-docusate (Senokot-S) tablet 1 tablet  1 tablet Oral QHS PRN Fritzi Mandes, MD      . traMADol Veatrice [facility]bon) tablet 50 mg  50 mg Oral Q6H PRN Fritzi Mandes, MD         Discharge Medications: Please see discharge summary for a list  of discharge medications.  Relevant Imaging Results:  Relevant Lab Results:   Additional Information SS# [id number]  Zettie Pho, LCSW

## 2017-06-05 NOTE — Progress Notes (Signed)
In/out cath performed.  Output total 959mL.

## 2017-06-06 DIAGNOSIS — R739 Hyperglycemia, unspecified: Secondary | ICD-10-CM | POA: Diagnosis not present

## 2017-06-06 DIAGNOSIS — D696 Thrombocytopenia, unspecified: Secondary | ICD-10-CM | POA: Diagnosis not present

## 2017-06-06 DIAGNOSIS — R4182 Altered mental status, unspecified: Secondary | ICD-10-CM | POA: Diagnosis not present

## 2017-06-06 DIAGNOSIS — R270 Ataxia, unspecified: Secondary | ICD-10-CM | POA: Diagnosis not present

## 2017-06-06 LAB — URINALYSIS, ROUTINE W REFLEX MICROSCOPIC
BILIRUBIN URINE: NEGATIVE
Glucose, UA: NEGATIVE mg/dL
Hgb urine dipstick: NEGATIVE
Ketones, ur: NEGATIVE mg/dL
Leukocytes, UA: NEGATIVE
NITRITE: NEGATIVE
PH: 6 (ref 5.0–8.0)
Protein, ur: NEGATIVE mg/dL
SPECIFIC GRAVITY, URINE: 1.009 (ref 1.005–1.030)

## 2017-06-06 LAB — HEMOGLOBIN A1C
Hgb A1c MFr Bld: 5 % (ref 4.8–5.6)
MEAN PLASMA GLUCOSE: 97 mg/dL

## 2017-06-06 MED ORDER — RISPERIDONE 0.5 MG PO TBDP
0.5000 mg | ORAL_TABLET | Freq: Two times a day (BID) | ORAL | Status: DC
  Administered 2017-06-06: 0.5 mg via ORAL
  Filled 2017-06-06 (×3): qty 1

## 2017-06-06 MED ORDER — RISPERIDONE 0.5 MG PO TBDP
0.5000 mg | ORAL_TABLET | Freq: Two times a day (BID) | ORAL | Status: DC | PRN
  Filled 2017-06-06: qty 1

## 2017-06-06 MED ORDER — RISPERIDONE 1 MG PO TBDP
1.0000 mg | ORAL_TABLET | Freq: Two times a day (BID) | ORAL | Status: DC | PRN
Start: 2017-06-06 — End: 2017-06-07
  Filled 2017-06-06: qty 1

## 2017-06-06 MED ORDER — HALOPERIDOL LACTATE 5 MG/ML IJ SOLN
1.0000 mg | Freq: Four times a day (QID) | INTRAMUSCULAR | Status: DC | PRN
  Administered 2017-06-06: 1 mg via INTRAVENOUS
  Filled 2017-06-06: qty 1

## 2017-06-06 NOTE — Progress Notes (Signed)
Alert to self. Calm and cooperative tonight. In and out cath performed at beginning of shift. Pt slept peacefully afterward. VSS.

## 2017-06-06 NOTE — Progress Notes (Signed)
Abnormal ECG. MD notified. No new orders at thsi time.   Deri Fuelling, RN

## 2017-06-06 NOTE — Clinical Social Work Placement (Addendum)
   CLINICAL SOCIAL WORK PLACEMENT  NOTE  Date:  06/06/2017  Patient Details  Name: [name] MRN: [id number] Date of Birth: 1934/02/09  Clinical Social Work is seeking post-discharge placement for this patient at the [place] level of care (*CSW will initial, date and re-position this form in  chart as items are completed):  Yes   Patient/family provided with [place] Work Department's list of facilities offering this level of care within the geographic area requested by the patient (or if unable, by the patient's family).  Yes   Patient/family informed of their freedom to choose among providers that offer the needed level of care, that participate in Medicare, Medicaid or managed care program needed by the patient, have an available bed and are willing to accept the patient.  Yes   Patient/family informed of [place]'s ownership interest in [facility] and [facility], as well as of the fact that they are under no obligation to receive care at these facilities.  PASRR submitted to EDS on 06/05/17     PASRR number received on 06/06/17 [id number]      Existing PASRR number confirmed on       FL2 transmitted to all facilities in geographic area requested by pt/family on 06/05/17     FL2 transmitted to all facilities within larger geographic area on       Patient informed that his/her managed care company has contracts with or will negotiate with certain facilities, including the following:        Yes   Patient/family informed of bed offers received.  Patient chooses bed at  [facility] )     Physician recommends and patient chooses bed at      Patient to be transferred to   on  .  Patient to be transferred to facility by       Patient family notified on   of transfer.  Name of family member notified:        PHYSICIAN       Additional Comment:    _______________________________________________ Lacorey Brusca, Veronia Beets,  LCSW 06/06/2017, 6:14 PM

## 2017-06-06 NOTE — Progress Notes (Signed)
Patient disoriented to place, time, and situation. Tele sitter in room and report has been called this morning. Patient cooperative but getting anxious because she is planning to go to the "beach with her husband today". Patient 1 assist up to the [facility]. Bed alarm on and patient has been reoriented to call bell system.    Deri Fuelling, RN

## 2017-06-06 NOTE — Progress Notes (Signed)
Clinical Education officer, museum (CSW) presented bed offers to patient's daughter Posey Pronto. Daughter chose [facility]. Health Team SNF authorization has been received, auth # [id number]. Per RN and MD tele-sitter has been discounted today. Plan is for patient to D/C to [facility] tomorrow pending medical clearance and if patient is able to remain without a sitter for 24 hours. [facility] admissions coordinator at [facility] is aware of above. CSW will continue to follow and assist as needed.   [employer], LCSW [phone]

## 2017-06-06 NOTE — Progress Notes (Signed)
Chaplain made an initial visit with pt. Pt states she was supposed to go to the beach last weekend but she ended in hospital. Pt indicates she not happy about it but she is getting over it. Pt talked about her church and church ministers, whom she said, were exceptional pastors. Pt is calm and exhibits good spirit this morning. Pt asked for prayers, which [place] provided with empathetic listening.     06/06/17 1100  Clinical Encounter Type  Visited With Patient  Visit Type Initial;Spiritual support  Referral From [place];Other (Comment)

## 2017-06-06 NOTE — Progress Notes (Signed)
Tele-sitter discontinued.  Deri Fuelling, RN

## 2017-06-06 NOTE — Progress Notes (Signed)
[place] at [place] NAME: [name]    MR#:  [id number]  DATE OF BIRTH:  September 15, 1934  SUBJECTIVE:  CHIEF COMPLAINT:   Chief Complaint  Patient presents with  . Fall   The patient is a 83 year old Caucasian female with history dementia, chronic thrombocytopenia, depression, who presents to the hospital with complaints of weakness, back pain, and sadness on her feet, falls. Labs were unremarkable. Patient was admitted to the hospital. Head CT revealed chronic microvascular ischemic disease. MRI of the brain revealed severe atrophy, chest x-ray was unremarkable. The patient was about 19 by physical therapist and recommended skilled nursing facility placement. While in the hospital. She was intermittently agitated, restless, trying to climb out of bed. She was initiated on Risperdal and Seroquel at night. Review of Systems  Unable to perform ROS: Dementia    VITAL SIGNS: Blood pressure (!) 121/39, pulse 80, temperature 98.7 F (37.1 C), temperature source Oral, resp. rate 16, height 5\' 3"  (1.6 m), weight 52.2 kg (115 lb), SpO2 97 %.  PHYSICAL EXAMINATION:   GENERAL:  83 y.o.-year-old patient lying in the bed with no acute distress, intermittently agitated, trying to climb out of bed.  EYES: Pupils equal, round, reactive to light and accommodation. No scleral icterus. Extraocular muscles intact.  HEENT: Head atraumatic, normocephalic. Oropharynx and nasopharynx clear.  NECK:  Supple, no jugular venous distention. No thyroid enlargement, no tenderness.  LUNGS: Normal breath sounds bilaterally, no wheezing, rales,rhonchi or crepitation. No use of accessory muscles of respiration.  CARDIOVASCULAR: S1, S2 normal. No murmurs, rubs, or gallops.  ABDOMEN: Soft, nontender, nondistended. Bowel sounds present. No organomegaly or mass.  EXTREMITIES: No pedal edema, cyanosis, or clubbing.  NEUROLOGIC: Cranial nerves II through XII are intact.  Muscle strength 5/5 in all extremities. Sensation intact. Gait not checked.  PSYCHIATRIC: The patient is alert , disoriented, mumbles  SKIN: No obvious rash, lesion, or ulcer.   ORDERS/RESULTS REVIEWED:   CBC  Recent Labs Lab 06/04/17 1317  WBC 8.3  HGB 14.1  HCT 40.7  PLT 125*  MCV 91.3  MCH 31.7  MCHC 34.7  RDW 13.3  LYMPHSABS 0.8*  MONOABS 1.0*  EOSABS 0.0  BASOSABS 0.0   ------------------------------------------------------------------------------------------------------------------  Chemistries   Recent Labs Lab 06/04/17 1317  NA 140  K 3.6  CL 103  CO2 27  GLUCOSE 101*  BUN 14  CREATININE 0.44  CALCIUM 9.5  AST 32  ALT 22  ALKPHOS 107  BILITOT 1.0   ------------------------------------------------------------------------------------------------------------------ estimated creatinine clearance is 45.4 mL/min (by C-G formula based on SCr of 0.44 mg/dL). ------------------------------------------------------------------------------------------------------------------  Recent Labs  06/05/17 1411  TSH 1.070    Cardiac Enzymes  Recent Labs Lab 06/04/17 1317  TROPONINI <0.03   ------------------------------------------------------------------------------------------------------------------ Invalid input(s): POCBNP ---------------------------------------------------------------------------------------------------------------  RADIOLOGY: Mr Brain Wo Contrast  Result Date: 06/05/2017 CLINICAL DATA:  Altered mental status, weakness. Dementia. Evaluate for acute stroke. Ataxia. EXAM: MRI HEAD WITHOUT CONTRAST TECHNIQUE: Multiplanar, multiecho pulse sequences of the brain and surrounding structures were obtained without intravenous contrast. COMPARISON:  CT head 06/04/2017. FINDINGS: Brain: No evidence for acute infarction, hemorrhage, mass lesion, or extra-axial fluid. Severe atrophy. Hydrocephalus ex vacuo. Extensive white matter disease. Vascular:  Normal flow voids. Skull and upper cervical spine: Normal marrow signal. Sinuses/Orbits: Negative.  BILATERAL cataract extraction. Other: None. IMPRESSION: Severe chronic changes of atrophy and small vessel disease, as described, correlate with dementia. No acute stroke or other significant finding. Electronically Signed   By:  Staci Righter M.D.   On: 06/05/2017 14:34    EKG:  Orders placed or performed during the hospital encounter of 06/04/17  . ED EKG  . ED EKG  . EKG 12-Lead  . EKG 12-Lead  . EKG 12-Lead  . EKG 12-Lead    ASSESSMENT AND PLAN:  Active Problems:   Weakness #1. Altered mental status, likely dementia related, MRI of the brain showed no acute stroke, hemoglobin A1c was 5.0, no diabetes, TSH was 1.07, vitamin B12 level was 556, valproic acid level was 51, thiamine level is pending #2. Ataxia, generalized weakness, as above, MRI of the brain revealed no stroke, physical therapist recommended skilled nursing facility placement, likely discharge tomorrow #3. Hyperglycemia, hemoglobin A1c was 5.0, no  diabetes #4, thrombocytopenia, pro time, INR, PTT were normal, follow platelet count as outpatient #5. Acute urinary retention, in and out bladder catheterization, urine cultures is pending, patient is afebrile, not on antibiotic at present #6. Dementia with agitation, continue Seroquel, adding daytime Risperdal, follow closely, EKG revealed a QTC of 480 ms  Management plans discussed with the patient, family and they are in agreement.   DRUG ALLERGIES: No Known Allergies  CODE STATUS:     Code Status Orders        Start     Ordered   06/04/17 1916  Do not attempt resuscitation (DNR)  Continuous    Question Answer Comment  In the event of cardiac or respiratory ARREST Do not call a "code blue"   In the event of cardiac or respiratory ARREST Do not perform Intubation, CPR, defibrillation or ACLS   In the event of cardiac or respiratory ARREST Use medication by any  route, position, wound care, and other measures to relive pain and suffering. May use oxygen, suction and manual treatment of airway obstruction as needed for comfort.      06/04/17 1915    Code Status History    Date Active Date Inactive Code Status Order ID Comments User Context   This patient has a current code status but no historical code status.    Advance Directive Documentation     Most Recent Value  Type of Advance Directive  Healthcare Power of Attorney  Pre-existing out of facility DNR order (yellow form or pink MOST form)  -  "MOST" Form in Place?  -      TOTAL TIME TAKING CARE OF THIS PATIENT: 40 minutes.    Theodoro Grist M.D on 06/06/2017 at 2:25 PM  Between 7am to 6pm - Pager - [phone]  After 6pm go to [email or web address] - password [email or web address] [facility]  Office  [phone]  CC: Primary care physician; Valerie Roys, DO

## 2017-06-07 DIAGNOSIS — R4182 Altered mental status, unspecified: Secondary | ICD-10-CM | POA: Diagnosis not present

## 2017-06-07 DIAGNOSIS — G308 Other Alzheimer's disease: Secondary | ICD-10-CM | POA: Diagnosis not present

## 2017-06-07 DIAGNOSIS — F330 Major depressive disorder, recurrent, mild: Secondary | ICD-10-CM | POA: Diagnosis not present

## 2017-06-07 DIAGNOSIS — E441 Mild protein-calorie malnutrition: Secondary | ICD-10-CM | POA: Diagnosis not present

## 2017-06-07 DIAGNOSIS — R270 Ataxia, unspecified: Secondary | ICD-10-CM | POA: Diagnosis not present

## 2017-06-07 DIAGNOSIS — S7002XA Contusion of left hip, initial encounter: Secondary | ICD-10-CM | POA: Diagnosis not present

## 2017-06-07 DIAGNOSIS — G309 Alzheimer's disease, unspecified: Secondary | ICD-10-CM | POA: Diagnosis not present

## 2017-06-07 DIAGNOSIS — R2681 Unsteadiness on feet: Secondary | ICD-10-CM | POA: Diagnosis not present

## 2017-06-07 DIAGNOSIS — R739 Hyperglycemia, unspecified: Secondary | ICD-10-CM | POA: Diagnosis not present

## 2017-06-07 DIAGNOSIS — F03918 Unspecified dementia, unspecified severity, with other behavioral disturbance: Secondary | ICD-10-CM

## 2017-06-07 DIAGNOSIS — F441 Dissociative fugue: Secondary | ICD-10-CM | POA: Diagnosis not present

## 2017-06-07 DIAGNOSIS — R531 Weakness: Secondary | ICD-10-CM | POA: Diagnosis not present

## 2017-06-07 DIAGNOSIS — F0281 Dementia in other diseases classified elsewhere with behavioral disturbance: Secondary | ICD-10-CM | POA: Diagnosis not present

## 2017-06-07 DIAGNOSIS — F331 Major depressive disorder, recurrent, moderate: Secondary | ICD-10-CM | POA: Diagnosis not present

## 2017-06-07 DIAGNOSIS — R296 Repeated falls: Secondary | ICD-10-CM | POA: Diagnosis not present

## 2017-06-07 DIAGNOSIS — E538 Deficiency of other specified B group vitamins: Secondary | ICD-10-CM | POA: Diagnosis not present

## 2017-06-07 DIAGNOSIS — F0391 Unspecified dementia with behavioral disturbance: Secondary | ICD-10-CM

## 2017-06-07 DIAGNOSIS — F339 Major depressive disorder, recurrent, unspecified: Secondary | ICD-10-CM | POA: Diagnosis not present

## 2017-06-07 DIAGNOSIS — S7 Contusion of hip: Secondary | ICD-10-CM | POA: Diagnosis not present

## 2017-06-07 DIAGNOSIS — Z7401 Bed confinement status: Secondary | ICD-10-CM | POA: Diagnosis not present

## 2017-06-07 DIAGNOSIS — D696 Thrombocytopenia, unspecified: Secondary | ICD-10-CM | POA: Diagnosis not present

## 2017-06-07 DIAGNOSIS — M6281 Muscle weakness (generalized): Secondary | ICD-10-CM | POA: Diagnosis not present

## 2017-06-07 DIAGNOSIS — G301 Alzheimer's disease with late onset: Secondary | ICD-10-CM | POA: Diagnosis not present

## 2017-06-07 DIAGNOSIS — R6889 Other general symptoms and signs: Secondary | ICD-10-CM | POA: Diagnosis not present

## 2017-06-07 LAB — VITAMIN B1: Vitamin B1 (Thiamine): 155.1 nmol/L (ref 66.5–200.0)

## 2017-06-07 LAB — URINE CULTURE

## 2017-06-07 MED ORDER — RISPERIDONE 1 MG PO TBDP: 1.0000 mg | ORAL_TABLET | Freq: Two times a day (BID) | ORAL | 3 refills | Status: DC | PRN

## 2017-06-07 MED ORDER — DIVALPROEX SODIUM 250 MG PO DR TAB: 250.0000 mg | DELAYED_RELEASE_TABLET | Freq: Two times a day (BID) | ORAL | 0 refills | Status: DC

## 2017-06-07 NOTE — Care Management Important Message (Deleted)
Important Message  Patient Details  Name: [name] MRN: [id number] Date of Birth: 07-Feb-1934   Medicare Important Message Given:  Yes    Jolly Mango, RN 06/07/2017, 9:39 AM

## 2017-06-07 NOTE — Clinical Social Work Placement (Signed)
   CLINICAL SOCIAL WORK PLACEMENT  NOTE  Date:  06/07/2017  Patient Details  Name: [name] MRN: [id number] Date of Birth: 1934/07/31  Clinical Social Work is seeking post-discharge placement for this patient at the [place] level of care (*CSW will initial, date and re-position this form in  chart as items are completed):  Yes   Patient/family provided with [place] Work Department's list of facilities offering this level of care within the geographic area requested by the patient (or if unable, by the patient's family).  Yes   Patient/family informed of their freedom to choose among providers that offer the needed level of care, that participate in Medicare, Medicaid or managed care program needed by the patient, have an available bed and are willing to accept the patient.  Yes   Patient/family informed of [place]'s ownership interest in [facility] and [facility], as well as of the fact that they are under no obligation to receive care at these facilities.  PASRR submitted to EDS on 06/05/17     PASRR number received on 06/06/17     Existing PASRR number confirmed on       FL2 transmitted to all facilities in geographic area requested by pt/family on 06/05/17     FL2 transmitted to all facilities within larger geographic area on       Patient informed that his/her managed care company has contracts with or will negotiate with certain facilities, including the following:        Yes   Patient/family informed of bed offers received.  Patient chooses bed at  [facility] )     Physician recommends and patient chooses bed at      Patient to be transferred to  [facility] ) on 06/07/17.  Patient to be transferred to facility by  [facility] )     Patient family notified on 06/07/17 of transfer.  Name of family member notified:   (Patient's daughter Posey Pronto is aware of D/C today. )     PHYSICIAN        Additional Comment:    _______________________________________________ Kenta Laster, Veronia Beets, LCSW 06/07/2017, 12:55 PM

## 2017-06-07 NOTE — Progress Notes (Signed)
Patient is medically stable for D/C to [facility] today. Per Seth Bake admissions coordinator at [facility] patient can come today to room 202-A. RN will call report at [phone] and arrange EMS for transport. Health Team authorization for SNF has been received. Health Team case manager aware of above. Clinical Education officer, museum (CSW) sent D/C orders to [facility] via [place]. CSW contacted patient's daughter Posey Pronto and made her aware of above. Please reconsult if future social work needs arise. CSW signing off.   [employer], LCSW [phone]

## 2017-06-07 NOTE — Discharge Summary (Signed)
[name] at [place] NAME: [name]    MR#:  [id number]  DATE OF BIRTH:  1934/05/31  DATE OF ADMISSION:  06/04/2017 ADMITTING PHYSICIAN: Fritzi Mandes, MD  DATE OF DISCHARGE: No discharge date for patient encounter.  PRIMARY CARE PHYSICIAN: Park Liter P, DO     ADMISSION DIAGNOSIS:  Weakness [R53.1] Unsteady gait [R26.81] Fall, initial encounter [W19.XXXA]  DISCHARGE DIAGNOSIS:  Active Problems:   Weakness   Altered mental status   Hyperglycemia   Dementia with behavioral disturbance   SECONDARY DIAGNOSIS:   Past Medical History:  Diagnosis Date  . Alzheimer's dementia, late onset   . Depression   . Dizziness   . Heart murmur   . Hx of migraine headaches   . Hypertension   . RBBB   . Thrombocytopenia ([place])   . Vitamin D deficiency disease     .pro HOSPITAL COURSE:   The patient is a 82 year old Caucasian female with history dementia, chronic thrombocytopenia, depression, who presents to the hospital with complaints of weakness, back pain, and unsteadiness on her feet, falls. Labs were unremarkable. Patient was admitted to the hospital. Head CT revealed chronic microvascular ischemic disease. MRI of the brain revealed severe atrophy, chest x-ray was unremarkable. The patient was evaluated by physical therapist and recommended skilled nursing facility placement. While in the hospital, she was intermittently agitated, restless, trying to climb out of bed. She was initiated on Risperdal and continued on Seroquel at night. She was felt to be stable to be discharged to skilled nursing facility today. Discussion by problem: #1. Generalized weakness, unsteady gait and frequent falls, MRI of the brain revealed no acute events, but severe atrophy, consistent with history of dementia. Patient was evaluated by physical therapist and recommended skilled nursing facility placement #2. Alzheimer's dementia with behavioral  disturbance, she is to continue Seroquel at night, added Risperdal twice daily as needed for agitation/restlessness, advance medications as needed and discuss risks as well as benefits with the family.  #3. Degenerative arthritis, continue Tylenol #4. Hyperglycemia, hemoglobin A1c was 5.0, no diabetes #5 chronic thrombocytopenia, platelet count was stable, follow-up as outpatient #6. Acute urinary retention. Patient received in and out bladder catheterization in the hospital, urine culture was taken, pending, patient remained afebrile, not on antibiotics while in the hospital DISCHARGE CONDITIONS:   Stable  CONSULTS OBTAINED:    DRUG ALLERGIES:  No Known Allergies  DISCHARGE MEDICATIONS:   Current Discharge Medication List    START taking these medications   Details  risperiDONE (RISPERDAL M-TABS) 1 MG disintegrating tablet Take 1 tablet (1 mg total) by mouth 2 (two) times daily as needed (Agitation, restlessness). Qty: 60 tablet, Refills: 3      CONTINUE these medications which have NOT CHANGED   Details  acetaminophen (TYLENOL) 325 MG tablet Take 650 mg by mouth every 6 (six) hours as needed.    buPROPion (WELLBUTRIN XL) 300 MG 24 hr tablet Take 1 tablet (300 mg total) by mouth daily. Qty: 90 tablet, Refills: 1    divalproex (DEPAKOTE) 250 MG DR tablet Take 1 tablet (250 mg total) by mouth 2 (two) times daily. Qty: 180 tablet, Refills: 0    donepezil (ARICEPT) 10 MG tablet Take 1 tablet (10 mg total) by mouth at bedtime. Qty: 30 tablet, Refills: 2    QUEtiapine (SEROQUEL) 25 MG tablet 1/2-1 tab BID PRN anxiety and 1 tab qHS Qty: 30 tablet, Refills: 3  DISCHARGE INSTRUCTIONS:    The patient is to follow-up with her primary care physician within one week after discharge  If you experience worsening of your admission symptoms, develop shortness of breath, life threatening emergency, suicidal or homicidal thoughts you must seek medical attention immediately by  calling 911 or calling your MD immediately  if symptoms less severe.  You Must read complete instructions/literature along with all the possible adverse reactions/side effects for all the Medicines you take and that have been prescribed to you. Take any new Medicines after you have completely understood and accept all the possible adverse reactions/side effects.   Please note  You were cared for by a hospitalist during your hospital stay. If you have any questions about your discharge medications or the care you received while you were in the hospital after you are discharged, you can call the unit and asked to speak with the hospitalist on call if the hospitalist that took care of you is not available. Once you are discharged, your primary care physician will handle any further medical issues. Please note that NO REFILLS for any discharge medications will be authorized once you are discharged, as it is imperative that you return to your primary care physician (or establish a relationship with a primary care physician if you do not have one) for your aftercare needs so that they can reassess your need for medications and monitor your lab values.    Today   CHIEF COMPLAINT:   Chief Complaint  Patient presents with  . Fall    HISTORY OF PRESENT ILLNESS:     VITAL SIGNS:  Blood pressure (!) 131/41, pulse 79, temperature 98.3 F (36.8 C), temperature source Oral, resp. rate 18, height 5\' 3"  (1.6 m), weight 52.2 kg (115 lb), SpO2 99 %.  I/O:   Intake/Output Summary (Last 24 hours) at 06/07/17 0826 Last data filed at 06/06/17 2055  Gross per 24 hour  Intake              720 ml  Output              225 ml  Net              495 ml    PHYSICAL EXAMINATION:  GENERAL:  83 y.o.-year-old patient lying in the bed with no acute distress.  EYES: Pupils equal, round, reactive to light and accommodation. No scleral icterus. Extraocular muscles intact.  HEENT: Head atraumatic, normocephalic.  Oropharynx and nasopharynx clear.  NECK:  Supple, no jugular venous distention. No thyroid enlargement, no tenderness.  LUNGS: Normal breath sounds bilaterally, no wheezing, rales,rhonchi or crepitation. No use of accessory muscles of respiration.  CARDIOVASCULAR: S1, S2 normal. No murmurs, rubs, or gallops.  ABDOMEN: Soft, non-tender, non-distended. Bowel sounds present. No organomegaly or mass.  EXTREMITIES: No pedal edema, cyanosis, or clubbing.  NEUROLOGIC: Cranial nerves II through XII are intact. Muscle strength 5/5 in all extremities. Sensation intact. Gait not checked.  PSYCHIATRIC: The patient is alert and oriented x 3.  SKIN: No obvious rash, lesion, or ulcer.   DATA REVIEW:   CBC  Recent Labs Lab 06/04/17 1317  WBC 8.3  HGB 14.1  HCT 40.7  PLT 125*    Chemistries   Recent Labs Lab 06/04/17 1317  NA 140  K 3.6  CL 103  CO2 27  GLUCOSE 101*  BUN 14  CREATININE 0.44  CALCIUM 9.5  AST 32  ALT 22  ALKPHOS 107  BILITOT 1.0  Cardiac Enzymes  Recent Labs Lab 06/04/17 1317  TROPONINI <0.03    Microbiology Results  Results for orders placed or performed during the hospital encounter of 06/04/17  MRSA PCR Screening     Status: None   Collection Time: 06/05/17  9:30 AM  Result Value Ref Range Status   MRSA by PCR NEGATIVE NEGATIVE Final    Comment:        The GeneXpert MRSA Assay (FDA approved for NASAL specimens only), is one component of a comprehensive MRSA colonization surveillance program. It is not intended to diagnose MRSA infection nor to guide or monitor treatment for MRSA infections.     RADIOLOGY:  Mr Brain Wo Contrast  Result Date: 06/05/2017 CLINICAL DATA:  Altered mental status, weakness. Dementia. Evaluate for acute stroke. Ataxia. EXAM: MRI HEAD WITHOUT CONTRAST TECHNIQUE: Multiplanar, multiecho pulse sequences of the brain and surrounding structures were obtained without intravenous contrast. COMPARISON:  CT head 06/04/2017.  FINDINGS: Brain: No evidence for acute infarction, hemorrhage, mass lesion, or extra-axial fluid. Severe atrophy. Hydrocephalus ex vacuo. Extensive white matter disease. Vascular: Normal flow voids. Skull and upper cervical spine: Normal marrow signal. Sinuses/Orbits: Negative.  BILATERAL cataract extraction. Other: None. IMPRESSION: Severe chronic changes of atrophy and small vessel disease, as described, correlate with dementia. No acute stroke or other significant finding. Electronically Signed   By: Staci Righter M.D.   On: 06/05/2017 14:34    EKG:   Orders placed or performed during the hospital encounter of 06/04/17  . ED EKG  . ED EKG  . EKG 12-Lead  . EKG 12-Lead  . EKG 12-Lead  . EKG 12-Lead      Management plans discussed with the patient, family and they are in agreement.  CODE STATUS:     Code Status Orders        Start     Ordered   06/04/17 1916  Do not attempt resuscitation (DNR)  Continuous    Question Answer Comment  In the event of cardiac or respiratory ARREST Do not call a "code blue"   In the event of cardiac or respiratory ARREST Do not perform Intubation, CPR, defibrillation or ACLS   In the event of cardiac or respiratory ARREST Use medication by any route, position, wound care, and other measures to relive pain and suffering. May use oxygen, suction and manual treatment of airway obstruction as needed for comfort.      06/04/17 1915    Code Status History    Date Active Date Inactive Code Status Order ID Comments User Context   This patient has a current code status but no historical code status.    Advance Directive Documentation     Most Recent Value  Type of Advance Directive  Healthcare Power of Attorney  Pre-existing out of facility DNR order (yellow form or pink MOST form)  -  "MOST" Form in Place?  -      TOTAL TIME TAKING CARE OF THIS PATIENT: 40 minutes.    Theodoro Grist M.D on 06/07/2017 at 8:26 AM  Between 7am to 6pm - Pager -  [phone]  After 6pm go to [email or web address] - password [email or web address] [facility]  Office  [phone]  CC: Primary care physician; Valerie Roys, DO

## 2017-06-08 ENCOUNTER — Telehealth: Payer: Self-pay | Admitting: Neurology

## 2017-06-08 NOTE — Telephone Encounter (Signed)
Order sent to [facility] at [phone] with confirmation received to d/c Seroquel 25 mg. They can be reached at [phone].

## 2017-06-08 NOTE — Telephone Encounter (Signed)
I generally don't try to override nursing facilities since they are seeing her.  I presume they don't have her on seroquel with the risperdal since they are pretty similar drugs and generally are not given together.  Otherwise, the risperdal is probably okay from my standpoint for agitation

## 2017-06-08 NOTE — Telephone Encounter (Signed)
The daughter states she is on both drugs. Both medications on d/c list from the hospital as well. Should I send order to d/c Seroquel?

## 2017-06-08 NOTE — Telephone Encounter (Signed)
PT fell, is in a rehab facility and the medication has been adjusted, needs to cancel appointment on Friday, please advise on medication changes

## 2017-06-08 NOTE — Telephone Encounter (Signed)
That is fine.  How much seroquel?  Just 25 mg?

## 2017-06-08 NOTE — Telephone Encounter (Signed)
Called patient's daughter.  She states that patient was due to come in this Friday.  Her last visit with Dr. Carles Collet she was put on Depakote 250 mg twice daily and to remain on Seroquel. Taking her off Seroquel (25 mg) was to be discussed at visit.  She had to cancel visit because patient had just fallen and was put in the hospital a couple days ago. At that time they added Risperdal PRN for Agitation. The nursing facility had previously prescribed Ativan for this but it didn't help. The daughter is unaware if this is helping yet but wanted to make sure it was okay with Dr. Carles Collet for patient to be on medication.  She also is unsure if she can go ahead and stop Seroquel or if she had to wait until she is seen.  Please advise.

## 2017-06-09 ENCOUNTER — Telehealth: Payer: Self-pay | Admitting: Internal Medicine

## 2017-06-09 DIAGNOSIS — S7002XA Contusion of left hip, initial encounter: Secondary | ICD-10-CM | POA: Diagnosis not present

## 2017-06-09 DIAGNOSIS — F330 Major depressive disorder, recurrent, mild: Secondary | ICD-10-CM | POA: Diagnosis not present

## 2017-06-09 DIAGNOSIS — M6281 Muscle weakness (generalized): Secondary | ICD-10-CM | POA: Diagnosis not present

## 2017-06-09 DIAGNOSIS — F441 Dissociative fugue: Secondary | ICD-10-CM | POA: Diagnosis not present

## 2017-06-09 DIAGNOSIS — E441 Mild protein-calorie malnutrition: Secondary | ICD-10-CM | POA: Diagnosis not present

## 2017-06-09 DIAGNOSIS — G308 Other Alzheimer's disease: Secondary | ICD-10-CM | POA: Diagnosis not present

## 2017-06-09 NOTE — Telephone Encounter (Signed)
PLEASE NOTE: All timestamps contained within this report are represented as [place] Standard Time. CONFIDENTIALTY NOTICE: This fax transmission is intended only for the addressee. It contains information that is legally privileged, confidential or otherwise protected from use or disclosure. If you are not the intended recipient, you are strictly prohibited from reviewing, disclosing, copying using or disseminating any of this information or taking any action in reliance on or regarding this information. If you have received this fax in error, please notify us immediately by telephone so that we can arrange for its return to [place]. Phone: [phone], Toll-Free: [phone], Fax: [phone] Page: 1 of 1 Call Id: [id number] [place] Patient Name: [name] Gender: Female DOB: 03-Apr-1934 Age: 83 Y 83 M 16 D Return Phone Number: [phone] (Primary) City/State/Zip: [place] Client [place] Night - Client Client Site [place] Physician Viviana Simpler - MD Who Is Calling Patient / Member / Family / Caregiver Call Type Triage / Clinical Caller Name Benjamine Mola Relationship To Patient Care Giver Return Phone Number [phone] (Primary) Chief Complaint Paging or Request for Consult Reason for Call Symptomatic / Request for [place] is needing a portable Xray for a pts hip CB# No Triage Reason Other Nurse Assessment Nurse: Yolanda Bonine. RN, Sunday Spillers Date/Time (Eastern Time): 06/09/2017 2:31:25 AM Confirm and document reason for call. If symptomatic, describe symptoms. ---Need an order for a Left hip or pelvic xray. Caller states the resident fell about 15 minutes ago. She is complaining of hip pain when touched. Hx of falls, weakness- physical therapy, alzheimers. Select reason for no triage.  ---Other Please document clinical information provided and list any resource used. ---Is in [place] [facility] Guidelines [place]. Time Eilene Ghazi Time) Disposition Final User 06/09/2017 2:37:04 AM Clinical Call Yes Yolanda Bonine. RN, [facility] Paging DoctorName Phone DateTime Action Result/Outcome Notes Einar Pheasant - MD [phone] 06/09/2017 2:36:18 AM Doctor Paged Called On Call Provider - Teresita Madura - MD 06/09/2017 2:36:47 AM Message Result Spoke with On Call - General Transferred Dr Nicki Reaper to [place] from [facility]

## 2017-06-09 NOTE — Telephone Encounter (Signed)
Benjamine Mola from [facility] called and reported pt was found sitting on the ground.  States had got out of bed to go to the bathroom.  Is normally not up and walking around (per nurse).  On palpation, complains of hip pain.  Denies hitting head, but was unwitnessed.  Able to move feet and legs.  Request xray of hip.  ok'd order for xray.  Nurse does not feel pt needs to go to ER.  Discussed unwitnessed fall protocol and instructed to do frequent neuro checks and checks on pt to confirm no change in her mental status.

## 2017-06-09 NOTE — Telephone Encounter (Signed)
X-ray was negative Saw her today with her family

## 2017-06-10 ENCOUNTER — Ambulatory Visit: Payer: PPO | Admitting: Neurology

## 2017-06-12 DIAGNOSIS — F331 Major depressive disorder, recurrent, moderate: Secondary | ICD-10-CM | POA: Diagnosis not present

## 2017-06-12 DIAGNOSIS — F0281 Dementia in other diseases classified elsewhere with behavioral disturbance: Secondary | ICD-10-CM | POA: Diagnosis not present

## 2017-06-12 DIAGNOSIS — F339 Major depressive disorder, recurrent, unspecified: Secondary | ICD-10-CM | POA: Diagnosis not present

## 2017-06-12 DIAGNOSIS — G309 Alzheimer's disease, unspecified: Secondary | ICD-10-CM | POA: Diagnosis not present

## 2017-06-12 DIAGNOSIS — M6281 Muscle weakness (generalized): Secondary | ICD-10-CM | POA: Diagnosis not present

## 2017-06-12 DIAGNOSIS — R2681 Unsteadiness on feet: Secondary | ICD-10-CM | POA: Diagnosis not present

## 2017-06-12 DIAGNOSIS — R270 Ataxia, unspecified: Secondary | ICD-10-CM | POA: Diagnosis not present

## 2017-06-12 DIAGNOSIS — E538 Deficiency of other specified B group vitamins: Secondary | ICD-10-CM | POA: Diagnosis not present

## 2017-06-12 DIAGNOSIS — R296 Repeated falls: Secondary | ICD-10-CM | POA: Diagnosis not present

## 2017-06-12 DIAGNOSIS — E441 Mild protein-calorie malnutrition: Secondary | ICD-10-CM | POA: Diagnosis not present

## 2017-06-12 DIAGNOSIS — G301 Alzheimer's disease with late onset: Secondary | ICD-10-CM | POA: Diagnosis not present

## 2017-06-21 ENCOUNTER — Ambulatory Visit: Payer: PPO | Admitting: Family Medicine

## 2017-06-21 NOTE — Progress Notes (Signed)
The patient is seen in neurologic consultation at the request of Dr. Sanda Klein for the evaluation of memory.  The patient is accompanied by her daughter and son who supplement the history.  The patient is a 83 y.o. year old female who has had memory issues for about 6 months.  She noted that she would have to write things down.   Memory loss apparently got worse after the death of her son last 08-17-2023 (who had downs syndrome).  She was referred to psychiatry for MDD in 02/2016 but they state that for some reason, an appt wasn't made.  Her family notes that she has called them multiple times a day for the last 6 weeks and asked them when her husband was coming home but he has been dead for 6 years.  The patient lives alone with her dog and she is able to take care of her dog.   The patient does do the finances in the home and reports that she has no trouble with that.  The patient does drive and she reports that she has no difficulty with that but family reports that they haven't driven with her.  There have not been any motor vehicle accidents in the recent years.  The patient does cook.  There is no difficulty remembering common recipes.  Her cooking still tastes good.    The stovetop has not been left on accidentally.  The patient is able to perform her own ADL's.  The patient is able to distribute her own medications but states that she is only on a vitamin and antidepressant but her daughter does report that she was on a medication for UTI and she did get confused with that.  The patients bladder and bowel are under good control.  There have been no behavioral changes over the years.  There have been no hallucinations.  Fam hx of memory issue in her mother.  09/07/16 update:  Pt f/u today, accompanied by her daughter who supplement the history.  she saw Dr. Si Raider for neuropscyhometric testing on 06/08/16 and subsequently had a feedback session with her where results and recommendations were given to her.   These are detailed within the chart.  This confirmed the diagnosis of Alzheimer's dementia.  She talked to the patient about needing a driving evaluation, if the patient wanted to continue driving.  Last visit, I had asked her to stop driving.  She states that she has not driven.  Daughter states that they had an issue with the car and didn't get it fixed.  She had an MRI of the brain on 05/18/2016 that I had the opportunity to review.  There was mild atrophy and moderate small vessel disease.  Her B12 was rechecked since last visit and was now normal at 534.  She quit taking those in July.   Aricept was started last visit.  She is tolerating it well, without SE.  Her daughter is administering this for a month and before that she may not have been taking it.  She was dizzy but started drinking protein shakes (premier) and it helped.    03/07/17 update:  Patient follows up today, accompanied by her daughter who supplements the history.  Last visit, we talked about the fact that the patient should no longer be living alone.  Daughter reports that she now has a caregiver from 10am-5pm.  Daughter does state that she is eating better with caregivers.  Pt still insists that her husband is great and  he is just at work (he is deceased). She is on Aricept.  She tolerates the medication well, without side effects.  Patient's daughter called me in February, as Seroquel was prescribed the patient and the patient's daughter had read about the blackbox warning. That medication hasn't helped yet.  Biggest issue is angry/agitation.  One fall 3 weeks ago because was dehydrated.   06/22/17 update:  Patient seen in follow-up today.  She is accompanied by her daughter who supplements the history.  Patient is on Aricept.  Last visit, Depakote was started for mood/anger management.  She is on 250 mg twice a day.  She was already on Seroquel, 25 mg at night, which I did not change last visit.  The records that were made available to me  were reviewed.  Has been evaluated at the hospital multiple times for falls.  Several of these falls she hit her head.  She has had multiple CTs of the head since last visit.  Fortunately, these were all nonacute.  The most recent fall resulted in an admission to the hospital from 6/23-6/26/18.  Had MRI of the brain on 06/05/17 and was non acute.   Pt was d/c to [facility].  Was previously at assisted level living and now at SNF level for rehab (was not at [facility] previously).  Daughter unsure if this is permanent situation but thinks that it likely will be.   Hospital started her on risperdal 1 mg prn bid in addition to seroquel.  Her daughter called me about that and we went ahead and d/c the seroquel since daughter wasn't sure how helpful it had been.  SNF sent meds and neither seroquel nor risperdal on list but agitation better.  On remeron.  No longer walking, even with PT.  Daughter states that [facility] has done x-rays of the hip.  Pt is c/o hip pain.  Has had several falls since back to SNF   Allergies: No Known Allergies  Current Outpatient Prescriptions on File Prior to Visit  Medication Sig Dispense Refill  . acetaminophen (TYLENOL) 325 MG tablet Take 650 mg by mouth every 6 (six) hours as needed.    Marland Kitchen buPROPion (WELLBUTRIN XL) 300 MG 24 hr tablet Take 1 tablet (300 mg total) by mouth daily. 90 tablet 1  . divalproex (DEPAKOTE) 250 MG DR tablet Take 1 tablet (250 mg total) by mouth 2 (two) times daily. 180 tablet 0  . donepezil (ARICEPT) 10 MG tablet Take 1 tablet (10 mg total) by mouth at bedtime. 30 tablet 2  . risperiDONE (RISPERDAL M-TABS) 1 MG disintegrating tablet Take 1 tablet (1 mg total) by mouth 2 (two) times daily as needed (Agitation, restlessness). 60 tablet 3  . QUEtiapine (SEROQUEL) 25 MG tablet 1/2-1 tab BID PRN anxiety and 1 tab qHS (Patient not taking: Reported on 06/22/2017) 30 tablet 3   No current facility-administered medications on file prior to visit.      Past  Medical History:  Diagnosis Date  . Alzheimer's dementia, late onset   . Depression   . Dizziness   . Heart murmur   . Hx of migraine headaches   . Hypertension   . RBBB   . Thrombocytopenia ([place])   . Vitamin D deficiency disease     Past Surgical History:  Procedure Laterality Date  . ABDOMINAL HYSTERECTOMY    . APPENDECTOMY    . CATARACT EXTRACTION, BILATERAL    . CHOLECYSTECTOMY      Social History  Social History  . Marital status: Widowed    Spouse name: N/A  . Number of children: N/A  . Years of education: N/A   Occupational History  . retired     Dance movement psychotherapist   Social History Main Topics  . Smoking status: Former Smoker    Packs/day: 0.50    Years: 15.00    Types: Cigarettes    Quit date: 12/14/2007  . Smokeless tobacco: Never Used  . Alcohol use 0.0 oz/week     Comment: glass of wine every couple weeks  . Drug use: No  . Sexual activity: Not on file   Other Topics Concern  . Not on file   Social History Narrative  . No narrative on file    Family Status  Relation Status  . Mother Deceased       heart disease  . Father Deceased       unknown  . Sister Alive       atrial fib, rheumatoid  . Sister Alive       unknown  . Brother Alive       unknown  . Daughter Alive       healthy  . Son Alive       healthy  . Son Deceased       down syndrome  . Sister (Not Specified)    ROS:  A complete 10 system ROS was obtained and was unremarkable except as above.   VITALS:   Vitals:   06/22/17 0950  BP: 100/70  Pulse: 94  SpO2: 96%  Weight: 95 lb (43.1 kg)  Height: 5\' 4"  (1.626 m)    Wt Readings from Last 3 Encounters:  06/22/17 95 lb (43.1 kg)  06/04/17 115 lb (52.2 kg)  05/06/17 115 lb (52.2 kg)    HEENT:  Normocephalic, atraumatic. The mucous membranes are moist. The superficial temporal arteries are without ropiness or tenderness. Cardiovascular: Regular rate and rhythm. Lungs: Clear to auscultation bilaterally. Neck: There are  no carotid bruits noted bilaterally.  NEUROLOGICAL:  Orientation:  She is alert today.  She is very repetitive.  She tells me the same story 5 times.  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 Montreal Cognitive Assessment  05/03/2016  Visuospatial/ Executive (0/5) 2  Naming (0/3) 2  Attention: Read list of digits (0/2) 2  Attention: Read list of letters (0/1) 1  Attention: Serial 7 subtraction starting at 100 (0/3) 1  Language: Repeat phrase (0/2) 2  Language : Fluency (0/1) 0  Abstraction (0/2) 2  Delayed Recall (0/5) 0  Orientation (0/6) 3  Total 15  Adjusted Score (based on education) 15    Cranial nerves: There is good facial symmetry.  Extraocular muscles are intact and visual fields are full to confrontational testing. Speech is fluent and clear. Soft palate rises symmetrically and there is no tongue deviation. Hearing is intact to conversational tone. Tone: Tone is good throughout. Sensation: Sensation is intact to light touch throughout. Coordination:  The patient has no difficulty with RAM's or FNF bilaterally. Motor: Strength is 5/5 in the bilateral upper and lower extremities. There is no pronator drift.  There are no fasciculations noted. Gait and Station: The patient gets up with minimal assist.  She was given a walker.  L leg abducts some and she is mildly unsteady Frontal release:  Positive palmomental and glabellar tap.    LABS:    Chemistry      Component Value Date/Time  NA 140 06/04/2017 1317   NA 146 (H) 03/21/2017 1059   NA 141 03/06/2012 1912   K 3.6 06/04/2017 1317   K 3.5 03/06/2012 1912   CL 103 06/04/2017 1317   CL 104 03/06/2012 1912   CO2 27 06/04/2017 1317   CO2 25 03/06/2012 1912   BUN 14 06/04/2017 1317   BUN 12 03/21/2017 1059   BUN 17 03/06/2012 1912   CREATININE 0.44 06/04/2017 1317   CREATININE 0.58 (L) 03/06/2012 1912      Component Value Date/Time   CALCIUM 9.5 06/04/2017 1317   CALCIUM 9.0 03/06/2012 1912   ALKPHOS  107 06/04/2017 1317   ALKPHOS 144 (H) 03/06/2012 1912   AST 32 06/04/2017 1317   AST 38 (H) 03/06/2012 1912   ALT 22 06/04/2017 1317   ALT 30 03/06/2012 1912   BILITOT 1.0 06/04/2017 1317   BILITOT 0.6 03/21/2017 1059   BILITOT 2.1 (H) 03/06/2012 1912     Lab Results  Component Value Date   [id number] 462 06/05/2017   IMPRESSIONS/PLAN:  1.  Alzheimer's dementia  -she saw Dr. Si Raider for neuropscyhometric testing on 06/08/16 and subsequently had a feedback session with her where results and recommendations were given to her.  These are detailed within the chart.  This confirmed the diagnosis of Alzheimer's dementia  -We discussed the importance of physical and mental exercises and I explained to the patient and family what this means.    -now living at [facility]  -We'll continue on donepezil.  -risperdal started when in the hospital on prn basis but now off according to SNF records.  seroquel did not help at low dose and off now  -on depakote - will continue for now.  Her agitation is better  2.  B12 deficiency  -better on B12 injections but then stopped the injection after it was checked.  Needs to restart and will write for that at [facility]  3.  Multiple falls  -wonder if related to L hip issue.  Will ask Dr. Silvio Pate to investigate further and perhaps have ortho investigate.  -discussed option of merry walker  4.  Follow up is anticipated in the 6 few months, sooner should new neurologic issues arise.  Much greater than 50% of this visit was spent in counseling and coordinating care.  Total face to face time:  30 min.  This did not include the 35 min of non face to face time in record review.

## 2017-06-22 ENCOUNTER — Ambulatory Visit: Payer: PPO | Admitting: Neurology

## 2017-06-22 ENCOUNTER — Ambulatory Visit (INDEPENDENT_AMBULATORY_CARE_PROVIDER_SITE_OTHER): Payer: PPO | Admitting: Neurology

## 2017-06-22 ENCOUNTER — Encounter: Payer: Self-pay | Admitting: Neurology

## 2017-06-22 VITALS — BP 100/70 | HR 94 | Ht 64.0 in | Wt 95.0 lb

## 2017-06-22 DIAGNOSIS — R296 Repeated falls: Secondary | ICD-10-CM | POA: Diagnosis not present

## 2017-06-22 DIAGNOSIS — G301 Alzheimer's disease with late onset: Secondary | ICD-10-CM | POA: Diagnosis not present

## 2017-06-22 DIAGNOSIS — F0281 Dementia in other diseases classified elsewhere with behavioral disturbance: Secondary | ICD-10-CM

## 2017-06-22 DIAGNOSIS — E538 Deficiency of other specified B group vitamins: Secondary | ICD-10-CM | POA: Diagnosis not present

## 2017-06-23 ENCOUNTER — Inpatient Hospital Stay: Payer: PPO | Admitting: Family Medicine

## 2017-06-23 DIAGNOSIS — M6281 Muscle weakness (generalized): Secondary | ICD-10-CM | POA: Diagnosis not present

## 2017-06-23 DIAGNOSIS — F331 Major depressive disorder, recurrent, moderate: Secondary | ICD-10-CM | POA: Diagnosis not present

## 2017-06-23 DIAGNOSIS — E441 Mild protein-calorie malnutrition: Secondary | ICD-10-CM | POA: Diagnosis not present

## 2017-07-06 ENCOUNTER — Ambulatory Visit: Payer: PPO

## 2017-07-07 DIAGNOSIS — M13852 Other specified arthritis, left hip: Secondary | ICD-10-CM | POA: Diagnosis not present

## 2017-07-07 DIAGNOSIS — S7002XA Contusion of left hip, initial encounter: Secondary | ICD-10-CM | POA: Diagnosis not present

## 2017-07-26 DIAGNOSIS — Z9181 History of falling: Secondary | ICD-10-CM | POA: Diagnosis not present

## 2017-07-26 DIAGNOSIS — I10 Essential (primary) hypertension: Secondary | ICD-10-CM | POA: Diagnosis not present

## 2017-07-26 DIAGNOSIS — D696 Thrombocytopenia, unspecified: Secondary | ICD-10-CM | POA: Diagnosis not present

## 2017-07-26 DIAGNOSIS — G301 Alzheimer's disease with late onset: Secondary | ICD-10-CM | POA: Diagnosis not present

## 2017-07-26 DIAGNOSIS — M150 Primary generalized (osteo)arthritis: Secondary | ICD-10-CM | POA: Diagnosis not present

## 2017-07-26 DIAGNOSIS — F0281 Dementia in other diseases classified elsewhere with behavioral disturbance: Secondary | ICD-10-CM | POA: Diagnosis not present

## 2017-07-26 DIAGNOSIS — R531 Weakness: Secondary | ICD-10-CM | POA: Diagnosis not present

## 2017-07-26 DIAGNOSIS — F329 Major depressive disorder, single episode, unspecified: Secondary | ICD-10-CM | POA: Diagnosis not present

## 2017-07-26 DIAGNOSIS — Z87891 Personal history of nicotine dependence: Secondary | ICD-10-CM | POA: Diagnosis not present

## 2017-07-27 ENCOUNTER — Telehealth: Payer: Self-pay

## 2017-07-27 NOTE — Telephone Encounter (Signed)
I spoke to Shirley. She was asking for documentation on whether it was actually stopped or not. I advised her we are not the pt's PCP and that Dr Silvio Pate only treated her at [facility]. I told her she would have to contact [facility].

## 2017-07-27 NOTE — Telephone Encounter (Signed)
Margaret at [facility] said she had faxed copy of FL2 signed by Dr Silvio Pate back to [facility] for Dr Alla German review. Joycelyn Schmid said quetiapine came with pts meds but is not on the [facility]. Margaret request cb whether pt should be taking this med or not.

## 2017-07-27 NOTE — Telephone Encounter (Signed)
I think I stopped it while she was at rehab and she seemed to be okay. Try her off it for now. I will not be following her there, so her regular physician will need to decide what to do if she has behavioral issues

## 2017-07-28 ENCOUNTER — Telehealth: Payer: Self-pay

## 2017-07-28 ENCOUNTER — Ambulatory Visit (INDEPENDENT_AMBULATORY_CARE_PROVIDER_SITE_OTHER): Payer: PPO

## 2017-07-28 VITALS — BP 127/77 | HR 83 | Temp 97.7°F | Resp 16 | Ht 60.0 in | Wt 105.0 lb

## 2017-07-28 DIAGNOSIS — Z0000 Encounter for general adult medical examination without abnormal findings: Secondary | ICD-10-CM | POA: Diagnosis not present

## 2017-07-28 NOTE — Telephone Encounter (Signed)
Okay but let her know that I will not be continuing as her doctor now that she has left rehab (I can sign the initial orders though)

## 2017-07-28 NOTE — Progress Notes (Signed)
Subjective:   [name] is a 83 y.o. female who presents for Medicare Annual (Subsequent) preventive examination.  Review of Systems:  Cardiac Risk Factors include: advanced age (>56men, >54 women);hypertension     Objective:     Vitals: BP 127/77 (BP Location: Left Arm, Patient Position: Sitting)   Pulse 83   Temp 97.7 F (36.5 C)   Resp 16   Ht 5' (1.524 m)   Wt 105 lb (47.6 kg)   BMI 20.51 kg/m   Body mass index is 20.51 kg/m.   Tobacco History  Smoking Status  . Former Smoker  . Packs/day: 0.50  . Years: 15.00  . Types: Cigarettes  . Quit date: 12/14/2007  Smokeless Tobacco  . Never Used     Counseling given: Not Answered   Past Medical History:  Diagnosis Date  . Alzheimer's dementia, late onset   . Depression   . Dizziness   . Heart murmur   . Hx of migraine headaches   . Hypertension   . RBBB   . Thrombocytopenia ([place])   . Vitamin D deficiency disease    Past Surgical History:  Procedure Laterality Date  . ABDOMINAL HYSTERECTOMY    . APPENDECTOMY    . CATARACT EXTRACTION, BILATERAL    . CHOLECYSTECTOMY     Family History  Problem Relation Age of Onset  . Arthritis Mother   . Arthritis Father   . Atrial fibrillation Sister    History  Sexual Activity  . Sexual activity: Not on file    Outpatient Encounter Prescriptions as of 07/28/2017  Medication Sig  . acetaminophen (TYLENOL) 325 MG tablet Take 650 mg by mouth every 6 (six) hours as needed.  Marland Kitchen buPROPion (WELLBUTRIN XL) 300 MG 24 hr tablet Take 1 tablet (300 mg total) by mouth daily.  . divalproex (DEPAKOTE) 250 MG DR tablet Take 1 tablet (250 mg total) by mouth 2 (two) times daily.  Marland Kitchen donepezil (ARICEPT) 10 MG tablet Take 1 tablet (10 mg total) by mouth at bedtime.  . mirtazapine (REMERON) 15 MG tablet Take 7.5 mg by mouth at bedtime.    No facility-administered encounter medications on file as of 07/28/2017.     Activities of Daily Living In your present state of health, do  you have any difficulty performing the following activities: 07/28/2017 06/04/2017  Hearing? Y N  Vision? N N  Difficulty concentrating or making decisions? N Y  Walking or climbing stairs? N Y  Dressing or bathing? N Y  Doing errands, shopping? N N  Preparing Food and eating ? N -  Using the Toilet? N -  In the past six months, have you accidently leaked urine? N -  Do you have problems with loss of bowel control? N -  Managing your Medications? N -  Managing your Finances? N -  Housekeeping or managing your Housekeeping? N -  Some recent data might be hidden    Patient Care Team: Valerie Roys, DO as PCP - General (Family Medicine)    Assessment:     Exercise Activities and Dietary recommendations Current Exercise Habits: Home exercise routine, Type of exercise: stretching, Time (Minutes): 30, Frequency (Times/Week): 3, Weekly Exercise (Minutes/Week): 90, Intensity: Mild  Goals    . Increase water intake          Recommend drinking at least 3-4 glassed of water a day       Fall Risk Fall Risk  07/28/2017 06/22/2017 03/07/2017 09/07/2016 05/03/2016  Falls in  the past year? Yes Yes No Yes No  Number falls in past yr: 1 2 or more - 1 -  Injury with Fall? No Yes - No -  Risk Factor Category  - High Fall Risk - - -  Risk for fall due to : - Impaired balance/gait;Impaired mobility - - -  Follow up Falls prevention discussed Falls evaluation completed;Education provided;Falls prevention discussed - Falls evaluation completed -   Depression Screen PHQ 2/9 Scores 07/28/2017 04/07/2016 03/10/2016 02/16/2016  PHQ - 2 Score 2 3 5 3   PHQ- 9 Score 9 12 10 8      Cognitive Function MMSE - Mini Mental State Exam 03/07/2017  Not completed: Refused   Montreal Cognitive Assessment  05/03/2016  Visuospatial/ Executive (0/5) 2  Naming (0/3) 2  Attention: Read list of digits (0/2) 2  Attention: Read list of letters (0/1) 1  Attention: Serial 7 subtraction starting at 100 (0/3) 1  Language:  Repeat phrase (0/2) 2  Language : Fluency (0/1) 0  Abstraction (0/2) 2  Delayed Recall (0/5) 0  Orientation (0/6) 3  Total 15  Adjusted Score (based on education) 15   6CIT Screen 07/28/2017  What Year? 4 points  What month? 3 points  What time? 0 points  Count back from 20 0 points  Months in reverse 4 points  Repeat phrase 10 points  Total Score 21    Immunization History  Administered Date(s) Administered  . Influenza, High Dose Seasonal PF 09/13/2016  . Influenza,inj,Quad PF,36+ Mos 02/05/2016  . Pneumococcal Polysaccharide-23 02/05/2016  . Tdap 12/14/2011, 03/12/2017   Screening Tests Health Maintenance  Topic Date Due  . INFLUENZA VACCINE  07/13/2017  . TETANUS/TDAP  03/13/2027  . DEXA SCAN  Completed  . PNA vac Low Risk Adult  Completed      Plan:     I have personally reviewed and addressed the Medicare Annual Wellness questionnaire and have noted the following in the patient's chart:  A. Medical and social history B. Use of alcohol, tobacco or illicit drugs  C. Current medications and supplements D. Functional ability and status E.  Nutritional status F.  Physical activity G. Advance directives H. List of other physicians I.  Hospitalizations, surgeries, and ER visits in previous 12 months J.  [place] such as hearing and vision if needed, cognitive and depression L. Referrals and appointments  In addition, I have reviewed and discussed with patient certain preventive protocols, quality metrics, and best practice recommendations. A written personalized care plan for preventive services as well as general preventive health recommendations were provided to patient.   Signed,  Tyler Aas, LPN Nurse Health Advisor   MD Recommendations:none

## 2017-07-28 NOTE — Patient Instructions (Addendum)
[name] , Thank you for taking time to come for your Medicare Wellness Visit. I appreciate your ongoing commitment to your health goals. Please review the following plan we discussed and let me know if I can assist you in the future.   Screening recommendations/referrals: Colonoscopy: completed 12/13/2010, no longer required Mammogram: no longer required Bone Density: completed 12/13/2012 Recommended yearly ophthalmology/optometry visit for glaucoma screening and checkup Recommended yearly dental visit for hygiene and checkup  Vaccinations: Influenza vaccine: up to date, due 09/2017 Pneumococcal vaccine: up to date  Tdap vaccine: up to date Shingles vaccine: due, check with your insurance company for coverage    Advanced directives: A copy is on file- you have requested no changes that need to be made at this time.   Conditions/risks identified: Recommend drinking at least 3-4 glassed of water a day   Next appointment: Follow up in on year for your annual wellness exam.   Preventive Care 81 Years and Older, Female Preventive care refers to lifestyle choices and visits with your health care provider that can promote health and wellness. What does preventive care include?  A yearly physical exam. This is also called an annual well check.  Dental exams once or twice a year.  Routine eye exams. Ask your health care provider how often you should have your eyes checked.  Personal lifestyle choices, including:  Daily care of your teeth and gums.  Regular physical activity.  Eating a healthy diet.  Avoiding tobacco and drug use.  Limiting alcohol use.  Practicing safe sex.  Taking low-dose aspirin every day.  Taking vitamin and mineral supplements as recommended by your health care provider. What happens during an annual well check? The services and screenings done by your health care provider during your annual well check will depend on your age, overall health, lifestyle  risk factors, and family history of disease. Counseling  Your health care provider may ask you questions about your:  Alcohol use.  Tobacco use.  Drug use.  Emotional well-being.  Home and relationship well-being.  Sexual activity.  Eating habits.  History of falls.  Memory and ability to understand (cognition).  Work and work Statistician.  Reproductive health. Screening  You may have the following tests or measurements:  Height, weight, and BMI.  Blood pressure.  Lipid and cholesterol levels. These may be checked every 5 years, or more frequently if you are over 81 years old.  Skin check.  Lung cancer screening. You may have this screening every year starting at age 81 if you have a 30-pack-year history of smoking and currently smoke or have quit within the past 15 years.  Fecal occult blood test (FOBT) of the stool. You may have this test every year starting at age 81.  Flexible sigmoidoscopy or colonoscopy. You may have a sigmoidoscopy every 5 years or a colonoscopy every 10 years starting at age 81.  Hepatitis C blood test.  Hepatitis B blood test.  Sexually transmitted disease (STD) testing.  Diabetes screening. This is done by checking your blood sugar (glucose) after you have not eaten for a while (fasting). You may have this done every 1-3 years.  Bone density scan. This is done to screen for osteoporosis. You may have this done starting at age 81.  Mammogram. This may be done every 1-2 years. Talk to your health care provider about how often you should have regular mammograms. Talk with your health care provider about your test results, treatment options, and if necessary,  the need for more tests. Vaccines  Your health care provider may recommend certain vaccines, such as:  Influenza vaccine. This is recommended every year.  Tetanus, diphtheria, and acellular pertussis (Tdap, Td) vaccine. You may need a Td booster every 10 years.  Zoster vaccine.  You may need this after age 81.  Pneumococcal 13-valent conjugate (PCV13) vaccine. One dose is recommended after age 81.  Pneumococcal polysaccharide (PPSV23) vaccine. One dose is recommended after age 81. Talk to your health care provider about which screenings and vaccines you need and how often you need them. This information is not intended to replace advice given to you by your health care provider. Make sure you discuss any questions you have with your health care provider. Document Released: 12/26/2015 Document Revised: 08/18/2016 Document Reviewed: 09/30/2015 Elsevier Interactive Patient Education  2017 [place] Prevention in the Home Falls can cause injuries. They can happen to people of all ages. There are many things you can do to make your home safe and to help prevent falls. What can I do on the outside of my home?  Regularly fix the edges of walkways and driveways and fix any cracks.  Remove anything that might make you trip as you walk through a door, such as a raised step or threshold.  Trim any bushes or trees on the path to your home.  Use bright outdoor lighting.  Clear any walking paths of anything that might make someone trip, such as rocks or tools.  Regularly check to see if handrails are loose or broken. Make sure that both sides of any steps have handrails.  Any raised decks and porches should have guardrails on the edges.  Have any leaves, snow, or ice cleared regularly.  Use sand or salt on walking paths during winter.  Clean up any spills in your garage right away. This includes oil or grease spills. What can I do in the bathroom?  Use night lights.  Install grab bars by the toilet and in the tub and shower. Do not use towel bars as grab bars.  Use non-skid mats or decals in the tub or shower.  If you need to sit down in the shower, use a plastic, non-slip stool.  Keep the floor dry. Clean up any water that spills on the floor as soon  as it happens.  Remove soap buildup in the tub or shower regularly.  Attach bath mats securely with double-sided non-slip rug tape.  Do not have throw rugs and other things on the floor that can make you trip. What can I do in the bedroom?  Use night lights.  Make sure that you have a light by your bed that is easy to reach.  Do not use any sheets or blankets that are too big for your bed. They should not hang down onto the floor.  Have a firm chair that has side arms. You can use this for support while you get dressed.  Do not have throw rugs and other things on the floor that can make you trip. What can I do in the kitchen?  Clean up any spills right away.  Avoid walking on wet floors.  Keep items that you use a lot in easy-to-reach places.  If you need to reach something above you, use a strong step stool that has a grab bar.  Keep electrical cords out of the way.  Do not use floor polish or wax that makes floors slippery. If you must use  wax, use non-skid floor wax.  Do not have throw rugs and other things on the floor that can make you trip. What can I do with my stairs?  Do not leave any items on the stairs.  Make sure that there are handrails on both sides of the stairs and use them. Fix handrails that are broken or loose. Make sure that handrails are as long as the stairways.  Check any carpeting to make sure that it is firmly attached to the stairs. Fix any carpet that is loose or worn.  Avoid having throw rugs at the top or bottom of the stairs. If you do have throw rugs, attach them to the floor with carpet tape.  Make sure that you have a light switch at the top of the stairs and the bottom of the stairs. If you do not have them, ask someone to add them for you. What else can I do to help prevent falls?  Wear shoes that:  Do not have high heels.  Have rubber bottoms.  Are comfortable and fit you well.  Are closed at the toe. Do not wear sandals.  If  you use a stepladder:  Make sure that it is fully opened. Do not climb a closed stepladder.  Make sure that both sides of the stepladder are locked into place.  Ask someone to hold it for you, if possible.  Clearly mark and make sure that you can see:  Any grab bars or handrails.  First and last steps.  Where the edge of each step is.  Use tools that help you move around (mobility aids) if they are needed. These include:  Canes.  Walkers.  Scooters.  Crutches.  Turn on the lights when you go into a dark area. Replace any light bulbs as soon as they burn out.  Set up your furniture so you have a clear path. Avoid moving your furniture around.  If any of your floors are uneven, fix them.  If there are any pets around you, be aware of where they are.  Review your medicines with your doctor. Some medicines can make you feel dizzy. This can increase your chance of falling. Ask your doctor what other things that you can do to help prevent falls. This information is not intended to replace advice given to you by your health care provider. Make sure you discuss any questions you have with your health care provider. Document Released: 09/25/2009 Document Revised: 05/06/2016 Document Reviewed: 01/03/2015 Elsevier Interactive Patient Education  2017 [employer].

## 2017-07-28 NOTE — Telephone Encounter (Signed)
[name] with [facility] left v/m requesting verbal orders for [facility] PT 2 x a week for 6 weeks.

## 2017-07-29 DIAGNOSIS — G301 Alzheimer's disease with late onset: Secondary | ICD-10-CM | POA: Diagnosis not present

## 2017-07-29 DIAGNOSIS — E538 Deficiency of other specified B group vitamins: Secondary | ICD-10-CM | POA: Diagnosis not present

## 2017-07-29 DIAGNOSIS — E559 Vitamin D deficiency, unspecified: Secondary | ICD-10-CM | POA: Diagnosis not present

## 2017-07-29 DIAGNOSIS — I10 Essential (primary) hypertension: Secondary | ICD-10-CM | POA: Diagnosis not present

## 2017-07-29 NOTE — Telephone Encounter (Signed)
Spoke to [place]. She will get with the PCP

## 2017-08-02 ENCOUNTER — Telehealth: Payer: Self-pay | Admitting: Family Medicine

## 2017-08-02 NOTE — Telephone Encounter (Signed)
Called and let Mickel Baas know that Dr. Wynetta Emery said OK for verbal orders.

## 2017-08-02 NOTE — Telephone Encounter (Signed)
Routing to provider  

## 2017-08-02 NOTE — Telephone Encounter (Signed)
Mickel Baas with [facility] called and would like to get orders for PT 2 times a week for 6 weeks. She Mickel Baas) stated that it is ok to leave the orders on VM if she doesn't answer.

## 2017-08-02 NOTE — Telephone Encounter (Signed)
This sounds great

## 2017-08-25 IMAGING — MR MR HEAD W/O CM
10 series · 48 of 48 positions shown · non-contrast
Comparison: CT head 06/04/2017.

CLINICAL DATA: Altered mental status, weakness. Dementia. Evaluate
for acute stroke. Ataxia.

EXAM:
MRI HEAD WITHOUT CONTRAST
TECHNIQUE: Multiplanar, multiecho pulse sequences of the brain and surrounding
structures were obtained without intravenous contrast.

[Series 2: T1 · sagittal · 5.0mm · 0.45mm/px · 3 of 23 slices shown (1 of 2)]
[im 1/23]
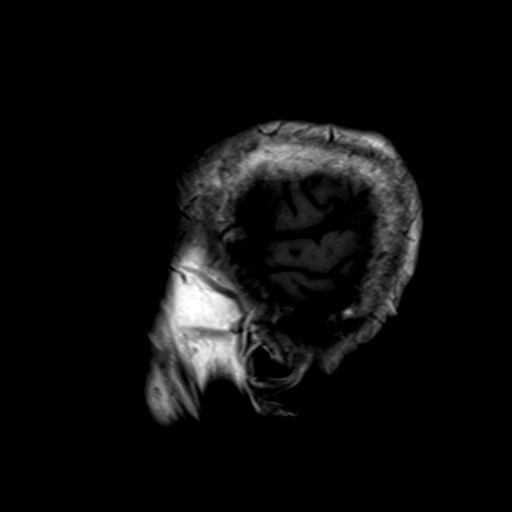
[im 12/23]
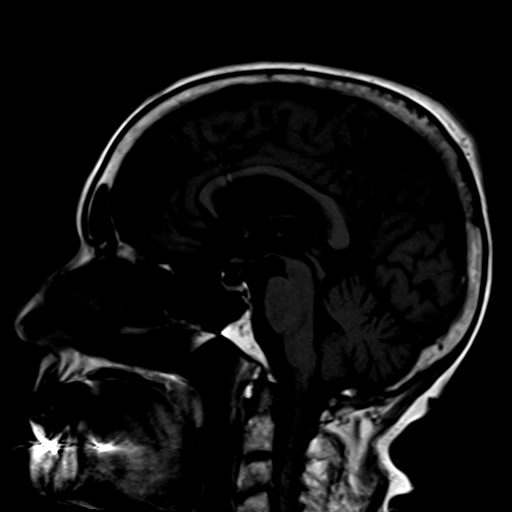
[im 23/23]
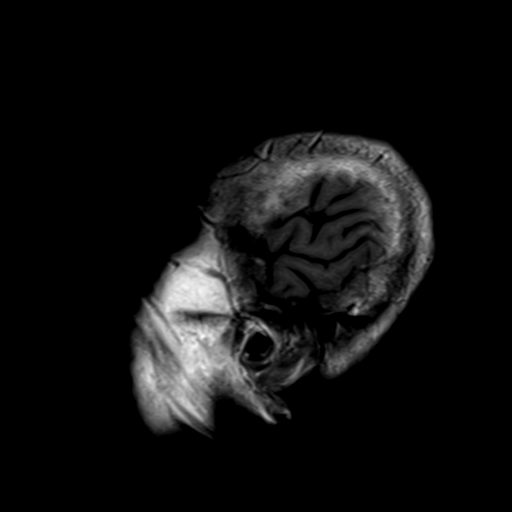

[Series 4: DWI · axial · 3.0mm · 1.80mm/px · z∈[-91,+65]mm · 5 of 55 slices shown (1 of 2)]
[im 1/55]
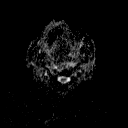
[im 14/55]
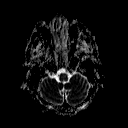
[im 28/55]
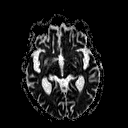
[im 41/55]
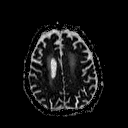
[im 55/55]
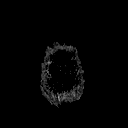

[Series 6: DWI · coronal · 3.0mm · 1.80mm/px · 4 of 45 slices shown (2 of 2)]
[im 1/45]
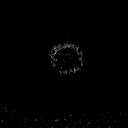
[im 15/45]
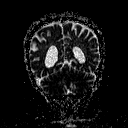
[im 30/45]
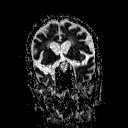
[im 45/45]
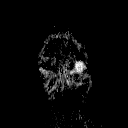

[Series 7: T2 · axial · 5.0mm · 0.60mm/px · z∈[-76,+63]mm · 2 of 23 slices shown (1 of 3)]
[im 1/23]
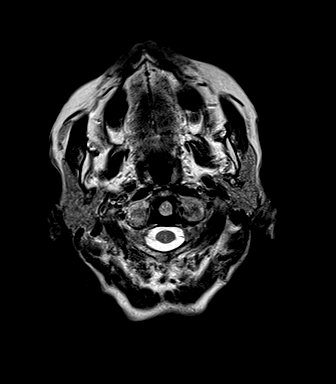
[im 23/23]
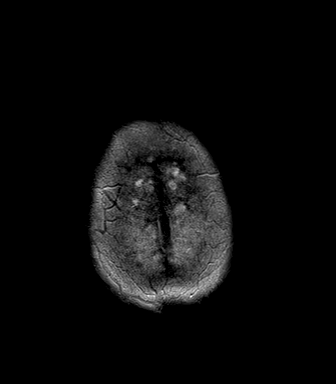

[Series 8: FLAIR · axial · 3.0mm · 0.45mm/px · z∈[-82,+69]mm · 5 of 53 slices shown]
[im 1/53]
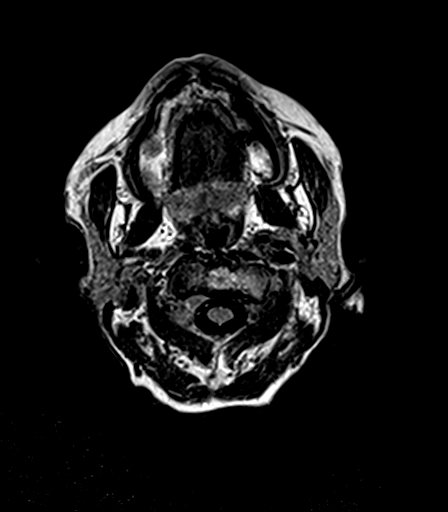
[im 14/53]
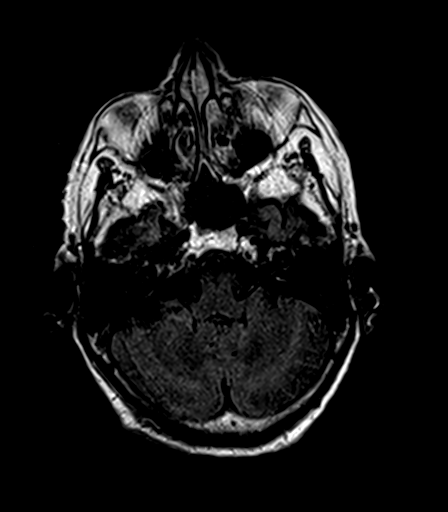
[im 27/53]
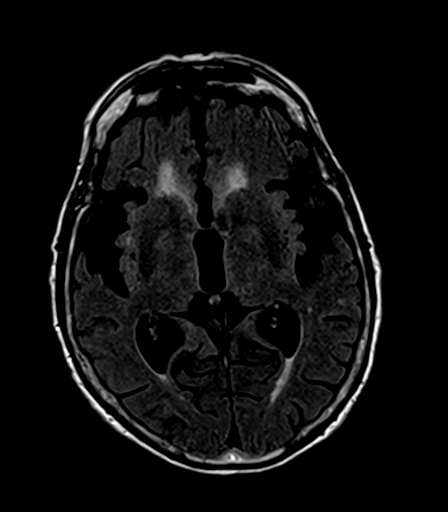
[im 40/53]
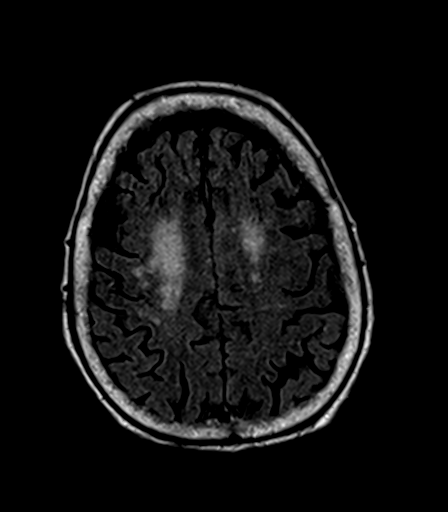
[im 53/53]
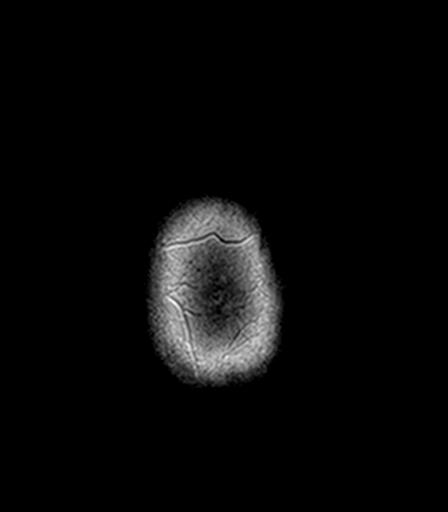

[Series 9: T2 · axial · 5.0mm · 0.45mm/px · z∈[-76,+63]mm · 2 of 23 slices shown (2 of 3)]
[im 1/23]
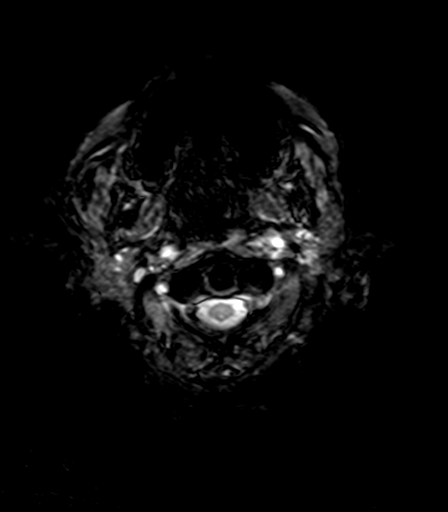
[im 23/23]
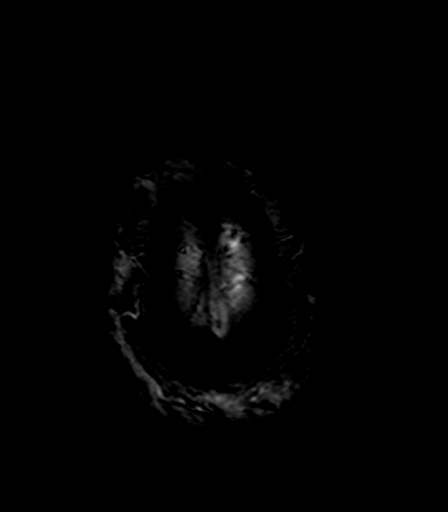

[Series 10: T1 · axial · 1.0mm · 1.00mm/px · z∈[-88,+81]mm · 16 of 176 slices shown (2 of 2)]
[im 1/176]
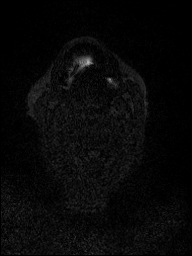
[im 12/176]
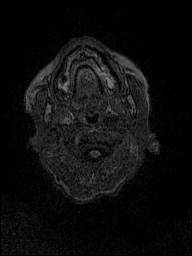
[im 24/176]
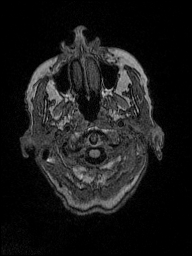
[im 36/176]
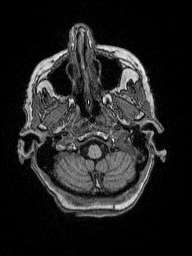
[im 47/176]
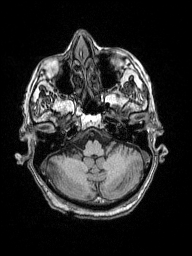
[im 59/176]
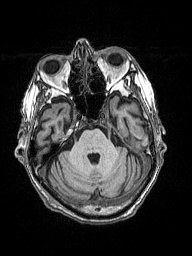
[im 71/176]
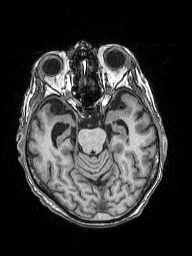
[im 82/176]
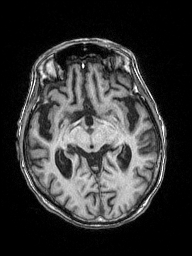
[im 94/176]
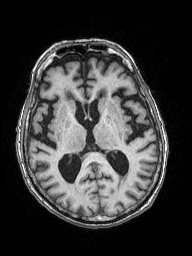
[im 106/176]
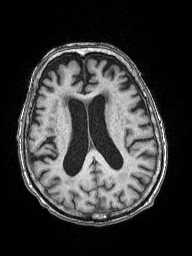
[im 117/176]
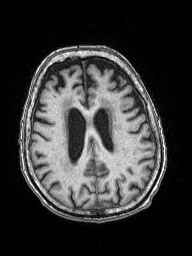
[im 129/176]
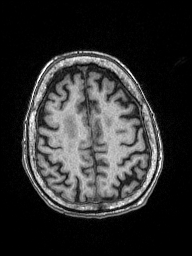
[im 141/176]
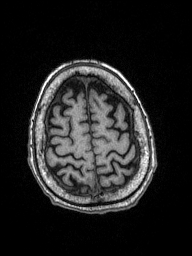
[im 152/176]
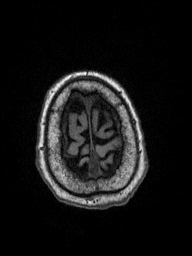
[im 164/176]
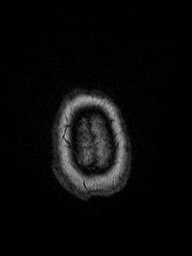
[im 176/176]
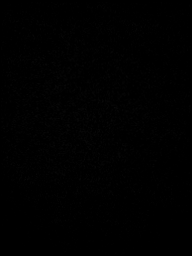

[Series 11: T2 · coronal · 5.0mm · 0.49mm/px · 2 of 27 slices shown (3 of 3)]
[im 1/27]
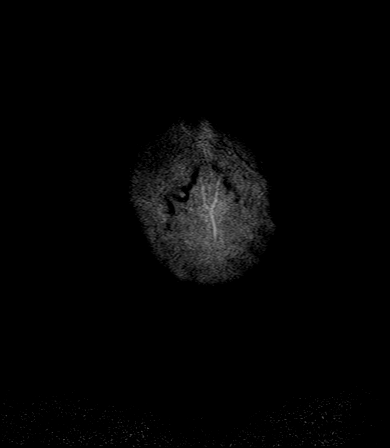
[im 27/27]
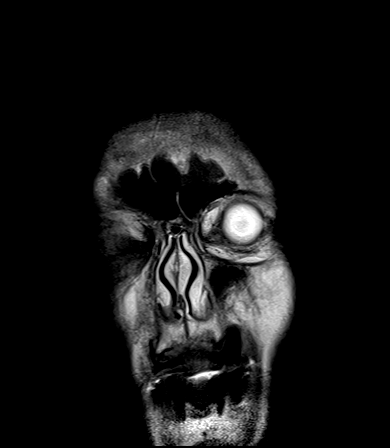

[Series 100: (id) ax · axial · 3.0mm · 1.80mm/px · z∈[-91,+65]mm · 5 of 52 slices shown]
[im 1/52]
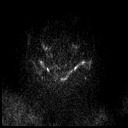
[im 13/52]
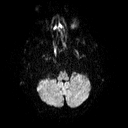
[im 26/52]
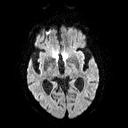
[im 39/52]
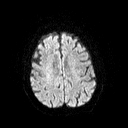
[im 52/52]
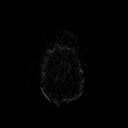

[Series 101: (id) cor · coronal · 3.0mm · 1.80mm/px · 4 of 45 slices shown]
[im 1/45]
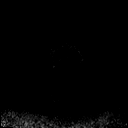
[im 15/45]
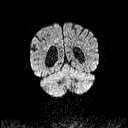
[im 30/45]
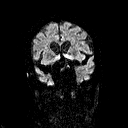
[im 45/45]
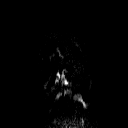

[48 of 48 positions shown; findings below may reference images not displayed]

FINDINGS: Brain: No evidence for acute infarction, hemorrhage, mass lesion, or
extra-axial fluid. Severe atrophy. Hydrocephalus ex vacuo. Extensive
white matter disease.

Vascular: Normal flow voids.

Skull and upper cervical spine: Normal marrow signal.

Sinuses/Orbits: Negative.  BILATERAL cataract extraction.

Other: None.
IMPRESSION: Severe chronic changes of atrophy and small vessel disease, as
described, correlate with dementia.

No acute stroke or other significant finding.

## 2017-08-26 DIAGNOSIS — G301 Alzheimer's disease with late onset: Secondary | ICD-10-CM | POA: Diagnosis not present

## 2017-08-26 DIAGNOSIS — I10 Essential (primary) hypertension: Secondary | ICD-10-CM | POA: Diagnosis not present

## 2017-08-26 DIAGNOSIS — E559 Vitamin D deficiency, unspecified: Secondary | ICD-10-CM | POA: Diagnosis not present

## 2017-08-26 DIAGNOSIS — E538 Deficiency of other specified B group vitamins: Secondary | ICD-10-CM | POA: Diagnosis not present

## 2017-09-01 DIAGNOSIS — E559 Vitamin D deficiency, unspecified: Secondary | ICD-10-CM | POA: Diagnosis not present

## 2017-09-01 DIAGNOSIS — E782 Mixed hyperlipidemia: Secondary | ICD-10-CM | POA: Diagnosis not present

## 2017-09-01 DIAGNOSIS — E038 Other specified hypothyroidism: Secondary | ICD-10-CM | POA: Diagnosis not present

## 2017-09-01 DIAGNOSIS — E119 Type 2 diabetes mellitus without complications: Secondary | ICD-10-CM | POA: Diagnosis not present

## 2017-09-01 DIAGNOSIS — D518 Other vitamin B12 deficiency anemias: Secondary | ICD-10-CM | POA: Diagnosis not present

## 2017-09-01 DIAGNOSIS — Z79899 Other long term (current) drug therapy: Secondary | ICD-10-CM | POA: Diagnosis not present

## 2017-09-08 ENCOUNTER — Telehealth: Payer: Self-pay

## 2017-09-08 NOTE — Telephone Encounter (Addendum)
Spoke to [place]. Gave verbal orders

## 2017-09-08 NOTE — Telephone Encounter (Signed)
That is okay.

## 2017-09-08 NOTE — Telephone Encounter (Signed)
Mickel Baas at [facility] left v/m requesting verbal orders to continue [facility] PT 2 x a week for 2 weeks; in 2 weeks pt will be discharged from PT [facility].

## 2017-09-14 ENCOUNTER — Ambulatory Visit: Payer: PPO | Admitting: Neurology

## 2017-09-23 DIAGNOSIS — I10 Essential (primary) hypertension: Secondary | ICD-10-CM | POA: Diagnosis not present

## 2017-09-23 DIAGNOSIS — E559 Vitamin D deficiency, unspecified: Secondary | ICD-10-CM | POA: Diagnosis not present

## 2017-09-23 DIAGNOSIS — E538 Deficiency of other specified B group vitamins: Secondary | ICD-10-CM | POA: Diagnosis not present

## 2017-09-23 DIAGNOSIS — G301 Alzheimer's disease with late onset: Secondary | ICD-10-CM | POA: Diagnosis not present

## 2017-10-04 ENCOUNTER — Emergency Department
Admission: EM | Admit: 2017-10-04 | Discharge: 2017-10-04 | Disposition: A | Discharge status: Home / Self Care | Payer: PPO | Attending: Emergency Medicine | Admitting: Emergency Medicine

## 2017-10-04 ENCOUNTER — Encounter: Payer: Self-pay | Admitting: Emergency Medicine

## 2017-10-04 ENCOUNTER — Emergency Department: Payer: PPO

## 2017-10-04 DIAGNOSIS — R42 Dizziness and giddiness: Secondary | ICD-10-CM | POA: Diagnosis not present

## 2017-10-04 DIAGNOSIS — F0281 Dementia in other diseases classified elsewhere with behavioral disturbance: Secondary | ICD-10-CM | POA: Diagnosis not present

## 2017-10-04 DIAGNOSIS — G301 Alzheimer's disease with late onset: Secondary | ICD-10-CM | POA: Insufficient documentation

## 2017-10-04 DIAGNOSIS — N3000 Acute cystitis without hematuria: Secondary | ICD-10-CM | POA: Insufficient documentation

## 2017-10-04 DIAGNOSIS — I10 Essential (primary) hypertension: Secondary | ICD-10-CM | POA: Insufficient documentation

## 2017-10-04 DIAGNOSIS — F17210 Nicotine dependence, cigarettes, uncomplicated: Secondary | ICD-10-CM | POA: Insufficient documentation

## 2017-10-04 DIAGNOSIS — R9431 Abnormal electrocardiogram [ECG] [EKG]: Secondary | ICD-10-CM | POA: Diagnosis not present

## 2017-10-04 LAB — CBC
HEMATOCRIT: 38.1 % (ref 35.0–47.0)
Hemoglobin: 13 g/dL (ref 12.0–16.0)
MCH: 31.3 pg (ref 26.0–34.0)
MCHC: 34 g/dL (ref 32.0–36.0)
MCV: 92.2 fL (ref 80.0–100.0)
Platelets: 142 10*3/uL — ABNORMAL LOW (ref 150–440)
RBC: 4.13 MIL/uL (ref 3.80–5.20)
RDW: 13.8 % (ref 11.5–14.5)
WBC: 6.6 10*3/uL (ref 3.6–11.0)

## 2017-10-04 LAB — URINALYSIS, COMPLETE (UACMP) WITH MICROSCOPIC
Bilirubin Urine: NEGATIVE
Glucose, UA: NEGATIVE mg/dL
Hgb urine dipstick: NEGATIVE
KETONES UR: NEGATIVE mg/dL
Nitrite: NEGATIVE
PH: 7 (ref 5.0–8.0)
PROTEIN: NEGATIVE mg/dL
RBC / HPF: NONE SEEN RBC/hpf (ref 0–5)
Specific Gravity, Urine: 1.006 (ref 1.005–1.030)

## 2017-10-04 LAB — BASIC METABOLIC PANEL
Anion gap: 9 (ref 5–15)
BUN: 22 mg/dL — ABNORMAL HIGH (ref 6–20)
CHLORIDE: 106 mmol/L (ref 101–111)
CO2: 26 mmol/L (ref 22–32)
Calcium: 9.1 mg/dL (ref 8.9–10.3)
Creatinine, Ser: 0.59 mg/dL (ref 0.44–1.00)
GFR calc Af Amer: >60 mL/min (ref >60)
GFR calc non Af Amer: >60 mL/min (ref >60)
GLUCOSE: 134 mg/dL — AB (ref 65–99)
POTASSIUM: 4.2 mmol/L (ref 3.5–5.1)
SODIUM: 141 mmol/L (ref 135–145)

## 2017-10-04 LAB — TROPONIN I: Troponin I: <0.03 ng/mL (ref <0.03)

## 2017-10-04 MED ORDER — CEPHALEXIN 500 MG PO CAPS: 500.0000 mg | ORAL_CAPSULE | Freq: Four times a day (QID) | ORAL | 0 refills | Status: AC

## 2017-10-04 MED ORDER — CEPHALEXIN 500 MG PO CAPS
500.0000 mg | ORAL_CAPSULE | Freq: Once | ORAL | Status: AC
  Administered 2017-10-04: 500 mg via ORAL
  Filled 2017-10-04: qty 1

## 2017-10-04 NOTE — Discharge Instructions (Signed)
Please check [name] blood pressure 3 times daily for the next 2 days with an appropriate-sized cuff to ensure that she has normal blood pressures.  Please schedule a follow-up appointment for [name] with her primary care physician in the next 1-2 days.  Take the entire course of antibiotics, even if you're feeling better. Please have your primary care physician follow-up your urine culture which was performed from the emergency department sample.  Return to the emergency department for hypotension, prolonged dizziness, visual changes or speech changes, mental status changes, fever, nausea vomiting or diarrhea, or any other symptoms concerning to you.

## 2017-10-04 NOTE — ED Notes (Signed)
Fall bell placed on pt. Pt in hall bed visible from nursing desk.

## 2017-10-04 NOTE — ED Provider Notes (Signed)
[facility] Emergency Department Provider Note  ____________________________________________  Time seen: Approximately 3:13 PM  I have reviewed the triage vital signs and the nursing notes.   HISTORY  Chief Complaint Fall  The patient's history is limited due to her dementia.  HPI NAYELIZ HIPP is a 83 y.o. female with a history of severe dementia, chronic dizziness, HTN, presenting for dizziness.The patient is unable to give any history, and thinks that she just went to a political lunch. The report from EMS is that the patient has chronic intermittent dizzy spells, however, she was sent to the emergency department today for dizziness that has lasted all day, with a recorded blood pressure of 70/40 at [facility] where she lives. The paramedics repeated her blood pressure and it was normal, and on arrival to the emergency department, the patient's blood pressure is 139/68. The patient has not had any falls, denies headache, chest pain, lightheadedness, shortness of breath or any recent illness. She has no pain at this time.   Past Medical History:  Diagnosis Date  . Alzheimer's dementia, late onset   . Depression   . Dizziness   . Heart murmur   . Hx of migraine headaches   . Hypertension   . RBBB   . Thrombocytopenia ([place])   . Vitamin D deficiency disease     Patient Active Problem List   Diagnosis Date Noted  . Altered mental status 06/07/2017  . Hyperglycemia 06/07/2017  . Dementia with behavioral disturbance 06/07/2017  . Weakness 06/04/2017  . Late onset Alzheimer's disease without behavioral disturbance 09/07/2016  . B12 deficiency 02/07/2016  . Major depressive disorder, recurrent, moderate ([place]) 02/05/2016  . Vitamin D deficiency disease   . RBBB   . Thrombocytopenia ([place])   . Heart murmur   . Essential hypertension 09/05/2014    Past Surgical History:  Procedure Laterality Date  . ABDOMINAL HYSTERECTOMY    . APPENDECTOMY    .  CATARACT EXTRACTION, BILATERAL    . CHOLECYSTECTOMY      Current Outpatient Rx  . Order #: [id number] Class: Normal  . Order #: [id number] Class: Print  . Order #: [id number] Class: Normal  . Order #: [id number] Class: Historical Med  . Order #: [id number] Class: Historical Med  . Order #: [id number] Class: Historical Med  . Order #: [id number] Class: Historical Med  . Order #: [id number] Class: Print    Allergies Patient has no known allergies.  Family History  Problem Relation Age of Onset  . Arthritis Mother   . Arthritis Father   . Atrial fibrillation Sister     Social History Social History  Substance Use Topics  . Smoking status: Former Smoker    Packs/day: 0.50    Years: 15.00    Types: Cigarettes    Quit date: 12/14/2007  . Smokeless tobacco: Never Used  . Alcohol use No    Review of Systems Constitutional: No fever/chills. No lightheadedness or syncope. Positive chronic dizziness. Eyes: No visual changes. No blurred or double vision. ENT: No sore throat. No congestion or rhinorrhea. Cardiovascular: Denies chest pain. Denies palpitations. Respiratory: Denies shortness of breath.  No cough. Gastrointestinal: No abdominal pain.  No nausea, no vomiting.  No diarrhea.  No constipation. Genitourinary: Negative for dysuria. Musculoskeletal: Negative for back pain. No neck pain. Skin: Negative for rash. Neurological: Negative for headaches. No focal numbness, tingling or weakness.     ____________________________________________   PHYSICAL EXAM:  VITAL SIGNS: ED Triage Vitals [10/04/17 1430]  Enc Vitals Group  BP 139/68     Pulse Rate 86     Resp 16     Temp 99.1 F (37.3 C)     Temp Source Oral     SpO2 96 %     Weight      Height      Head Circumference      Peak Flow      Pain Score      Pain Loc      Pain Edu?      Excl. in [place]?     Constitutional: Alert and oriented. Well appearing and in no acute distress. Answers questions appropriately. Eyes:  Conjunctivae are normal.  EOMI. No scleral icterus. Head: Atraumatic. Nose: No congestion/rhinnorhea. Mouth/Throat: Mucous membranes are moist.  Neck: No stridor.  Supple.   Cardiovascular: Normal rate, regular rhythm. No murmurs, rubs or gallops.  Respiratory: Normal respiratory effort.  No accessory muscle use or retractions. Lungs CTAB.  No wheezes, rales or ronchi. Gastrointestinal: Soft, nontender and nondistended.  No guarding or rebound.  No peritoneal signs. Musculoskeletal: No LE edema. No ttp in the calves or palpable cords.  Negative Homan's sign. No midline C, T or L-spine tenderness to palpation area pelvis is stable. Full range of motion of the bilateral shoulders, elbows, wrists, hips, knees, and ankles without pain. Neurologic:  Alert but not oriented.  Speech is clear.  Face and smile are symmetric.  EOMI.  Moves all extremities well. Normal gait without ataxia. Skin:  Skin is warm, dry and intact. No rash noted. Psychiatric: Mood and affect are normal. Speech and behavior are normal.  Normal judgement.  ____________________________________________   LABS (all labs ordered are listed, but only abnormal results are displayed)  Labs Reviewed  BASIC METABOLIC PANEL - Abnormal; Notable for the following:       Result Value   Glucose, Bld 134 (*)    BUN 22 (*)    All other components within normal limits  CBC - Abnormal; Notable for the following:    Platelets 142 (*)    All other components within normal limits  URINALYSIS, COMPLETE (UACMP) WITH MICROSCOPIC - Abnormal; Notable for the following:    Color, Urine STRAW (*)    APPearance CLEAR (*)    Leukocytes, UA MODERATE (*)    Bacteria, UA RARE (*)    Squamous Epithelial / LPF 0-5 (*)    All other components within normal limits  URINE CULTURE  TROPONIN I   ____________________________________________  EKG  ED ECG REPORT I, Eula Listen, the attending physician, personally viewed and interpreted this  ECG.   Date: 10/04/2017  EKG Time: 1430  Rate: 82  Rhythm: normal sinus rhythm; RBBB  Axis: leftward  Intervals:none  ST&T Change: NO STEMI  ____________________________________________  RADIOLOGY  Ct Head Wo Contrast  Result Date: 10/04/2017 CLINICAL DATA:  Head trauma. Recent unwitnessed fall. Dizziness. Hypotension. EXAM: CT HEAD WITHOUT CONTRAST TECHNIQUE: Contiguous axial images were obtained from the base of the skull through the vertex without intravenous contrast. COMPARISON:  Brain MRI 06/05/2017 FINDINGS: Brain: Moderate cerebral atrophy is unchanged and is most notable in the temporal lobes, particularly on the right. Periventricular white matter hypodensities are nonspecific but compatible with moderate chronic small vessel ischemic disease. There is no evidence of acute infarct, intracranial hemorrhage, mass, midline shift, or extra-axial fluid collection. Vascular: Mild calcified atherosclerosis at the skullbase. No hyperdense vessel. Skull: No fracture or focal osseous lesion. Sinuses/Orbits: Visualized paranasal sinuses are clear. Prior mastoidectomy on the  left. Unremarkable orbits. Other: None. IMPRESSION: 1. No evidence of acute intracranial abnormality. 2. Moderate chronic small vessel ischemic disease and cerebral atrophy. Electronically Signed   By: Logan Bores M.D.   On: 10/04/2017 15:42    ____________________________________________   PROCEDURES  Procedure(s) performed: None  Procedures  Critical Care performed: No ____________________________________________   INITIAL IMPRESSION / ASSESSMENT AND PLAN / ED COURSE  Pertinent labs & imaging results that were available during my care of the patient were reviewed by me and considered in my medical decision making (see chart for details).  84 y.o. female with a history of chronic intermittent dizziness presenting for prolonged dizziness in the setting of a hypotensive blood pressure reading at her nursing  facility. Here, the patient has normal vital signs and a very reassuring exam with no acute cardiopulmonary findings, nor any abnormal neurologic findings. The patient's EKG shows a right bundle-branch block but has no ischemic changes or evidence of arrhythmia. We will get basic labs to rule out anemia, electrolyte imbalance, and evaluate her troponin. She will have a CT of the head, and we will rule out UTI. Anticipate that the patient will be able to be discharged home with close PMD follow-up.  ----------------------------------------- 3:47 PM on 10/04/2017 -----------------------------------------  The patient's workup in the emergency department has been reassuring. She has remained hemodynamically stable, and has been able tablet without any difficulty. Her CT scan does not show any acute intracranial process, and her laboratory studies are reassuring. She does have a urinary tract infection, subsequent her urine for culture and initiated her on Keflex in the emergency department. She will go home with a 5 day course of Keflex and close PMD follow-up. Return precautions were typed into her discharge paperwork. ____________________________________________  FINAL CLINICAL IMPRESSION(S) / ED DIAGNOSES  Final diagnoses:  Dizziness  Acute cystitis without hematuria         NEW MEDICATIONS STARTED DURING THIS VISIT:  New Prescriptions   CEPHALEXIN (KEFLEX) 500 MG CAPSULE    Take 1 capsule (500 mg total) by mouth 4 (four) times daily.      Eula Listen, MD 10/04/17 [phone]

## 2017-10-04 NOTE — ED Triage Notes (Signed)
Pt sent from [facility] memory care for dizziness.  Has intermittent dizzy spells at baseline but today has lasted all day. [facility] told [facility] that pressure was 70/40 for them.  EMS got 419 systolic for BP.  Did have unwitnessed fall Friday.  This RN called [facility] they report sent because dizziness has been all day and not went away.

## 2017-10-04 NOTE — ED Notes (Signed)
Patient transported to CT 

## 2017-10-06 LAB — URINE CULTURE

## 2017-10-07 DIAGNOSIS — F0281 Dementia in other diseases classified elsewhere with behavioral disturbance: Secondary | ICD-10-CM | POA: Diagnosis not present

## 2017-10-07 DIAGNOSIS — N390 Urinary tract infection, site not specified: Secondary | ICD-10-CM | POA: Diagnosis not present

## 2017-10-07 DIAGNOSIS — G301 Alzheimer's disease with late onset: Secondary | ICD-10-CM | POA: Diagnosis not present

## 2017-10-11 DIAGNOSIS — L84 Corns and callosities: Secondary | ICD-10-CM | POA: Diagnosis not present

## 2017-10-11 DIAGNOSIS — I739 Peripheral vascular disease, unspecified: Secondary | ICD-10-CM | POA: Diagnosis not present

## 2017-10-11 DIAGNOSIS — M2042 Other hammer toe(s) (acquired), left foot: Secondary | ICD-10-CM | POA: Diagnosis not present

## 2017-10-11 DIAGNOSIS — B351 Tinea unguium: Secondary | ICD-10-CM | POA: Diagnosis not present

## 2017-11-04 DIAGNOSIS — E559 Vitamin D deficiency, unspecified: Secondary | ICD-10-CM | POA: Diagnosis not present

## 2017-11-04 DIAGNOSIS — E538 Deficiency of other specified B group vitamins: Secondary | ICD-10-CM | POA: Diagnosis not present

## 2017-11-04 DIAGNOSIS — I10 Essential (primary) hypertension: Secondary | ICD-10-CM | POA: Diagnosis not present

## 2017-11-04 DIAGNOSIS — G301 Alzheimer's disease with late onset: Secondary | ICD-10-CM | POA: Diagnosis not present

## 2017-11-08 DIAGNOSIS — Z0000 Encounter for general adult medical examination without abnormal findings: Secondary | ICD-10-CM | POA: Diagnosis not present

## 2017-11-15 DIAGNOSIS — E7849 Other hyperlipidemia: Secondary | ICD-10-CM | POA: Diagnosis not present

## 2017-11-15 DIAGNOSIS — D518 Other vitamin B12 deficiency anemias: Secondary | ICD-10-CM | POA: Diagnosis not present

## 2017-11-15 DIAGNOSIS — E119 Type 2 diabetes mellitus without complications: Secondary | ICD-10-CM | POA: Diagnosis not present

## 2017-11-15 DIAGNOSIS — E559 Vitamin D deficiency, unspecified: Secondary | ICD-10-CM | POA: Diagnosis not present

## 2017-11-15 DIAGNOSIS — Z79899 Other long term (current) drug therapy: Secondary | ICD-10-CM | POA: Diagnosis not present

## 2017-11-15 DIAGNOSIS — E038 Other specified hypothyroidism: Secondary | ICD-10-CM | POA: Diagnosis not present

## 2017-12-09 DIAGNOSIS — I10 Essential (primary) hypertension: Secondary | ICD-10-CM | POA: Diagnosis not present

## 2017-12-09 DIAGNOSIS — E559 Vitamin D deficiency, unspecified: Secondary | ICD-10-CM | POA: Diagnosis not present

## 2017-12-09 DIAGNOSIS — G301 Alzheimer's disease with late onset: Secondary | ICD-10-CM | POA: Diagnosis not present

## 2017-12-09 DIAGNOSIS — E538 Deficiency of other specified B group vitamins: Secondary | ICD-10-CM | POA: Diagnosis not present

## 2017-12-12 DIAGNOSIS — R293 Abnormal posture: Secondary | ICD-10-CM | POA: Diagnosis not present

## 2017-12-12 DIAGNOSIS — M545 Low back pain: Secondary | ICD-10-CM | POA: Diagnosis not present

## 2017-12-12 DIAGNOSIS — R262 Difficulty in walking, not elsewhere classified: Secondary | ICD-10-CM | POA: Diagnosis not present

## 2017-12-12 DIAGNOSIS — R278 Other lack of coordination: Secondary | ICD-10-CM | POA: Diagnosis not present

## 2017-12-12 DIAGNOSIS — M6281 Muscle weakness (generalized): Secondary | ICD-10-CM | POA: Diagnosis not present

## 2017-12-16 DIAGNOSIS — M545 Low back pain: Secondary | ICD-10-CM | POA: Diagnosis not present

## 2017-12-16 DIAGNOSIS — R262 Difficulty in walking, not elsewhere classified: Secondary | ICD-10-CM | POA: Diagnosis not present

## 2017-12-16 DIAGNOSIS — R293 Abnormal posture: Secondary | ICD-10-CM | POA: Diagnosis not present

## 2017-12-16 DIAGNOSIS — R278 Other lack of coordination: Secondary | ICD-10-CM | POA: Diagnosis not present

## 2017-12-16 DIAGNOSIS — M6281 Muscle weakness (generalized): Secondary | ICD-10-CM | POA: Diagnosis not present

## 2017-12-19 DIAGNOSIS — M545 Low back pain: Secondary | ICD-10-CM | POA: Diagnosis not present

## 2017-12-19 DIAGNOSIS — M6281 Muscle weakness (generalized): Secondary | ICD-10-CM | POA: Diagnosis not present

## 2017-12-19 DIAGNOSIS — R278 Other lack of coordination: Secondary | ICD-10-CM | POA: Diagnosis not present

## 2017-12-19 DIAGNOSIS — R293 Abnormal posture: Secondary | ICD-10-CM | POA: Diagnosis not present

## 2017-12-19 DIAGNOSIS — R262 Difficulty in walking, not elsewhere classified: Secondary | ICD-10-CM | POA: Diagnosis not present

## 2017-12-20 DIAGNOSIS — R293 Abnormal posture: Secondary | ICD-10-CM | POA: Diagnosis not present

## 2017-12-20 DIAGNOSIS — R278 Other lack of coordination: Secondary | ICD-10-CM | POA: Diagnosis not present

## 2017-12-20 DIAGNOSIS — M545 Low back pain: Secondary | ICD-10-CM | POA: Diagnosis not present

## 2017-12-20 DIAGNOSIS — R262 Difficulty in walking, not elsewhere classified: Secondary | ICD-10-CM | POA: Diagnosis not present

## 2017-12-20 DIAGNOSIS — M6281 Muscle weakness (generalized): Secondary | ICD-10-CM | POA: Diagnosis not present

## 2017-12-21 NOTE — Progress Notes (Signed)
The patient is seen in neurologic consultation at the request of Dr. Sanda Klein for the evaluation of memory.  The patient is accompanied by her daughter and son who supplement the history.  The patient is a 84 y.o. year old female who has had memory issues for about 6 months.  She noted that she would have to write things down.   Memory loss apparently got worse after the death of her son last 08-17-2023 (who had downs syndrome).  She was referred to psychiatry for MDD in 02/2016 but they state that for some reason, an appt wasn't made.  Her family notes that she has called them multiple times a day for the last 6 weeks and asked them when her husband was coming home but he has been dead for 6 years.  The patient lives alone with her dog and she is able to take care of her dog.   The patient does do the finances in the home and reports that she has no trouble with that.  The patient does drive and she reports that she has no difficulty with that but family reports that they haven't driven with her.  There have not been any motor vehicle accidents in the recent years.  The patient does cook.  There is no difficulty remembering common recipes.  Her cooking still tastes good.    The stovetop has not been left on accidentally.  The patient is able to perform her own ADL's.  The patient is able to distribute her own medications but states that she is only on a vitamin and antidepressant but her daughter does report that she was on a medication for UTI and she did get confused with that.  The patients bladder and bowel are under good control.  There have been no behavioral changes over the years.  There have been no hallucinations.  Fam hx of memory issue in her mother.  09/07/16 update:  Pt f/u today, accompanied by her daughter who supplement the history.  she saw Dr. Si Raider for neuropscyhometric testing on 06/08/16 and subsequently had a feedback session with her where results and recommendations were given to her.   These are detailed within the chart.  This confirmed the diagnosis of Alzheimer's dementia.  She talked to the patient about needing a driving evaluation, if the patient wanted to continue driving.  Last visit, I had asked her to stop driving.  She states that she has not driven.  Daughter states that they had an issue with the car and didn't get it fixed.  She had an MRI of the brain on 05/18/2016 that I had the opportunity to review.  There was mild atrophy and moderate small vessel disease.  Her B12 was rechecked since last visit and was now normal at 534.  She quit taking those in July.   Aricept was started last visit.  She is tolerating it well, without SE.  Her daughter is administering this for a month and before that she may not have been taking it.  She was dizzy but started drinking protein shakes (premier) and it helped.    03/07/17 update:  Patient follows up today, accompanied by her daughter who supplements the history.  Last visit, we talked about the fact that the patient should no longer be living alone.  Daughter reports that she now has a caregiver from 10am-5pm.  Daughter does state that she is eating better with caregivers.  Pt still insists that her husband is great and  he is just at work (he is deceased). She is on Aricept.  She tolerates the medication well, without side effects.  Patient's daughter called me in February, as Seroquel was prescribed the patient and the patient's daughter had read about the blackbox warning. That medication hasn't helped yet.  Biggest issue is angry/agitation.  One fall 3 weeks ago because was dehydrated.   06/22/17 update:  Patient seen in follow-up today.  She is accompanied by her daughter who supplements the history.  Patient is on Aricept.  Last visit, Depakote was started for mood/anger management.  She is on 250 mg twice a day.  She was already on Seroquel, 25 mg at night, which I did not change last visit.  The records that were made available to me  were reviewed.  Has been evaluated at the hospital multiple times for falls.  Several of these falls she hit her head.  She has had multiple CTs of the head since last visit.  Fortunately, these were all nonacute.  The most recent fall resulted in an admission to the hospital from 6/23-6/26/18.  Had MRI of the brain on 06/05/17 and was non acute.   Pt was d/c to [facility].  Was previously at assisted level living and now at SNF level for rehab (was not at [facility] previously).  Daughter unsure if this is permanent situation but thinks that it likely will be.   Hospital started her on risperdal 1 mg prn bid in addition to seroquel.  Her daughter called me about that and we went ahead and d/c the seroquel since daughter wasn't sure how helpful it had been.  SNF sent meds and neither seroquel nor risperdal on list but agitation better.  On remeron.  No longer walking, even with PT.  Daughter states that [facility] has done x-rays of the hip.  Pt is c/o hip pain.  Has had several falls since back to SNF  12/23/17 update: Patient is seen today in follow-up for memory change.  She is accompanied by her daughter who supplements the history.  Patient is on donepezil, 10 mg daily.  She is also on Depakote, 250 mg twice per day.  The patients daughter reports that her behavior and mood have been good and "its like a 180."  I have reviewed records since last visit.  She was in the emergency room on October 04, 2017 for fall.  Blood pressure was 70/40 at the nursing facility, but was improved upon arrival to the emergency room.  She was found to have a urinary tract infection and was treated with Keflex.  CT of the brain was nonacute.  Daughter states that she fell yesterday and hit her head and was checked at the SNF.  She also fell twice over the weekend.  She is now in PT.  She saw her sister at [place] who is in another facility and she recognized her sister and they had a good time.     Allergies: No Known  Allergies  Current Outpatient Medications on File Prior to Visit  Medication Sig Dispense Refill  . acetaminophen (TYLENOL) 325 MG tablet Take 325 mg by mouth every 6 (six) hours as needed.     Marland Kitchen buPROPion (WELLBUTRIN XL) 300 MG 24 hr tablet Take 1 tablet (300 mg total) by mouth daily. 90 tablet 1  . divalproex (DEPAKOTE) 250 MG DR tablet Take 1 tablet (250 mg total) by mouth 2 (two) times daily. 180 tablet 0  . donepezil (  ARICEPT) 10 MG tablet Take 1 tablet (10 mg total) by mouth at bedtime. 30 tablet 2  . LORazepam (ATIVAN) 0.5 MG tablet Take 0.5 mg by mouth 2 (two) times daily. TAKE 1 TABLET BY MOUTH TWICE DAILY AS NEEDED FOR ANXIETY/AGITATION (CONTROL)    . mirtazapine (REMERON) 7.5 MG tablet Take 7.5 mg by mouth at bedtime.     . vitamin B-12 (CYANOCOBALAMIN) 500 MCG tablet Take 500 mcg by mouth daily.     No current facility-administered medications on file prior to visit.      Past Medical History:  Diagnosis Date  . Alzheimer's dementia, late onset   . Depression   . Dizziness   . Heart murmur   . Hx of migraine headaches   . Hypertension   . RBBB   . Thrombocytopenia ([place])   . Vitamin D deficiency disease     Past Surgical History:  Procedure Laterality Date  . ABDOMINAL HYSTERECTOMY    . APPENDECTOMY    . CATARACT EXTRACTION, BILATERAL    . CHOLECYSTECTOMY      Social History   Socioeconomic History  . Marital status: Widowed    Spouse name: Not on file  . Number of children: Not on file  . Years of education: Not on file  . Highest education level: Not on file  Social Needs  . Financial resource strain: Not on file  . Food insecurity - worry: Not on file  . Food insecurity - inability: Not on file  . Transportation needs - medical: Not on file  . Transportation needs - non-medical: Not on file  Occupational History  . Occupation: retired    Comment: Dance movement psychotherapist  Tobacco Use  . Smoking status: Former Smoker    Packs/day: 0.50    Years: 15.00     Pack years: 7.50    Types: Cigarettes    Last attempt to quit: 12/14/2007    Years since quitting: 10.0  . Smokeless tobacco: Never Used  Substance and Sexual Activity  . Alcohol use: No    Alcohol/week: 0.0 oz  . Drug use: No  . Sexual activity: Not on file  Other Topics Concern  . Not on file  Social History Narrative  . Not on file    Family Status  Relation Name Status  . Mother  Deceased       heart disease  . Father  Deceased       unknown  . Sister  Alive       atrial fib, rheumatoid  . Sister  Alive       unknown  . Brother  Alive       unknown  . Daughter  Alive       healthy  . Son  Alive       healthy  . Son  Deceased       down syndrome  . Sister  (Not Specified)    ROS:  A complete 10 system ROS was obtained and was unremarkable except as above.   VITALS:   Vitals:   12/23/17 1124  BP: 110/62  Pulse: 82  SpO2: 96%    Wt Readings from Last 3 Encounters:  07/28/17 105 lb (47.6 kg)  06/22/17 95 lb (43.1 kg)  06/04/17 115 lb (52.2 kg)    HEENT:  Normocephalic, atraumatic. The mucous membranes are moist. The superficial temporal arteries are without ropiness or tenderness. Cardiovascular: Regular rate and rhythm. Lungs: Clear to auscultation bilaterally. Neck: There are no  carotid bruits noted bilaterally.  NEUROLOGICAL:  Orientation:  She is alert today.  She doesn't know where she is.  She thinks that she works for [employer].  It is very cold outside and she doesn't know that  - "it is warm."  She is interactive Montreal Cognitive Assessment  05/03/2016  Visuospatial/ Executive (0/5) 2  Naming (0/3) 2  Attention: Read list of digits (0/2) 2  Attention: Read list of letters (0/1) 1  Attention: Serial 7 subtraction starting at 100 (0/3) 1  Language: Repeat phrase (0/2) 2  Language : Fluency (0/1) 0  Abstraction (0/2) 2  Delayed Recall (0/5) 0  Orientation (0/6) 3  Total 15  Adjusted Score (based on education) 15    Cranial  nerves: There is good facial symmetry.  Extraocular muscles are intact and visual fields are full to confrontational testing. Speech is fluent and clear. Soft palate rises symmetrically and there is no tongue deviation. Hearing is intact to conversational tone. Tone: Tone is good throughout. Sensation: Sensation is intact to light touch throughout. Coordination:  The patient has no difficulty with RAM's or FNF bilaterally. Motor: Strength is 5/5 in the bilateral upper and lower extremities. There is no pronator drift.  There are no fasciculations noted. Gait and Station: The patient gets up with assist.  A gait belt was placed on the patient.  She somewhat drags the L leg.  Walks fairly well with the walker with cues  LABS:    Chemistry      Component Value Date/Time   NA 141 10/04/2017 1433   NA 146 (H) 03/21/2017 1059   NA 141 03/06/2012 1912   K 4.2 10/04/2017 1433   K 3.5 03/06/2012 1912   CL 106 10/04/2017 1433   CL 104 03/06/2012 1912   CO2 26 10/04/2017 1433   CO2 25 03/06/2012 1912   BUN 22 (H) 10/04/2017 1433   BUN 12 03/21/2017 1059   BUN 17 03/06/2012 1912   CREATININE 0.59 10/04/2017 1433   CREATININE 0.58 (L) 03/06/2012 1912      Component Value Date/Time   CALCIUM 9.1 10/04/2017 1433   CALCIUM 9.0 03/06/2012 1912   ALKPHOS 107 06/04/2017 1317   ALKPHOS 144 (H) 03/06/2012 1912   AST 32 06/04/2017 1317   AST 38 (H) 03/06/2012 1912   ALT 22 06/04/2017 1317   ALT 30 03/06/2012 1912   BILITOT 1.0 06/04/2017 1317   BILITOT 0.6 03/21/2017 1059   BILITOT 2.1 (H) 03/06/2012 1912     Lab Results  Component Value Date   [id number] 829 06/05/2017   IMPRESSIONS/PLAN:  1.  Alzheimer's dementia  -she saw Dr. Si Raider for neuropscyhometric testing on 06/08/16 and subsequently had a feedback session with her where results and recommendations were given to her.  These are detailed within the chart.  This confirmed the diagnosis of Alzheimer's dementia  -We discussed the  importance of physical and mental exercises and I explained to the patient and family what this means.    -We'll continue on donepezil.  -risperdal started when in the hospital on prn basis but now off according to SNF records.  seroquel did not help at low dose and off now  -on VPA - 250 mg bid and doing well on that.  Risks, benefits, side effects and alternative therapies were discussed.  The opportunity to ask questions was given and they were answered to the best of my ability.  The patient expressed understanding and willingness to follow the outlined  treatment protocols.  -engaging in activities at SNF  2.  B12 deficiency  -better on B12 injections but then stopped the injection after it was checked.  Needs to restart and will write for that at [facility]  3.  F/u prn.

## 2017-12-22 DIAGNOSIS — M545 Low back pain: Secondary | ICD-10-CM | POA: Diagnosis not present

## 2017-12-22 DIAGNOSIS — R278 Other lack of coordination: Secondary | ICD-10-CM | POA: Diagnosis not present

## 2017-12-22 DIAGNOSIS — R293 Abnormal posture: Secondary | ICD-10-CM | POA: Diagnosis not present

## 2017-12-22 DIAGNOSIS — M6281 Muscle weakness (generalized): Secondary | ICD-10-CM | POA: Diagnosis not present

## 2017-12-22 DIAGNOSIS — R262 Difficulty in walking, not elsewhere classified: Secondary | ICD-10-CM | POA: Diagnosis not present

## 2017-12-23 ENCOUNTER — Encounter: Payer: Self-pay | Admitting: Neurology

## 2017-12-23 ENCOUNTER — Ambulatory Visit (INDEPENDENT_AMBULATORY_CARE_PROVIDER_SITE_OTHER): Payer: PPO | Admitting: Neurology

## 2017-12-23 VITALS — BP 110/62 | HR 82

## 2017-12-23 DIAGNOSIS — G301 Alzheimer's disease with late onset: Secondary | ICD-10-CM

## 2017-12-23 DIAGNOSIS — F0281 Dementia in other diseases classified elsewhere with behavioral disturbance: Secondary | ICD-10-CM | POA: Diagnosis not present

## 2017-12-23 NOTE — Patient Instructions (Signed)
1.  No changes in medication.  2.  You are doing really well 3.  Continue physical therapy 4.  We will follow up as needed.

## 2017-12-26 ENCOUNTER — Emergency Department: Payer: PPO

## 2017-12-26 ENCOUNTER — Other Ambulatory Visit: Payer: Self-pay

## 2017-12-26 ENCOUNTER — Emergency Department
Admission: EM | Admit: 2017-12-26 | Discharge: 2017-12-26 | Disposition: A | Discharge status: Home / Self Care | Payer: PPO | Attending: Emergency Medicine | Admitting: Emergency Medicine

## 2017-12-26 DIAGNOSIS — S0990XA Unspecified injury of head, initial encounter: Secondary | ICD-10-CM | POA: Diagnosis not present

## 2017-12-26 DIAGNOSIS — Y999 Unspecified external cause status: Secondary | ICD-10-CM | POA: Insufficient documentation

## 2017-12-26 DIAGNOSIS — G301 Alzheimer's disease with late onset: Secondary | ICD-10-CM | POA: Diagnosis not present

## 2017-12-26 DIAGNOSIS — Y939 Activity, unspecified: Secondary | ICD-10-CM | POA: Diagnosis not present

## 2017-12-26 DIAGNOSIS — Z87891 Personal history of nicotine dependence: Secondary | ICD-10-CM | POA: Diagnosis not present

## 2017-12-26 DIAGNOSIS — I10 Essential (primary) hypertension: Secondary | ICD-10-CM | POA: Insufficient documentation

## 2017-12-26 DIAGNOSIS — M6281 Muscle weakness (generalized): Secondary | ICD-10-CM | POA: Diagnosis not present

## 2017-12-26 DIAGNOSIS — Z79899 Other long term (current) drug therapy: Secondary | ICD-10-CM | POA: Diagnosis not present

## 2017-12-26 DIAGNOSIS — R262 Difficulty in walking, not elsewhere classified: Secondary | ICD-10-CM | POA: Diagnosis not present

## 2017-12-26 DIAGNOSIS — S199XXA Unspecified injury of neck, initial encounter: Secondary | ICD-10-CM | POA: Diagnosis not present

## 2017-12-26 DIAGNOSIS — S098XXA Other specified injuries of head, initial encounter: Secondary | ICD-10-CM | POA: Diagnosis present

## 2017-12-26 DIAGNOSIS — S0003XA Contusion of scalp, initial encounter: Secondary | ICD-10-CM | POA: Insufficient documentation

## 2017-12-26 DIAGNOSIS — Y92129 Unspecified place in nursing home as the place of occurrence of the external cause: Secondary | ICD-10-CM | POA: Insufficient documentation

## 2017-12-26 DIAGNOSIS — W010XXA Fall on same level from slipping, tripping and stumbling without subsequent striking against object, initial encounter: Secondary | ICD-10-CM | POA: Diagnosis not present

## 2017-12-26 DIAGNOSIS — M545 Low back pain: Secondary | ICD-10-CM | POA: Diagnosis not present

## 2017-12-26 DIAGNOSIS — R278 Other lack of coordination: Secondary | ICD-10-CM | POA: Diagnosis not present

## 2017-12-26 DIAGNOSIS — R293 Abnormal posture: Secondary | ICD-10-CM | POA: Diagnosis not present

## 2017-12-26 NOTE — ED Triage Notes (Signed)
Patient was found on the floor and states that she tripped and fell. Patient is resident of [facility]. Has "knot" on the back of her head.

## 2017-12-26 NOTE — ED Provider Notes (Signed)
[facility] Emergency Department Provider Note   First MD Initiated Contact with Patient 12/26/17 0600     (approximate)  I have reviewed the triage vital signs and the nursing notes.   HISTORY  Chief Complaint Fall    HPI [name] is a 84 y.o. female with below list of chronic medical conditions including Alzheimer's dementia presents to the emergency department via EMS following "trip and fall".  Patient states that she tripped and fell striking the back of her head.  Patient denies any loss of consciousness.  Patient denies any pain at present.   Past Medical History:  Diagnosis Date  . Alzheimer's dementia, late onset   . Depression   . Dizziness   . Heart murmur   . Hx of migraine headaches   . Hypertension   . RBBB   . Thrombocytopenia ([place])   . Vitamin D deficiency disease     Patient Active Problem List   Diagnosis Date Noted  . Altered mental status 06/07/2017  . Hyperglycemia 06/07/2017  . Dementia with behavioral disturbance 06/07/2017  . Weakness 06/04/2017  . Late onset Alzheimer's disease without behavioral disturbance 09/07/2016  . B12 deficiency 02/07/2016  . Major depressive disorder, recurrent, moderate ([place]) 02/05/2016  . Vitamin D deficiency disease   . RBBB   . Thrombocytopenia ([place])   . Heart murmur   . Essential hypertension 09/05/2014    Past Surgical History:  Procedure Laterality Date  . ABDOMINAL HYSTERECTOMY    . APPENDECTOMY    . CATARACT EXTRACTION, BILATERAL    . CHOLECYSTECTOMY      Prior to Admission medications   Medication Sig Start Date End Date Taking? Authorizing Provider  acetaminophen (TYLENOL) 325 MG tablet Take 325 mg by mouth every 6 (six) hours as needed.     [provider]  buPROPion (WELLBUTRIN XL) 300 MG 24 hr tablet Take 1 tablet (300 mg total) by mouth daily. 02/17/17   Johnson, Megan P, DO  divalproex (DEPAKOTE) 250 MG DR tablet Take 1 tablet (250 mg total) by mouth 2  (two) times daily. 06/07/17   Theodoro Grist, MD  donepezil (ARICEPT) 10 MG tablet Take 1 tablet (10 mg total) by mouth at bedtime. 11/29/16   Tat, Eustace Quail, DO  LORazepam (ATIVAN) 0.5 MG tablet Take 0.5 mg by mouth 2 (two) times daily. TAKE 1 TABLET BY MOUTH TWICE DAILY AS NEEDED FOR ANXIETY/AGITATION (CONTROL)    [provider]  mirtazapine (REMERON) 7.5 MG tablet Take 7.5 mg by mouth at bedtime.     [provider]  vitamin B-12 (CYANOCOBALAMIN) 500 MCG tablet Take 500 mcg by mouth daily.    [provider]    Allergies No known drug allergies  Family History  Problem Relation Age of Onset  . Arthritis Mother   . Arthritis Father   . Atrial fibrillation Sister     Social History Social History   Tobacco Use  . Smoking status: Former Smoker    Packs/day: 0.50    Years: 15.00    Pack years: 7.50    Types: Cigarettes    Last attempt to quit: 12/14/2007    Years since quitting: 10.0  . Smokeless tobacco: Never Used  Substance Use Topics  . Alcohol use: No    Alcohol/week: 0.0 oz  . Drug use: No    Review of Systems Constitutional: No fever/chills Eyes: No visual changes. ENT: No sore throat. Cardiovascular: Denies chest pain. Respiratory: Denies shortness of  breath. Gastrointestinal: No abdominal pain.  No nausea, no vomiting.  No diarrhea.  No constipation. Genitourinary: Negative for dysuria. Musculoskeletal: Negative for neck pain.  Negative for back pain. Integumentary: Negative for rash. Neurological: Negative for headaches, focal weakness or numbness.   ____________________________________________   PHYSICAL EXAM:  VITAL SIGNS: ED Triage Vitals  Enc Vitals Group     BP 12/26/17 0557 121/75     Pulse Rate 12/26/17 0557 80     Resp 12/26/17 0557 18     Temp 12/26/17 0604 97.9 F (36.6 C)     Temp Source 12/26/17 0604 Oral     SpO2 12/26/17 0557 96 %     Weight 12/26/17 0558 52.2 kg (115 lb)     Height 12/26/17 0558 1.626 m  (5\' 4" )     Head Circumference --      Peak Flow --      Pain Score 12/26/17 0557 3     Pain Loc --      Pain Edu? --      Excl. in [place]? --     Constitutional: Alert and . Well appearing and in no acute distress. Eyes: Conjunctivae are normal. PERRL. EOMI. Head: Atraumatic. Mouth/Throat: Mucous membranes are moist.  Oropharynx non-erythematous. Neck: No stridor.   Cardiovascular: Normal rate, regular rhythm. Good peripheral circulation. Grossly normal heart sounds. Respiratory: Normal respiratory effort.  No retractions. Lungs CTAB. Gastrointestinal: Soft and nontender. No distention.  Musculoskeletal: No lower extremity tenderness nor edema. No gross deformities of extremities. Neurologic:  Normal speech and language. No gross focal neurologic deficits are appreciated.  Skin:  Skin is warm, dry and intact. No rash noted.  Left occipital scalp contusion Psychiatric: Mood and affect are normal. Speech and behavior are normal.    RADIOLOGY I, G[place], personally viewed and evaluated these images (plain radiographs) as part of my medical decision making, as well as reviewing the written report by the radiologist.  Ct Head Wo Contrast  Result Date: 12/26/2017 CLINICAL DATA:  84 year old female with head trauma. EXAM: CT HEAD WITHOUT CONTRAST CT CERVICAL SPINE WITHOUT CONTRAST TECHNIQUE: Multidetector CT imaging of the head and cervical spine was performed following the standard protocol without intravenous contrast. Multiplanar CT image reconstructions of the cervical spine were also generated. COMPARISON:  Head CT dated 10/04/2017 FINDINGS: CT HEAD FINDINGS Brain: Age-related atrophy and chronic microvascular ischemic changes as seen on the prior CT. There is no acute intracranial hemorrhage. No mass effect or midline shift. No extra-axial fluid collection. Vascular: No hyperdense vessel or unexpected calcification. Skull: Normal. Negative for fracture or focal lesion.  Sinuses/Orbits: There is mucoperiosteal thickening of paranasal sinuses with opacification of the left frontal sinus and multiple ethmoid air cells. Prior left mastoidectomy. The right mastoid air cells are clear. Other: None CT CERVICAL SPINE FINDINGS Alignment: No acute subluxation. Skull base and vertebrae: No acute fracture.  Osteopenia. Soft tissues and spinal canal: No prevertebral fluid or swelling. No visible canal hematoma. Disc levels:  Multilevel degenerative changes.  No acute findings. Upper chest: The visualized lung apices are clear. Other: Bilateral carotid bulb calcified plaques. IMPRESSION: 1. No acute intracranial hemorrhage. 2. Age-related atrophy and chronic microvascular ischemic changes. 3. No acute/traumatic cervical spine pathology. 4. Paranasal sinus disease. Electronically Signed   By: Anner Crete M.D.   On: 12/26/2017 06:57   Ct Cervical Spine Wo Contrast  Result Date: 12/26/2017 CLINICAL DATA:  84 year old female with head trauma. EXAM: CT HEAD WITHOUT CONTRAST CT CERVICAL SPINE  WITHOUT CONTRAST TECHNIQUE: Multidetector CT imaging of the head and cervical spine was performed following the standard protocol without intravenous contrast. Multiplanar CT image reconstructions of the cervical spine were also generated. COMPARISON:  Head CT dated 10/04/2017 FINDINGS: CT HEAD FINDINGS Brain: Age-related atrophy and chronic microvascular ischemic changes as seen on the prior CT. There is no acute intracranial hemorrhage. No mass effect or midline shift. No extra-axial fluid collection. Vascular: No hyperdense vessel or unexpected calcification. Skull: Normal. Negative for fracture or focal lesion. Sinuses/Orbits: There is mucoperiosteal thickening of paranasal sinuses with opacification of the left frontal sinus and multiple ethmoid air cells. Prior left mastoidectomy. The right mastoid air cells are clear. Other: None CT CERVICAL SPINE FINDINGS Alignment: No acute subluxation. Skull  base and vertebrae: No acute fracture.  Osteopenia. Soft tissues and spinal canal: No prevertebral fluid or swelling. No visible canal hematoma. Disc levels:  Multilevel degenerative changes.  No acute findings. Upper chest: The visualized lung apices are clear. Other: Bilateral carotid bulb calcified plaques. IMPRESSION: 1. No acute intracranial hemorrhage. 2. Age-related atrophy and chronic microvascular ischemic changes. 3. No acute/traumatic cervical spine pathology. 4. Paranasal sinus disease. Electronically Signed   By: Anner Crete M.D.   On: 12/26/2017 06:57     Procedures   ____________________________________________   INITIAL IMPRESSION / ASSESSMENT AND PLAN / ED COURSE  As part of my medical decision making, I reviewed the following data within the electronic MEDICAL RECORD [id number] year old female presented to the emergency department witnessed fall as per the patient's daughter in the shower this morning.  Patient denied any complaints on my evaluation.  CT scan of the head and cervical spine revealed any gross abnormality.  Spoke with the patient's daughter at length regarding clinical findings.  Patient daughter is agreeable with discharge plans. ____________________________________________  FINAL CLINICAL IMPRESSION(S) / ED DIAGNOSES  Final diagnoses:  Contusion of scalp, initial encounter     MEDICATIONS GIVEN DURING THIS VISIT:  Medications - No data to display   ED Discharge Orders    None       Note:  This document was prepared using Dragon voice recognition software and may include unintentional dictation errors.    Gregor Hams, MD 12/26/17 2325

## 2017-12-28 DIAGNOSIS — R278 Other lack of coordination: Secondary | ICD-10-CM | POA: Diagnosis not present

## 2017-12-28 DIAGNOSIS — R262 Difficulty in walking, not elsewhere classified: Secondary | ICD-10-CM | POA: Diagnosis not present

## 2017-12-28 DIAGNOSIS — M6281 Muscle weakness (generalized): Secondary | ICD-10-CM | POA: Diagnosis not present

## 2017-12-28 DIAGNOSIS — R293 Abnormal posture: Secondary | ICD-10-CM | POA: Diagnosis not present

## 2017-12-28 DIAGNOSIS — M545 Low back pain: Secondary | ICD-10-CM | POA: Diagnosis not present

## 2017-12-29 DIAGNOSIS — R278 Other lack of coordination: Secondary | ICD-10-CM | POA: Diagnosis not present

## 2017-12-30 DIAGNOSIS — R262 Difficulty in walking, not elsewhere classified: Secondary | ICD-10-CM | POA: Diagnosis not present

## 2017-12-30 DIAGNOSIS — R293 Abnormal posture: Secondary | ICD-10-CM | POA: Diagnosis not present

## 2017-12-30 DIAGNOSIS — F0281 Dementia in other diseases classified elsewhere with behavioral disturbance: Secondary | ICD-10-CM | POA: Diagnosis not present

## 2017-12-30 DIAGNOSIS — M6281 Muscle weakness (generalized): Secondary | ICD-10-CM | POA: Diagnosis not present

## 2017-12-30 DIAGNOSIS — R269 Unspecified abnormalities of gait and mobility: Secondary | ICD-10-CM | POA: Diagnosis not present

## 2017-12-30 DIAGNOSIS — M545 Low back pain: Secondary | ICD-10-CM | POA: Diagnosis not present

## 2017-12-30 DIAGNOSIS — R278 Other lack of coordination: Secondary | ICD-10-CM | POA: Diagnosis not present

## 2017-12-30 DIAGNOSIS — G301 Alzheimer's disease with late onset: Secondary | ICD-10-CM | POA: Diagnosis not present

## 2017-12-30 DIAGNOSIS — R42 Dizziness and giddiness: Secondary | ICD-10-CM | POA: Diagnosis not present

## 2018-01-03 DIAGNOSIS — R278 Other lack of coordination: Secondary | ICD-10-CM | POA: Diagnosis not present

## 2018-01-04 DIAGNOSIS — R278 Other lack of coordination: Secondary | ICD-10-CM | POA: Diagnosis not present

## 2018-01-04 DIAGNOSIS — R262 Difficulty in walking, not elsewhere classified: Secondary | ICD-10-CM | POA: Diagnosis not present

## 2018-01-04 DIAGNOSIS — R293 Abnormal posture: Secondary | ICD-10-CM | POA: Diagnosis not present

## 2018-01-04 DIAGNOSIS — M545 Low back pain: Secondary | ICD-10-CM | POA: Diagnosis not present

## 2018-01-04 DIAGNOSIS — M6281 Muscle weakness (generalized): Secondary | ICD-10-CM | POA: Diagnosis not present

## 2018-01-06 DIAGNOSIS — E538 Deficiency of other specified B group vitamins: Secondary | ICD-10-CM | POA: Diagnosis not present

## 2018-01-06 DIAGNOSIS — F0281 Dementia in other diseases classified elsewhere with behavioral disturbance: Secondary | ICD-10-CM | POA: Diagnosis not present

## 2018-01-06 DIAGNOSIS — R278 Other lack of coordination: Secondary | ICD-10-CM | POA: Diagnosis not present

## 2018-01-06 DIAGNOSIS — R262 Difficulty in walking, not elsewhere classified: Secondary | ICD-10-CM | POA: Diagnosis not present

## 2018-01-06 DIAGNOSIS — M545 Low back pain: Secondary | ICD-10-CM | POA: Diagnosis not present

## 2018-01-06 DIAGNOSIS — I10 Essential (primary) hypertension: Secondary | ICD-10-CM | POA: Diagnosis not present

## 2018-01-06 DIAGNOSIS — G301 Alzheimer's disease with late onset: Secondary | ICD-10-CM | POA: Diagnosis not present

## 2018-01-06 DIAGNOSIS — R293 Abnormal posture: Secondary | ICD-10-CM | POA: Diagnosis not present

## 2018-01-06 DIAGNOSIS — M6281 Muscle weakness (generalized): Secondary | ICD-10-CM | POA: Diagnosis not present

## 2018-01-10 DIAGNOSIS — M6281 Muscle weakness (generalized): Secondary | ICD-10-CM | POA: Diagnosis not present

## 2018-01-10 DIAGNOSIS — R278 Other lack of coordination: Secondary | ICD-10-CM | POA: Diagnosis not present

## 2018-01-10 DIAGNOSIS — M545 Low back pain: Secondary | ICD-10-CM | POA: Diagnosis not present

## 2018-01-10 DIAGNOSIS — R262 Difficulty in walking, not elsewhere classified: Secondary | ICD-10-CM | POA: Diagnosis not present

## 2018-01-10 DIAGNOSIS — R293 Abnormal posture: Secondary | ICD-10-CM | POA: Diagnosis not present

## 2018-01-12 DIAGNOSIS — R278 Other lack of coordination: Secondary | ICD-10-CM | POA: Diagnosis not present

## 2018-01-12 DIAGNOSIS — R262 Difficulty in walking, not elsewhere classified: Secondary | ICD-10-CM | POA: Diagnosis not present

## 2018-01-12 DIAGNOSIS — M545 Low back pain: Secondary | ICD-10-CM | POA: Diagnosis not present

## 2018-01-12 DIAGNOSIS — R293 Abnormal posture: Secondary | ICD-10-CM | POA: Diagnosis not present

## 2018-01-12 DIAGNOSIS — M6281 Muscle weakness (generalized): Secondary | ICD-10-CM | POA: Diagnosis not present

## 2018-01-17 ENCOUNTER — Emergency Department: Payer: PPO

## 2018-01-17 ENCOUNTER — Encounter: Payer: Self-pay | Admitting: Emergency Medicine

## 2018-01-17 ENCOUNTER — Inpatient Hospital Stay
Admission: EM | Admit: 2018-01-17 | Discharge: 2018-01-21 | DRG: 481 | Disposition: A | Discharge status: Skilled Nursing Facility | Payer: PPO | Attending: Internal Medicine | Admitting: Internal Medicine

## 2018-01-17 DIAGNOSIS — D696 Thrombocytopenia, unspecified: Secondary | ICD-10-CM | POA: Diagnosis present

## 2018-01-17 DIAGNOSIS — Z9049 Acquired absence of other specified parts of digestive tract: Secondary | ICD-10-CM

## 2018-01-17 DIAGNOSIS — R011 Cardiac murmur, unspecified: Secondary | ICD-10-CM | POA: Diagnosis present

## 2018-01-17 DIAGNOSIS — Y92099 Unspecified place in other non-institutional residence as the place of occurrence of the external cause: Secondary | ICD-10-CM | POA: Diagnosis not present

## 2018-01-17 DIAGNOSIS — E538 Deficiency of other specified B group vitamins: Secondary | ICD-10-CM | POA: Diagnosis not present

## 2018-01-17 DIAGNOSIS — Z9841 Cataract extraction status, right eye: Secondary | ICD-10-CM | POA: Diagnosis not present

## 2018-01-17 DIAGNOSIS — G301 Alzheimer's disease with late onset: Secondary | ICD-10-CM | POA: Diagnosis present

## 2018-01-17 DIAGNOSIS — Z419 Encounter for procedure for purposes other than remedying health state, unspecified: Secondary | ICD-10-CM

## 2018-01-17 DIAGNOSIS — F329 Major depressive disorder, single episode, unspecified: Secondary | ICD-10-CM | POA: Diagnosis present

## 2018-01-17 DIAGNOSIS — R293 Abnormal posture: Secondary | ICD-10-CM | POA: Diagnosis not present

## 2018-01-17 DIAGNOSIS — M84459A Pathological fracture, hip, unspecified, initial encounter for fracture: Secondary | ICD-10-CM | POA: Diagnosis not present

## 2018-01-17 DIAGNOSIS — Z8261 Family history of arthritis: Secondary | ICD-10-CM

## 2018-01-17 DIAGNOSIS — D62 Acute posthemorrhagic anemia: Secondary | ICD-10-CM | POA: Diagnosis not present

## 2018-01-17 DIAGNOSIS — S7291XA Unspecified fracture of right femur, initial encounter for closed fracture: Secondary | ICD-10-CM | POA: Diagnosis not present

## 2018-01-17 DIAGNOSIS — S0990XA Unspecified injury of head, initial encounter: Secondary | ICD-10-CM | POA: Diagnosis not present

## 2018-01-17 DIAGNOSIS — Z87891 Personal history of nicotine dependence: Secondary | ICD-10-CM | POA: Diagnosis not present

## 2018-01-17 DIAGNOSIS — R262 Difficulty in walking, not elsewhere classified: Secondary | ICD-10-CM | POA: Diagnosis not present

## 2018-01-17 DIAGNOSIS — Z90710 Acquired absence of both cervix and uterus: Secondary | ICD-10-CM | POA: Diagnosis not present

## 2018-01-17 DIAGNOSIS — Z7401 Bed confinement status: Secondary | ICD-10-CM | POA: Diagnosis not present

## 2018-01-17 DIAGNOSIS — E559 Vitamin D deficiency, unspecified: Secondary | ICD-10-CM | POA: Diagnosis present

## 2018-01-17 DIAGNOSIS — Z9842 Cataract extraction status, left eye: Secondary | ICD-10-CM | POA: Diagnosis not present

## 2018-01-17 DIAGNOSIS — S72001A Fracture of unspecified part of neck of right femur, initial encounter for closed fracture: Secondary | ICD-10-CM

## 2018-01-17 DIAGNOSIS — I10 Essential (primary) hypertension: Secondary | ICD-10-CM | POA: Diagnosis present

## 2018-01-17 DIAGNOSIS — F0281 Dementia in other diseases classified elsewhere with behavioral disturbance: Secondary | ICD-10-CM | POA: Diagnosis not present

## 2018-01-17 DIAGNOSIS — Z9181 History of falling: Secondary | ICD-10-CM | POA: Diagnosis not present

## 2018-01-17 DIAGNOSIS — M25559 Pain in unspecified hip: Secondary | ICD-10-CM | POA: Diagnosis not present

## 2018-01-17 DIAGNOSIS — F331 Major depressive disorder, recurrent, moderate: Secondary | ICD-10-CM | POA: Diagnosis not present

## 2018-01-17 DIAGNOSIS — Z4689 Encounter for fitting and adjustment of other specified devices: Secondary | ICD-10-CM | POA: Diagnosis not present

## 2018-01-17 DIAGNOSIS — D473 Essential (hemorrhagic) thrombocythemia: Secondary | ICD-10-CM | POA: Diagnosis not present

## 2018-01-17 DIAGNOSIS — F0280 Dementia in other diseases classified elsewhere without behavioral disturbance: Secondary | ICD-10-CM | POA: Diagnosis present

## 2018-01-17 DIAGNOSIS — Z79899 Other long term (current) drug therapy: Secondary | ICD-10-CM

## 2018-01-17 DIAGNOSIS — W19XXXA Unspecified fall, initial encounter: Secondary | ICD-10-CM | POA: Diagnosis not present

## 2018-01-17 DIAGNOSIS — R278 Other lack of coordination: Secondary | ICD-10-CM | POA: Diagnosis not present

## 2018-01-17 DIAGNOSIS — Z66 Do not resuscitate: Secondary | ICD-10-CM | POA: Diagnosis present

## 2018-01-17 DIAGNOSIS — S299XXA Unspecified injury of thorax, initial encounter: Secondary | ICD-10-CM | POA: Diagnosis not present

## 2018-01-17 DIAGNOSIS — M6281 Muscle weakness (generalized): Secondary | ICD-10-CM | POA: Diagnosis not present

## 2018-01-17 DIAGNOSIS — R509 Fever, unspecified: Secondary | ICD-10-CM | POA: Diagnosis not present

## 2018-01-17 DIAGNOSIS — S72141A Displaced intertrochanteric fracture of right femur, initial encounter for closed fracture: Principal | ICD-10-CM | POA: Diagnosis present

## 2018-01-17 DIAGNOSIS — I4510 Unspecified right bundle-branch block: Secondary | ICD-10-CM | POA: Diagnosis present

## 2018-01-17 DIAGNOSIS — F419 Anxiety disorder, unspecified: Secondary | ICD-10-CM | POA: Diagnosis present

## 2018-01-17 DIAGNOSIS — W1830XA Fall on same level, unspecified, initial encounter: Secondary | ICD-10-CM | POA: Diagnosis present

## 2018-01-17 DIAGNOSIS — S199XXA Unspecified injury of neck, initial encounter: Secondary | ICD-10-CM | POA: Diagnosis not present

## 2018-01-17 DIAGNOSIS — S72141D Displaced intertrochanteric fracture of right femur, subsequent encounter for closed fracture with routine healing: Secondary | ICD-10-CM | POA: Diagnosis not present

## 2018-01-17 DIAGNOSIS — M25551 Pain in right hip: Secondary | ICD-10-CM | POA: Diagnosis not present

## 2018-01-17 DIAGNOSIS — M545 Low back pain: Secondary | ICD-10-CM | POA: Diagnosis not present

## 2018-01-17 DIAGNOSIS — F0390 Unspecified dementia without behavioral disturbance: Secondary | ICD-10-CM | POA: Diagnosis not present

## 2018-01-17 LAB — CBC
HCT: 36.4 % (ref 35.0–47.0)
Hemoglobin: 12.2 g/dL (ref 12.0–16.0)
MCH: 31.3 pg (ref 26.0–34.0)
MCHC: 33.6 g/dL (ref 32.0–36.0)
MCV: 93 fL (ref 80.0–100.0)
PLATELETS: 129 10*3/uL — AB (ref 150–440)
RBC: 3.91 MIL/uL (ref 3.80–5.20)
RDW: 13.8 % (ref 11.5–14.5)
WBC: 8.4 10*3/uL (ref 3.6–11.0)

## 2018-01-17 LAB — BASIC METABOLIC PANEL
Anion gap: 10 (ref 5–15)
BUN: 18 mg/dL (ref 6–20)
CO2: 26 mmol/L (ref 22–32)
CREATININE: 0.57 mg/dL (ref 0.44–1.00)
Calcium: 8.6 mg/dL — ABNORMAL LOW (ref 8.9–10.3)
Chloride: 107 mmol/L (ref 101–111)
GFR calc Af Amer: >60 mL/min (ref >60)
GLUCOSE: 136 mg/dL — AB (ref 65–99)
POTASSIUM: 3.6 mmol/L (ref 3.5–5.1)
SODIUM: 143 mmol/L (ref 135–145)

## 2018-01-17 LAB — TROPONIN I: Troponin I: <0.03 ng/mL (ref <0.03)

## 2018-01-17 LAB — PROTIME-INR
INR: 1.04
PROTHROMBIN TIME: 13.5 s (ref 11.4–15.2)

## 2018-01-17 MED ORDER — SODIUM CHLORIDE 0.9 % IV BOLUS (SEPSIS)
500.0000 mL | Freq: Once | INTRAVENOUS | Status: AC
  Administered 2018-01-17: 500 mL via INTRAVENOUS

## 2018-01-17 MED ORDER — MORPHINE SULFATE (PF) 2 MG/ML IV SOLN
2.0000 mg | INTRAVENOUS | Status: DC | PRN
Start: 2018-01-17 — End: 2018-01-21
  Administered 2018-01-17: 2 mg via INTRAVENOUS
  Filled 2018-01-17: qty 1

## 2018-01-17 NOTE — ED Triage Notes (Signed)
Pt arrived via EMS from [facility] care unit, post unwitnessed fall walking to restroom. Pt has obvious shortening and rotation to the right leg. Pt has hx/o dementia. Pt received 5mcg Fentanyl as well as 4mg  zofran in route, by EMS. Pt is alert and oriented to person, place but not time. Daughter by the bedside.

## 2018-01-17 NOTE — ED Notes (Signed)
Report given to Kasey RN

## 2018-01-17 NOTE — ED Provider Notes (Signed)
[facility] Emergency Department Provider Note  ____________________________________________  Time seen: Approximately 10:51 PM  I have reviewed the triage vital signs and the nursing notes.   HISTORY  Chief Complaint Fall  Level 5 caveat:  Portions of the history and physical were unable to be obtained due to dementia   HPI [name] is a 84 y.o. female with history of Alzheimer's, hypertension, thrombocytopenia, anemia who presents for evaluation of right hip pain status post fall. Patient had a witnessed fall at her skilled nursing facility while walking to the bathroom without her walker. Patient does not remember falling. Patient was found by EMS to have a externally rotated and shortened right lower extremity. She was complaining of severe pain in her right hip which was constant since the fall, worse with movement, nonradiating. Patient received fentanyl and reports no pain at this time. She denies headache, neck pain, back pain, chest pain, abdominal pain. Patient does not recall she hit her head. She is not on blood thinners.  Past Medical History:  Diagnosis Date  . Alzheimer's dementia, late onset   . Depression   . Dizziness   . Heart murmur   . Hx of migraine headaches   . Hypertension   . RBBB   . Thrombocytopenia ([place])   . Vitamin D deficiency disease     Patient Active Problem List   Diagnosis Date Noted  . Altered mental status 06/07/2017  . Hyperglycemia 06/07/2017  . Dementia with behavioral disturbance 06/07/2017  . Weakness 06/04/2017  . Late onset Alzheimer's disease without behavioral disturbance 09/07/2016  . B12 deficiency 02/07/2016  . Major depressive disorder, recurrent, moderate ([place]) 02/05/2016  . Vitamin D deficiency disease   . RBBB   . Thrombocytopenia ([place])   . Heart murmur   . Essential hypertension 09/05/2014    Past Surgical History:  Procedure Laterality Date  . ABDOMINAL HYSTERECTOMY    .  APPENDECTOMY    . CATARACT EXTRACTION, BILATERAL    . CHOLECYSTECTOMY      Prior to Admission medications   Medication Sig Start Date End Date Taking? Authorizing Provider  acetaminophen (TYLENOL) 325 MG tablet Take 325 mg by mouth every 6 (six) hours as needed.    Yes [provider]  divalproex (DEPAKOTE) 250 MG DR tablet Take 1 tablet (250 mg total) by mouth 2 (two) times daily. 06/07/17  Yes Theodoro Grist, MD  donepezil (ARICEPT) 10 MG tablet Take 1 tablet (10 mg total) by mouth at bedtime. 11/29/16  Yes Tat, Eustace Quail, DO  LORazepam (ATIVAN) 0.5 MG tablet Take 0.5 mg by mouth 2 (two) times daily. TAKE 1 TABLET BY MOUTH TWICE DAILY AS NEEDED FOR ANXIETY/AGITATION (CONTROL)   Yes [provider]  mirtazapine (REMERON) 7.5 MG tablet Take 7.5 mg by mouth at bedtime.    Yes [provider]  vitamin B-12 (CYANOCOBALAMIN) 500 MCG tablet Take 500 mcg by mouth daily.   Yes [provider]  buPROPion (WELLBUTRIN XL) 300 MG 24 hr tablet Take 1 tablet (300 mg total) by mouth daily. Patient not taking: Reported on 01/17/2018 02/17/17   Valerie Roys, DO    Allergies Patient has no known allergies.  Family History  Problem Relation Age of Onset  . Arthritis Mother   . Arthritis Father   . Atrial fibrillation Sister     Social History Social History   Tobacco Use  . Smoking status: Former Smoker    Packs/day: 0.50  Years: 15.00    Pack years: 7.50    Types: Cigarettes    Last attempt to quit: 12/14/2007    Years since quitting: 10.1  . Smokeless tobacco: Never Used  Substance Use Topics  . Alcohol use: No    Alcohol/week: 0.0 oz  . Drug use: No    Review of Systems Constitutional: Negative for fever. Eyes: Negative for visual changes. ENT: Negative for facial injury or neck injury Cardiovascular: Negative for chest injury. Respiratory: Negative for shortness of breath. Negative for chest wall injury. Gastrointestinal: Negative for abdominal  pain or injury. Genitourinary: Negative for dysuria. Musculoskeletal: Negative for back injury, negative for arm or leg pain. + R hip pain Skin: Negative for laceration/abrasions. Neurological: Negative for head injury.  ___________________________________________   PHYSICAL EXAM:  VITAL SIGNS: ED Triage Vitals  Enc Vitals Group     BP 01/17/18 2223 (!) 167/83     Pulse Rate 01/17/18 2223 77     Resp 01/17/18 2223 16     Temp 01/17/18 2223 98.2 F (36.8 C)     Temp Source 01/17/18 2223 Oral     SpO2 01/17/18 2223 96 %     Weight 01/17/18 2223 115 lb (52.2 kg)     Height --      Head Circumference --      Peak Flow --      Pain Score 01/17/18 2251 0     Pain Loc --      Pain Edu? --      Excl. in [place]? --     Constitutional: Alert and oriented. No acute distress. Does not appear intoxicated. HEENT Head: Normocephalic and atraumatic. Face: No facial bony tenderness. Stable midface Ears: No hemotympanum bilaterally. No Battle sign Eyes: No eye injury. PERRL. No raccoon eyes Nose: Nontender. No epistaxis. No rhinorrhea Mouth/Throat: Mucous membranes are moist. No oropharyngeal blood. No dental injury. Airway patent without stridor. Normal voice. Neck: no C-collar in place. No midline c-spine tenderness.  Cardiovascular: Normal rate, regular rhythm. Normal and symmetric distal pulses are present in all extremities. Pulmonary/Chest: Chest wall is stable and nontender to palpation/compression. Normal respiratory effort. Breath sounds are normal. No crepitus.  Abdominal: Soft, nontender, non distended. Musculoskeletal: Shortened and externally rotated right lower extremity with tenderness to palpation over the proximal lateral femur. Nontender with normal full range of motion in all other extremities. No deformities. No thoracic or lumbar midline spinal tenderness. Pelvis is stable. Skin: Skin is warm, dry and intact. No abrasions or contutions. Psychiatric: Speech and behavior are  appropriate. Neurological: Normal speech and language. Moves all extremities to command. No gross focal neurologic deficits are appreciated.  Glascow Coma Score: 4 - Opens eyes on own 6 - Follows simple motor commands 5 - Alert and oriented GCS: 15   ____________________________________________   LABS (all labs ordered are listed, but only abnormal results are displayed)  Labs Reviewed  CBC - Abnormal; Notable for the following components:      Result Value   Platelets 129 (*)    All other components within normal limits  BASIC METABOLIC PANEL - Abnormal; Notable for the following components:   Glucose, Bld 136 (*)    Calcium 8.6 (*)    All other components within normal limits  TROPONIN I  PROTIME-INR  URINALYSIS, COMPLETE (UACMP) WITH MICROSCOPIC  TYPE AND SCREEN   ____________________________________________  EKG  ED ECG REPORT I, Rudene Re, the attending physician, personally viewed and interpreted this ECG.  Normal sinus  rhythm, rate of 89, RBBB, LAFB, prolonged QTC, left axis deviation, no ST elevations or depressions. Unchanged from prior ____________________________________________  RADIOLOGY  Interpreted by me: CT head and cspine: Negative  XR R hip: R hip fracture   Interpretation by Radiologist:  Ct Head Wo Contrast  Result Date: 01/17/2018 CLINICAL DATA:  Status post unwitnessed fall, with concern for head or cervical spine injury. EXAM: CT HEAD WITHOUT CONTRAST CT CERVICAL SPINE WITHOUT CONTRAST TECHNIQUE: Multidetector CT imaging of the head and cervical spine was performed following the standard protocol without intravenous contrast. Multiplanar CT image reconstructions of the cervical spine were also generated. COMPARISON:  CT of the head and cervical spine performed 12/26/2017 FINDINGS: CT HEAD FINDINGS Brain: No evidence of acute infarction, hemorrhage, hydrocephalus, extra-axial collection or mass lesion / mass effect. Prominence of the  ventricles and sulci reflects moderate cortical volume loss. Mild cerebellar atrophy is noted. Diffuse periventricular and subcortical white matter change likely reflects small vessel ischemic microangiopathy. The brainstem and fourth ventricle are within normal limits. The basal ganglia are unremarkable in appearance. The cerebral hemispheres demonstrate grossly normal gray-white differentiation. No mass effect or midline shift is seen. Vascular: No hyperdense vessel or unexpected calcification. Skull: There is no evidence of fracture; visualized osseous structures are unremarkable in appearance. Sinuses/Orbits: The visualized portions of the orbits are within normal limits. The patient is status post left-sided mastoidectomy. The paranasal sinuses and right mastoid air cells are well-aerated. Other: No significant soft tissue abnormalities are seen. CT CERVICAL SPINE FINDINGS Alignment: Normal. Skull base and vertebrae: No acute fracture. No primary bone lesion or focal pathologic process. There is incomplete fusion of the posterior arch of C1. Soft tissues and spinal canal: No prevertebral fluid or swelling. No visible canal hematoma. Disc levels: Mild multilevel disc space narrowing is noted along the cervical spine, with scattered small anterior and posterior disc osteophyte complexes. Upper chest: The visualized lung apices are clear. The visualized portions of the thyroid gland are unremarkable. Calcification is noted at the carotid bifurcations bilaterally. Other: No additional soft tissue abnormalities are seen. IMPRESSION: 1. No evidence of traumatic intracranial injury or fracture. 2. No evidence of fracture or subluxation along the cervical spine. 3. Moderate cortical volume loss and diffuse small vessel ischemic microangiopathy. 4. Mild degenerative change along the cervical spine. 5. Calcification at the carotid bifurcations bilaterally. Carotid ultrasound would be helpful for further evaluation, when  and as deemed clinically appropriate. Electronically Signed   By: Garald Balding M.D.   On: 01/17/2018 23:16   Ct Cervical Spine Wo Contrast  Result Date: 01/17/2018 CLINICAL DATA:  Status post unwitnessed fall, with concern for head or cervical spine injury. EXAM: CT HEAD WITHOUT CONTRAST CT CERVICAL SPINE WITHOUT CONTRAST TECHNIQUE: Multidetector CT imaging of the head and cervical spine was performed following the standard protocol without intravenous contrast. Multiplanar CT image reconstructions of the cervical spine were also generated. COMPARISON:  CT of the head and cervical spine performed 12/26/2017 FINDINGS: CT HEAD FINDINGS Brain: No evidence of acute infarction, hemorrhage, hydrocephalus, extra-axial collection or mass lesion / mass effect. Prominence of the ventricles and sulci reflects moderate cortical volume loss. Mild cerebellar atrophy is noted. Diffuse periventricular and subcortical white matter change likely reflects small vessel ischemic microangiopathy. The brainstem and fourth ventricle are within normal limits. The basal ganglia are unremarkable in appearance. The cerebral hemispheres demonstrate grossly normal gray-white differentiation. No mass effect or midline shift is seen. Vascular: No hyperdense vessel or unexpected calcification. Skull: There  is no evidence of fracture; visualized osseous structures are unremarkable in appearance. Sinuses/Orbits: The visualized portions of the orbits are within normal limits. The patient is status post left-sided mastoidectomy. The paranasal sinuses and right mastoid air cells are well-aerated. Other: No significant soft tissue abnormalities are seen. CT CERVICAL SPINE FINDINGS Alignment: Normal. Skull base and vertebrae: No acute fracture. No primary bone lesion or focal pathologic process. There is incomplete fusion of the posterior arch of C1. Soft tissues and spinal canal: No prevertebral fluid or swelling. No visible canal hematoma. Disc  levels: Mild multilevel disc space narrowing is noted along the cervical spine, with scattered small anterior and posterior disc osteophyte complexes. Upper chest: The visualized lung apices are clear. The visualized portions of the thyroid gland are unremarkable. Calcification is noted at the carotid bifurcations bilaterally. Other: No additional soft tissue abnormalities are seen. IMPRESSION: 1. No evidence of traumatic intracranial injury or fracture. 2. No evidence of fracture or subluxation along the cervical spine. 3. Moderate cortical volume loss and diffuse small vessel ischemic microangiopathy. 4. Mild degenerative change along the cervical spine. 5. Calcification at the carotid bifurcations bilaterally. Carotid ultrasound would be helpful for further evaluation, when and as deemed clinically appropriate. Electronically Signed   By: Garald Balding M.D.   On: 01/17/2018 23:16   Dg Chest Portable 1 View  Result Date: 01/17/2018 CLINICAL DATA:  Status post unwitnessed fall while walking to restroom, with concern for chest injury. Initial encounter. EXAM: PORTABLE CHEST 1 VIEW COMPARISON:  Chest radiograph performed 06/04/2017 FINDINGS: The lungs are well-aerated and clear. There is no evidence of focal opacification, pleural effusion or pneumothorax. The cardiomediastinal silhouette is within normal limits. No acute osseous abnormalities are seen. The liver is unremarkable in appearance. The patient is status post cholecystectomy, with clips noted at the gallbladder fossa. The common bile duct remains normal in caliber. IMPRESSION: No acute cardiopulmonary process seen. No displaced rib fractures identified. Electronically Signed   By: Garald Balding M.D.   On: 01/17/2018 23:34   Dg Hip Unilat W Or Wo Pelvis 2-3 Views Right  Result Date: 01/17/2018 CLINICAL DATA:  Status post unwitnessed fall, with shortening and rotation at the right leg. Initial encounter. EXAM: DG HIP (WITH OR WITHOUT PELVIS) 2-3V  RIGHT COMPARISON:  None. FINDINGS: There is a comminuted and mildly displaced right femoral intertrochanteric fracture, with a displaced lesser trochanteric fragment. The right femoral head remains seated at the acetabulum. The left hip joint is unremarkable in appearance. There is chronic deformity about the left-sided pubic rami. The sacroiliac joints are grossly unremarkable. The visualized bowel gas pattern is unremarkable. IMPRESSION: Comminuted and mildly displaced right femoral intertrochanteric fracture, with a displaced lesser trochanteric fragment. Electronically Signed   By: Garald Balding M.D.   On: 01/17/2018 23:33      ____________________________________________   PROCEDURES  Procedure(s) performed: None Procedures Critical Care performed:  None ____________________________________________   INITIAL IMPRESSION / ASSESSMENT AND PLAN / ED COURSE  84 y.o. female with history of Alzheimer's, hypertension, thrombocytopenia, anemia who presents for evaluation of right hip pain status post fall. Patient is hemodynamically stable, has a right lower extremity that is shortened and externally rotated concerning for a right hip fracture. There is no signs of trauma on the remaining of physical exam. No signs or symptoms of basilar skull fracture. Patient has GCS of 15 and is currently at baseline per daughter. Since fall was unwitnessed and patient does not remember due to history of dementia I  will pursue CT head, cervical spine, EKG, and basic labs to rule out any alternative etiologies such as dehydration, AKI, UTI, or electrolyte abnormalities.    _________________________ 11:18 PM on 01/17/2018 -----------------------------------------  XR showing R hip fracture. Dr. Harlow Mares ortho aware. Will admit to Hospitalist. CTs pending, labs and EKG pending   As part of my medical decision making, I reviewed the following data within the [facility] notes reviewed and  incorporated, Labs reviewed , EKG interpreted , Radiograph reviewed , Discussed with admitting physician , A consult was requested and obtained from this/these consultant(s) Orthopedics, Notes from prior ED visits and [place] Controlled Substance Database    Pertinent labs & imaging results that were available during my care of the patient were reviewed by me and considered in my medical decision making (see chart for details).    ____________________________________________   FINAL CLINICAL IMPRESSION(S) / ED DIAGNOSES  Final diagnoses:  Fall, initial encounter  Closed fracture of right hip, initial encounter ([place])      NEW MEDICATIONS STARTED DURING THIS VISIT:  ED Discharge Orders    None       Note:  This document was prepared using Dragon voice recognition software and may include unintentional dictation errors.    Alfred Levins, [place], MD 01/17/18 [phone]

## 2018-01-18 ENCOUNTER — Other Ambulatory Visit: Payer: Self-pay

## 2018-01-18 ENCOUNTER — Inpatient Hospital Stay: Payer: PPO

## 2018-01-18 ENCOUNTER — Inpatient Hospital Stay: Payer: PPO | Admitting: Anesthesiology

## 2018-01-18 ENCOUNTER — Encounter
Admission: EM | Disposition: A | Discharge status: Skilled Nursing Facility | Payer: Self-pay | Source: Home / Self Care | Attending: Internal Medicine

## 2018-01-18 ENCOUNTER — Encounter: Payer: Self-pay | Admitting: Anesthesiology

## 2018-01-18 DIAGNOSIS — R011 Cardiac murmur, unspecified: Secondary | ICD-10-CM | POA: Diagnosis present

## 2018-01-18 DIAGNOSIS — Z87891 Personal history of nicotine dependence: Secondary | ICD-10-CM | POA: Diagnosis not present

## 2018-01-18 DIAGNOSIS — Z90710 Acquired absence of both cervix and uterus: Secondary | ICD-10-CM | POA: Diagnosis not present

## 2018-01-18 DIAGNOSIS — F0280 Dementia in other diseases classified elsewhere without behavioral disturbance: Secondary | ICD-10-CM | POA: Diagnosis present

## 2018-01-18 DIAGNOSIS — Z8261 Family history of arthritis: Secondary | ICD-10-CM | POA: Diagnosis not present

## 2018-01-18 DIAGNOSIS — I4510 Unspecified right bundle-branch block: Secondary | ICD-10-CM | POA: Diagnosis present

## 2018-01-18 DIAGNOSIS — I10 Essential (primary) hypertension: Secondary | ICD-10-CM | POA: Diagnosis present

## 2018-01-18 DIAGNOSIS — S72141A Displaced intertrochanteric fracture of right femur, initial encounter for closed fracture: Secondary | ICD-10-CM | POA: Diagnosis present

## 2018-01-18 DIAGNOSIS — F329 Major depressive disorder, single episode, unspecified: Secondary | ICD-10-CM | POA: Diagnosis present

## 2018-01-18 DIAGNOSIS — D696 Thrombocytopenia, unspecified: Secondary | ICD-10-CM | POA: Diagnosis present

## 2018-01-18 DIAGNOSIS — E559 Vitamin D deficiency, unspecified: Secondary | ICD-10-CM | POA: Diagnosis present

## 2018-01-18 DIAGNOSIS — Z9842 Cataract extraction status, left eye: Secondary | ICD-10-CM | POA: Diagnosis not present

## 2018-01-18 DIAGNOSIS — R509 Fever, unspecified: Secondary | ICD-10-CM | POA: Diagnosis not present

## 2018-01-18 DIAGNOSIS — G301 Alzheimer's disease with late onset: Secondary | ICD-10-CM | POA: Diagnosis present

## 2018-01-18 DIAGNOSIS — Z66 Do not resuscitate: Secondary | ICD-10-CM | POA: Diagnosis present

## 2018-01-18 DIAGNOSIS — F419 Anxiety disorder, unspecified: Secondary | ICD-10-CM | POA: Diagnosis present

## 2018-01-18 DIAGNOSIS — D62 Acute posthemorrhagic anemia: Secondary | ICD-10-CM | POA: Diagnosis not present

## 2018-01-18 DIAGNOSIS — Z79899 Other long term (current) drug therapy: Secondary | ICD-10-CM | POA: Diagnosis not present

## 2018-01-18 DIAGNOSIS — S72001A Fracture of unspecified part of neck of right femur, initial encounter for closed fracture: Secondary | ICD-10-CM | POA: Diagnosis present

## 2018-01-18 DIAGNOSIS — Z9049 Acquired absence of other specified parts of digestive tract: Secondary | ICD-10-CM | POA: Diagnosis not present

## 2018-01-18 DIAGNOSIS — Z9841 Cataract extraction status, right eye: Secondary | ICD-10-CM | POA: Diagnosis not present

## 2018-01-18 DIAGNOSIS — Y92099 Unspecified place in other non-institutional residence as the place of occurrence of the external cause: Secondary | ICD-10-CM | POA: Diagnosis not present

## 2018-01-18 DIAGNOSIS — W1830XA Fall on same level, unspecified, initial encounter: Secondary | ICD-10-CM | POA: Diagnosis present

## 2018-01-18 HISTORY — PX: INTRAMEDULLARY (IM) NAIL INTERTROCHANTERIC: SHX5875

## 2018-01-18 LAB — CBC
HCT: 31.8 % — ABNORMAL LOW (ref 35.0–47.0)
HEMOGLOBIN: 10.6 g/dL — AB (ref 12.0–16.0)
MCH: 31.4 pg (ref 26.0–34.0)
MCHC: 33.4 g/dL (ref 32.0–36.0)
MCV: 93.8 fL (ref 80.0–100.0)
Platelets: 120 10*3/uL — ABNORMAL LOW (ref 150–440)
RBC: 3.39 MIL/uL — AB (ref 3.80–5.20)
RDW: 13.9 % (ref 11.5–14.5)
WBC: 8.8 10*3/uL (ref 3.6–11.0)

## 2018-01-18 LAB — URINALYSIS, COMPLETE (UACMP) WITH MICROSCOPIC
Bilirubin Urine: NEGATIVE
GLUCOSE, UA: NEGATIVE mg/dL
HGB URINE DIPSTICK: NEGATIVE
KETONES UR: NEGATIVE mg/dL
LEUKOCYTES UA: NEGATIVE
NITRITE: NEGATIVE
PH: 6 (ref 5.0–8.0)
Protein, ur: NEGATIVE mg/dL
Specific Gravity, Urine: 1.01 (ref 1.005–1.030)

## 2018-01-18 LAB — BASIC METABOLIC PANEL
ANION GAP: 10 (ref 5–15)
BUN: 17 mg/dL (ref 6–20)
CO2: 25 mmol/L (ref 22–32)
Calcium: 8.2 mg/dL — ABNORMAL LOW (ref 8.9–10.3)
Chloride: 108 mmol/L (ref 101–111)
Creatinine, Ser: 0.68 mg/dL (ref 0.44–1.00)
GLUCOSE: 167 mg/dL — AB (ref 65–99)
POTASSIUM: 3.8 mmol/L (ref 3.5–5.1)
SODIUM: 143 mmol/L (ref 135–145)

## 2018-01-18 LAB — GLUCOSE, CAPILLARY: Glucose-Capillary: 114 mg/dL — ABNORMAL HIGH (ref 65–99)

## 2018-01-18 LAB — MRSA PCR SCREENING: MRSA BY PCR: NEGATIVE

## 2018-01-18 SURGERY — FIXATION, FRACTURE, INTERTROCHANTERIC, WITH INTRAMEDULLARY ROD
Anesthesia: Spinal | Site: Hip | Laterality: Right | Wound class: Clean

## 2018-01-18 MED ORDER — MIDAZOLAM HCL 5 MG/5ML IJ SOLN
INTRAMUSCULAR | Status: DC | PRN
  Administered 2018-01-18: 2 mg via INTRAVENOUS

## 2018-01-18 MED ORDER — PROPOFOL 500 MG/50ML IV EMUL
INTRAVENOUS | Status: AC
  Filled 2018-01-18: qty 50

## 2018-01-18 MED ORDER — PHENYLEPHRINE HCL 10 MG/ML IJ SOLN
INTRAMUSCULAR | Status: DC | PRN
  Administered 2018-01-18 (×2): 50 ug via INTRAVENOUS
  Administered 2018-01-18: 100 ug via INTRAVENOUS

## 2018-01-18 MED ORDER — HYDROCODONE-ACETAMINOPHEN 5-325 MG PO TABS
1.0000 | ORAL_TABLET | ORAL | Status: DC | PRN
  Administered 2018-01-18: 1 via ORAL
  Administered 2018-01-18: 2 via ORAL
  Administered 2018-01-18 – 2018-01-19 (×3): 1 via ORAL
  Filled 2018-01-18: qty 1
  Filled 2018-01-18: qty 2
  Filled 2018-01-18 (×3): qty 1

## 2018-01-18 MED ORDER — BISACODYL 5 MG PO TBEC: 5.0000 mg | DELAYED_RELEASE_TABLET | Freq: Every day | ORAL | Status: DC | PRN

## 2018-01-18 MED ORDER — LACTATED RINGERS IV SOLN
INTRAVENOUS | Status: DC
  Administered 2018-01-18 – 2018-01-19 (×2): via INTRAVENOUS

## 2018-01-18 MED ORDER — TRAZODONE HCL 50 MG PO TABS: 25.0000 mg | ORAL_TABLET | Freq: Every evening | ORAL | Status: DC | PRN

## 2018-01-18 MED ORDER — CEFAZOLIN SODIUM-DEXTROSE 2-4 GM/100ML-% IV SOLN
2.0000 g | INTRAVENOUS | Status: AC
  Administered 2018-01-18: 2 g via INTRAVENOUS
  Filled 2018-01-18: qty 100

## 2018-01-18 MED ORDER — ONDANSETRON HCL 4 MG/2ML IJ SOLN: 4.0000 mg | Freq: Four times a day (QID) | INTRAMUSCULAR | Status: DC | PRN

## 2018-01-18 MED ORDER — ONDANSETRON HCL 4 MG/2ML IJ SOLN
INTRAMUSCULAR | Status: DC | PRN
  Administered 2018-01-18: 4 mg via INTRAVENOUS

## 2018-01-18 MED ORDER — BUPIVACAINE HCL (PF) 0.5 % IJ SOLN
INTRAMUSCULAR | Status: DC | PRN
  Administered 2018-01-18: 3 mL

## 2018-01-18 MED ORDER — SODIUM CHLORIDE 0.9 % IV SOLN
INTRAVENOUS | Status: DC | PRN
  Administered 2018-01-18: 18:00:00 via INTRAVENOUS

## 2018-01-18 MED ORDER — DONEPEZIL HCL 5 MG PO TABS
10.0000 mg | ORAL_TABLET | Freq: Every day | ORAL | Status: DC
Start: 2018-01-18 — End: 2018-01-21
  Administered 2018-01-18 – 2018-01-20 (×3): 10 mg via ORAL
  Filled 2018-01-18 (×4): qty 2

## 2018-01-18 MED ORDER — ONDANSETRON HCL 4 MG/2ML IJ SOLN: 4.0000 mg | Freq: Once | INTRAMUSCULAR | Status: DC | PRN

## 2018-01-18 MED ORDER — SODIUM CHLORIDE 0.9 % IV SOLN
INTRAVENOUS | Status: DC | PRN
  Administered 2018-01-18: 20 ug/min via INTRAVENOUS

## 2018-01-18 MED ORDER — METOCLOPRAMIDE HCL 5 MG/ML IJ SOLN: 5.0000 mg | Freq: Three times a day (TID) | INTRAMUSCULAR | Status: DC | PRN

## 2018-01-18 MED ORDER — MIRTAZAPINE 15 MG PO TABS
7.5000 mg | ORAL_TABLET | Freq: Every day | ORAL | Status: DC
  Administered 2018-01-18 – 2018-01-20 (×3): 7.5 mg via ORAL
  Filled 2018-01-18 (×3): qty 1

## 2018-01-18 MED ORDER — KETAMINE HCL 50 MG/ML IJ SOLN
INTRAMUSCULAR | Status: AC
  Filled 2018-01-18: qty 10

## 2018-01-18 MED ORDER — ACETAMINOPHEN 325 MG PO TABS
650.0000 mg | ORAL_TABLET | Freq: Four times a day (QID) | ORAL | Status: DC | PRN
  Administered 2018-01-20 (×2): 650 mg via ORAL
  Filled 2018-01-18: qty 2

## 2018-01-18 MED ORDER — CEFAZOLIN SODIUM-DEXTROSE 1-4 GM/50ML-% IV SOLN: 1.0000 g | Freq: Four times a day (QID) | INTRAVENOUS | Status: DC

## 2018-01-18 MED ORDER — KETAMINE HCL 50 MG/ML IJ SOLN
INTRAMUSCULAR | Status: DC | PRN
  Administered 2018-01-18: 25 mg via INTRAMUSCULAR

## 2018-01-18 MED ORDER — LORAZEPAM 0.5 MG PO TABS
0.5000 mg | ORAL_TABLET | Freq: Two times a day (BID) | ORAL | Status: DC
  Administered 2018-01-18 – 2018-01-21 (×6): 0.5 mg via ORAL
  Filled 2018-01-18 (×6): qty 1

## 2018-01-18 MED ORDER — ENOXAPARIN SODIUM 30 MG/0.3ML ~~LOC~~ SOLN
30.0000 mg | SUBCUTANEOUS | Status: DC
  Administered 2018-01-19 – 2018-01-21 (×3): 30 mg via SUBCUTANEOUS
  Filled 2018-01-18 (×3): qty 0.3

## 2018-01-18 MED ORDER — DOCUSATE SODIUM 100 MG PO CAPS
100.0000 mg | ORAL_CAPSULE | Freq: Two times a day (BID) | ORAL | Status: DC
  Administered 2018-01-18 – 2018-01-20 (×6): 100 mg via ORAL
  Filled 2018-01-18 (×7): qty 1

## 2018-01-18 MED ORDER — ACETAMINOPHEN 650 MG RE SUPP: 650.0000 mg | Freq: Four times a day (QID) | RECTAL | Status: DC | PRN

## 2018-01-18 MED ORDER — DIVALPROEX SODIUM 250 MG PO DR TAB
250.0000 mg | DELAYED_RELEASE_TABLET | Freq: Two times a day (BID) | ORAL | Status: DC
  Administered 2018-01-18 – 2018-01-20 (×7): 250 mg via ORAL
  Filled 2018-01-18 (×12): qty 1

## 2018-01-18 MED ORDER — PROPOFOL 500 MG/50ML IV EMUL
INTRAVENOUS | Status: DC | PRN
  Administered 2018-01-18: 20 ug/kg/min via INTRAVENOUS

## 2018-01-18 MED ORDER — VITAMIN B-12 1000 MCG PO TABS
500.0000 ug | ORAL_TABLET | Freq: Every day | ORAL | Status: DC
  Administered 2018-01-19: 500 ug via ORAL
  Administered 2018-01-20: 1000 ug via ORAL
  Administered 2018-01-21: 500 ug via ORAL
  Filled 2018-01-18 (×3): qty 1

## 2018-01-18 MED ORDER — SODIUM CHLORIDE 0.9 % IV SOLN
Freq: Once | INTRAVENOUS | Status: AC
  Administered 2018-01-18: 02:00:00 via INTRAVENOUS

## 2018-01-18 MED ORDER — FENTANYL CITRATE (PF) 100 MCG/2ML IJ SOLN: 25.0000 ug | INTRAMUSCULAR | Status: DC | PRN

## 2018-01-18 MED ORDER — ONDANSETRON HCL 4 MG PO TABS: 4.0000 mg | ORAL_TABLET | Freq: Four times a day (QID) | ORAL | Status: DC | PRN

## 2018-01-18 MED ORDER — METOCLOPRAMIDE HCL 10 MG PO TABS: 5.0000 mg | ORAL_TABLET | Freq: Three times a day (TID) | ORAL | Status: DC | PRN

## 2018-01-18 MED ORDER — SENNA 8.6 MG PO TABS
1.0000 | ORAL_TABLET | Freq: Two times a day (BID) | ORAL | Status: DC
Start: 2018-01-18 — End: 2018-01-21
  Administered 2018-01-18 – 2018-01-21 (×6): 8.6 mg via ORAL
  Filled 2018-01-18 (×6): qty 1

## 2018-01-18 MED ORDER — DEXTROSE 5 % IV SOLN
Freq: Four times a day (QID) | INTRAVENOUS | Status: AC
  Administered 2018-01-18 – 2018-01-19 (×3): via INTRAVENOUS
  Filled 2018-01-18 (×3): qty 10

## 2018-01-18 MED ORDER — MIDAZOLAM HCL 2 MG/2ML IJ SOLN
INTRAMUSCULAR | Status: AC
  Filled 2018-01-18: qty 2

## 2018-01-18 SURGICAL SUPPLY — 28 items
BLADE HELICAL TFNA 95 STRL (Anchor) ×2 IMPLANT
BLADE HELICAL TFNA 95MM STRL (Anchor) ×1 IMPLANT
BNDG COHESIVE 4X5 TAN STRL (GAUZE/BANDAGES/DRESSINGS) ×3 IMPLANT
BRUSH SCRUB EZ  4% CHG (MISCELLANEOUS) ×4
BRUSH SCRUB EZ 4% CHG (MISCELLANEOUS) ×2 IMPLANT
CANISTER SUCT 1200ML W/VALVE (MISCELLANEOUS) ×3 IMPLANT
CHLORAPREP W/TINT 26ML (MISCELLANEOUS) ×3 IMPLANT
DRAPE SHEET LG 3/4 BI-LAMINATE (DRAPES) ×3 IMPLANT
DRAPE U-SHAPE 47X51 STRL (DRAPES) ×3 IMPLANT
ELECT REM PT RETURN 9FT ADLT (ELECTROSURGICAL) ×3
ELECTRODE REM PT RTRN 9FT ADLT (ELECTROSURGICAL) ×1 IMPLANT
GLOVE INDICATOR 8.0 STRL GRN (GLOVE) ×3 IMPLANT
GLOVE SURG ORTHO 8.0 STRL STRW (GLOVE) ×3 IMPLANT
GOWN STRL REUS W/ TWL LRG LVL3 (GOWN DISPOSABLE) ×1 IMPLANT
GOWN STRL REUS W/ TWL XL LVL3 (GOWN DISPOSABLE) ×1 IMPLANT
GOWN STRL REUS W/TWL LRG LVL3 (GOWN DISPOSABLE) ×2
GOWN STRL REUS W/TWL XL LVL3 (GOWN DISPOSABLE) ×2
GUIDEWIRE 3.2X400 (WIRE) ×3 IMPLANT
KIT TURNOVER CYSTO (KITS) ×3 IMPLANT
NAIL 9MM/130TI CANN 340MM RT (Nail) ×3 IMPLANT
NS IRRIG 1000ML POUR BTL (IV SOLUTION) ×3 IMPLANT
PACK HIP COMPR (MISCELLANEOUS) ×3 IMPLANT
REAMER ROD DEEP FLUTE 2.5X950 (INSTRUMENTS) ×3 IMPLANT
STAPLER SKIN PROX 35W (STAPLE) ×3 IMPLANT
SUT VIC AB 0 CT1 36 (SUTURE) ×3 IMPLANT
SUT VIC AB 2-0 CT1 27 (SUTURE) ×2
SUT VIC AB 2-0 CT1 TAPERPNT 27 (SUTURE) ×1 IMPLANT
TOWEL OR 17X26 4PK STRL BLUE (TOWEL DISPOSABLE) ×3 IMPLANT

## 2018-01-18 NOTE — Op Note (Signed)
DATE OF SURGERY:  01/18/2018  TIME: 4:58 PM  PATIENT NAME:  [name]  AGE: 84 y.o.  PRE-OPERATIVE DIAGNOSIS:  right hip fracture  POST-OPERATIVE DIAGNOSIS:  SAME  PROCEDURE:  INTRAMEDULLARY (IM) NAIL INTERTROCHANTRIC (TFNA), RIGHT  SURGEON:  Lovell Sheehan  EBL:  641 cc  COMPLICATIONS:  None apparent  OPERATIVE IMPLANTS: Synthes trochanteric femoral nail  9 mm x 340 mm  with interlocking helical blade  95 mm  PREOPERATIVE INDICATIONS:  [name] is a 84 y.o. year old who fell and suffered a hip fracture. She was brought into the ER and then admitted and optimized and then elected for surgical intervention.    The risks benefits and alternatives were discussed with the patient including but not limited to the risks of nonoperative treatment, versus surgical intervention including infection, bleeding, nerve injury, malunion, nonunion, hardware prominence, hardware failure, need for hardware removal, blood clots, cardiopulmonary complications, morbidity, mortality, among others, and they were willing to proceed.    OPERATIVE PROCEDURE:  The patient was brought to the operating room and placed in the supine position.  General anesthesia was administered. She was placed on the fracture table.  Closed reduction was performed under C-arm guidance. The length of the femur was also measured using fluoroscopy. Time out was then performed after sterile prep and drape. She received preoperative antibiotics.  Incision was made proximal to the greater trochanter. A guidewire was placed in the appropriate position. Confirmation was made on AP and lateral views. The above-named nail was opened. I opened the proximal femur with a reamer. I then placed the nail by hand easily down. I did not need to ream the femur.  Once the nail was completely seated, I placed a guidepin into the femoral head into the center center position through a second incision.  I measured the length, and then reamed the  lateral cortex and up into the head. I then placed the helical blade. Slight compression was applied. Anatomic fixation achieved. Bone quality was poor.  I then secured the proximal interlock.  I then removed the instruments, and took final C-arm pictures AP and lateral the entire length of the leg. Anatomic reconstruction was achieved, and the wounds were irrigated copiously and closed with Vicryl  followed by staples and dry sterile dressing. Sponge and needle count were correct.   The patient was awakened and returned to PACU in stable and satisfactory condition. There no complications and the patient tolerated the procedure well.  She will be weightbearing as tolerated.    Lovell Sheehan

## 2018-01-18 NOTE — Consult Note (Signed)
ORTHOPAEDIC CONSULTATION  REQUESTING PHYSICIAN: Fritzi Mandes, MD  Chief Complaint: right hip pain  HPI: [name] is a 84 y.o. female who presents with right hip pain. History is taken from chart and family as patient has dementia. Please see H&P for details. Patient had witnessed mechanical fall at the nursing home.  Past Medical History:  Diagnosis Date  . Alzheimer's dementia, late onset   . Depression   . Dizziness   . Heart murmur   . Hx of migraine headaches   . Hypertension   . RBBB   . Thrombocytopenia ([place])   . Vitamin D deficiency disease    Past Surgical History:  Procedure Laterality Date  . ABDOMINAL HYSTERECTOMY    . APPENDECTOMY    . CATARACT EXTRACTION, BILATERAL    . CHOLECYSTECTOMY     Social History   Socioeconomic History  . Marital status: Widowed    Spouse name: None  . Number of children: None  . Years of education: None  . Highest education level: None  Social Needs  . Financial resource strain: None  . Food insecurity - worry: None  . Food insecurity - inability: None  . Transportation needs - medical: None  . Transportation needs - non-medical: None  Occupational History  . Occupation: retired    Comment: Dance movement psychotherapist  Tobacco Use  . Smoking status: Former Smoker    Packs/day: 0.50    Years: 15.00    Pack years: 7.50    Types: Cigarettes    Last attempt to quit: 12/14/2007    Years since quitting: 10.1  . Smokeless tobacco: Never Used  Substance and Sexual Activity  . Alcohol use: No    Alcohol/week: 0.0 oz  . Drug use: No  . Sexual activity: None  Other Topics Concern  . None  Social History Narrative  . None   Family History  Problem Relation Age of Onset  . Arthritis Mother   . Arthritis Father   . Atrial fibrillation Sister    No Known Allergies Prior to Admission medications   Medication Sig Start Date End Date Taking? Authorizing Provider  acetaminophen (TYLENOL) 325 MG tablet Take 325 mg by mouth every 6  (six) hours as needed.    Yes [provider]  divalproex (DEPAKOTE) 250 MG DR tablet Take 1 tablet (250 mg total) by mouth 2 (two) times daily. 06/07/17  Yes Theodoro Grist, MD  donepezil (ARICEPT) 10 MG tablet Take 1 tablet (10 mg total) by mouth at bedtime. 11/29/16  Yes Tat, Eustace Quail, DO  LORazepam (ATIVAN) 0.5 MG tablet Take 0.5 mg by mouth 2 (two) times daily. TAKE 1 TABLET BY MOUTH TWICE DAILY AS NEEDED FOR ANXIETY/AGITATION (CONTROL)   Yes [provider]  mirtazapine (REMERON) 7.5 MG tablet Take 7.5 mg by mouth at bedtime.    Yes [provider]  vitamin B-12 (CYANOCOBALAMIN) 500 MCG tablet Take 500 mcg by mouth daily.   Yes [provider]  buPROPion (WELLBUTRIN XL) 300 MG 24 hr tablet Take 1 tablet (300 mg total) by mouth daily. Patient not taking: Reported on 01/17/2018 02/17/17   Park Liter P, DO   Ct Head Wo Contrast  Result Date: 01/17/2018 CLINICAL DATA:  Status post unwitnessed fall, with concern for head or cervical spine injury. EXAM: CT HEAD WITHOUT CONTRAST CT CERVICAL SPINE WITHOUT CONTRAST TECHNIQUE: Multidetector CT imaging of the head and cervical spine was performed following the standard protocol without intravenous contrast. Multiplanar CT image reconstructions of  the cervical spine were also generated. COMPARISON:  CT of the head and cervical spine performed 12/26/2017 FINDINGS: CT HEAD FINDINGS Brain: No evidence of acute infarction, hemorrhage, hydrocephalus, extra-axial collection or mass lesion / mass effect. Prominence of the ventricles and sulci reflects moderate cortical volume loss. Mild cerebellar atrophy is noted. Diffuse periventricular and subcortical white matter change likely reflects small vessel ischemic microangiopathy. The brainstem and fourth ventricle are within normal limits. The basal ganglia are unremarkable in appearance. The cerebral hemispheres demonstrate grossly normal gray-white differentiation. No mass effect or  midline shift is seen. Vascular: No hyperdense vessel or unexpected calcification. Skull: There is no evidence of fracture; visualized osseous structures are unremarkable in appearance. Sinuses/Orbits: The visualized portions of the orbits are within normal limits. The patient is status post left-sided mastoidectomy. The paranasal sinuses and right mastoid air cells are well-aerated. Other: No significant soft tissue abnormalities are seen. CT CERVICAL SPINE FINDINGS Alignment: Normal. Skull base and vertebrae: No acute fracture. No primary bone lesion or focal pathologic process. There is incomplete fusion of the posterior arch of C1. Soft tissues and spinal canal: No prevertebral fluid or swelling. No visible canal hematoma. Disc levels: Mild multilevel disc space narrowing is noted along the cervical spine, with scattered small anterior and posterior disc osteophyte complexes. Upper chest: The visualized lung apices are clear. The visualized portions of the thyroid gland are unremarkable. Calcification is noted at the carotid bifurcations bilaterally. Other: No additional soft tissue abnormalities are seen. IMPRESSION: 1. No evidence of traumatic intracranial injury or fracture. 2. No evidence of fracture or subluxation along the cervical spine. 3. Moderate cortical volume loss and diffuse small vessel ischemic microangiopathy. 4. Mild degenerative change along the cervical spine. 5. Calcification at the carotid bifurcations bilaterally. Carotid ultrasound would be helpful for further evaluation, when and as deemed clinically appropriate. Electronically Signed   By: Garald Balding M.D.   On: 01/17/2018 23:16   Ct Cervical Spine Wo Contrast  Result Date: 01/17/2018 CLINICAL DATA:  Status post unwitnessed fall, with concern for head or cervical spine injury. EXAM: CT HEAD WITHOUT CONTRAST CT CERVICAL SPINE WITHOUT CONTRAST TECHNIQUE: Multidetector CT imaging of the head and cervical spine was performed following  the standard protocol without intravenous contrast. Multiplanar CT image reconstructions of the cervical spine were also generated. COMPARISON:  CT of the head and cervical spine performed 12/26/2017 FINDINGS: CT HEAD FINDINGS Brain: No evidence of acute infarction, hemorrhage, hydrocephalus, extra-axial collection or mass lesion / mass effect. Prominence of the ventricles and sulci reflects moderate cortical volume loss. Mild cerebellar atrophy is noted. Diffuse periventricular and subcortical white matter change likely reflects small vessel ischemic microangiopathy. The brainstem and fourth ventricle are within normal limits. The basal ganglia are unremarkable in appearance. The cerebral hemispheres demonstrate grossly normal gray-white differentiation. No mass effect or midline shift is seen. Vascular: No hyperdense vessel or unexpected calcification. Skull: There is no evidence of fracture; visualized osseous structures are unremarkable in appearance. Sinuses/Orbits: The visualized portions of the orbits are within normal limits. The patient is status post left-sided mastoidectomy. The paranasal sinuses and right mastoid air cells are well-aerated. Other: No significant soft tissue abnormalities are seen. CT CERVICAL SPINE FINDINGS Alignment: Normal. Skull base and vertebrae: No acute fracture. No primary bone lesion or focal pathologic process. There is incomplete fusion of the posterior arch of C1. Soft tissues and spinal canal: No prevertebral fluid or swelling. No visible canal hematoma. Disc levels: Mild multilevel disc space narrowing is  noted along the cervical spine, with scattered small anterior and posterior disc osteophyte complexes. Upper chest: The visualized lung apices are clear. The visualized portions of the thyroid gland are unremarkable. Calcification is noted at the carotid bifurcations bilaterally. Other: No additional soft tissue abnormalities are seen. IMPRESSION: 1. No evidence of  traumatic intracranial injury or fracture. 2. No evidence of fracture or subluxation along the cervical spine. 3. Moderate cortical volume loss and diffuse small vessel ischemic microangiopathy. 4. Mild degenerative change along the cervical spine. 5. Calcification at the carotid bifurcations bilaterally. Carotid ultrasound would be helpful for further evaluation, when and as deemed clinically appropriate. Electronically Signed   By: Garald Balding M.D.   On: 01/17/2018 23:16   Dg Chest Portable 1 View  Result Date: 01/17/2018 CLINICAL DATA:  Status post unwitnessed fall while walking to restroom, with concern for chest injury. Initial encounter. EXAM: PORTABLE CHEST 1 VIEW COMPARISON:  Chest radiograph performed 06/04/2017 FINDINGS: The lungs are well-aerated and clear. There is no evidence of focal opacification, pleural effusion or pneumothorax. The cardiomediastinal silhouette is within normal limits. No acute osseous abnormalities are seen. The liver is unremarkable in appearance. The patient is status post cholecystectomy, with clips noted at the gallbladder fossa. The common bile duct remains normal in caliber. IMPRESSION: No acute cardiopulmonary process seen. No displaced rib fractures identified. Electronically Signed   By: Garald Balding M.D.   On: 01/17/2018 23:34   Dg Hip Unilat W Or Wo Pelvis 2-3 Views Right  Result Date: 01/17/2018 CLINICAL DATA:  Status post unwitnessed fall, with shortening and rotation at the right leg. Initial encounter. EXAM: DG HIP (WITH OR WITHOUT PELVIS) 2-3V RIGHT COMPARISON:  None. FINDINGS: There is a comminuted and mildly displaced right femoral intertrochanteric fracture, with a displaced lesser trochanteric fragment. The right femoral head remains seated at the acetabulum. The left hip joint is unremarkable in appearance. There is chronic deformity about the left-sided pubic rami. The sacroiliac joints are grossly unremarkable. The visualized bowel gas pattern is  unremarkable. IMPRESSION: Comminuted and mildly displaced right femoral intertrochanteric fracture, with a displaced lesser trochanteric fragment. Electronically Signed   By: Garald Balding M.D.   On: 01/17/2018 23:33    Positive ROS: All other systems have been reviewed and were otherwise negative with the exception of those mentioned in the HPI and as above.  Physical Exam: General: Alert, no acute distress Cardiovascular: No pedal edema Respiratory: No cyanosis, no use of accessory musculature GI: No organomegaly, abdomen is soft and non-tender Skin: No lesions in the area of chief complaint Neurologic: Sensation intact distally Psychiatric: Patient is competent for consent with normal mood and affect Lymphatic: No axillary or cervical lymphadenopathy  MUSCULOSKELETAL: right leg short, externally rotated. DF/PF intact, good cap refill  Assessment: Right hip intertrochanteric fracture  Plan: Diagnosis and treatment options are discussed with the family. They would like to proceed with operative fixation. Plan a long trochanteric femoral nail.  The diagnosis, risks, benefits and alternatives to treatment are all discussed in detail with the patient and family. Risks include but are not limited to bleeding, infection, deep vein thrombosis, pulmonary embolism, nerve or vascular injury, non-union, repeat operation, persistent pain, weakness, stiffness and death. They understand and are eager to proceed.    Lovell Sheehan, MD    01/18/2018 4:43 PM

## 2018-01-18 NOTE — NC FL2 (Signed)
[place] LEVEL OF CARE SCREENING TOOL     IDENTIFICATION  Patient Name: [name] Birthdate: 01-Oct-1934 Sex: female Admission Date (Current Location): 01/17/2018  [place] and [place] Number:  Engineering geologist and Address:  [facility], [address], [place], [place] [address]      Provider Number: [id number]  Attending Physician Name and Address:  Fritzi Mandes, MD  Relative Name and Phone Number:       Current Level of Care: Hospital Recommended Level of Care: [place] Prior Approval Number:    Date Approved/Denied:   PASRR Number: [id number])  Discharge Plan: SNF    Current Diagnoses: Patient Active Problem List   Diagnosis Date Noted  . Closed right hip fracture ([place]) 01/18/2018  . Altered mental status 06/07/2017  . Hyperglycemia 06/07/2017  . Dementia with behavioral disturbance 06/07/2017  . Weakness 06/04/2017  . Late onset Alzheimer's disease without behavioral disturbance 09/07/2016  . B12 deficiency 02/07/2016  . Major depressive disorder, recurrent, moderate ([place]) 02/05/2016  . Vitamin D deficiency disease   . RBBB   . Thrombocytopenia ([place])   . Heart murmur   . Essential hypertension 09/05/2014    Orientation RESPIRATION BLADDER Height & Weight     Self, Place  Normal Continent Weight: 113 lb (51.3 kg) Height:  5\' 5"  (175.1 cm)  BEHAVIORAL SYMPTOMS/MOOD NEUROLOGICAL BOWEL NUTRITION STATUS      Continent Diet(NPO for surgery. )  AMBULATORY STATUS COMMUNICATION OF NEEDS Skin   Extensive Assist Verbally Normal                       Personal Care Assistance Level of Assistance  Bathing, Feeding, Dressing Bathing Assistance: Limited assistance Feeding assistance: Independent Dressing Assistance: Limited assistance     Functional Limitations Info  Sight, Hearing, Speech Sight Info: Adequate Hearing Info: Adequate Speech Info: Adequate    SPECIAL CARE FACTORS FREQUENCY   PT (By licensed PT), OT (By licensed OT)     PT Frequency: (5) OT Frequency: (5)            Contractures      Additional Factors Info  Code Status, Allergies Code Status Info: (DNR ) Allergies Info: (No Known Allergies. )           Current Medications (01/18/2018):  This is the current hospital active medication list Current Facility-Administered Medications  Medication Dose Route Frequency Provider Last Rate Last Dose  . acetaminophen (TYLENOL) tablet 650 mg  650 mg Oral Q6H PRN Amelia Jo, MD       Or  . acetaminophen (TYLENOL) suppository 650 mg  650 mg Rectal Q6H PRN Amelia Jo, MD      . bisacodyl (DULCOLAX) EC tablet 5 mg  5 mg Oral Daily PRN Amelia Jo, MD      . ceFAZolin (ANCEF) IVPB 2g/100 mL premix  2 g Intravenous 60 min Pre-Op Lovell Sheehan, MD      . divalproex (DEPAKOTE) DR tablet 250 mg  250 mg Oral BID Amelia Jo, MD   250 mg at 01/18/18 1029  . docusate sodium (COLACE) capsule 100 mg  100 mg Oral BID Amelia Jo, MD   100 mg at 01/18/18 0531  . donepezil (ARICEPT) tablet 10 mg  10 mg Oral QHS Amelia Jo, MD      . HYDROcodone-acetaminophen (NORCO/VICODIN) 5-325 MG per tablet 1-2 tablet  1-2 tablet Oral Q4H PRN Amelia Jo, MD   1 tablet at  01/18/18 1029  . LORazepam (ATIVAN) tablet 0.5 mg  0.5 mg Oral BID Amelia Jo, MD      . mirtazapine (REMERON) tablet 7.5 mg  7.5 mg Oral QHS Amelia Jo, MD      . morphine 2 MG/ML injection 2 mg  2 mg Intravenous Q2H PRN Rudene Re, MD   2 mg at 01/17/18 2337  . ondansetron (ZOFRAN) tablet 4 mg  4 mg Oral Q6H PRN Amelia Jo, MD       Or  . ondansetron [facility]) injection 4 mg  4 mg Intravenous Q6H PRN Amelia Jo, MD      . traZODone (DESYREL) tablet 25 mg  25 mg Oral QHS PRN Amelia Jo, MD      . vitamin B-12 (CYANOCOBALAMIN) tablet 500 mcg  500 mcg Oral Daily Amelia Jo, MD         Discharge Medications: Please see discharge summary for a list of discharge  medications.  Relevant Imaging Results:  Relevant Lab Results:   Additional Information (SSN: [id number])  Renne Platts, Veronia Beets, LCSW

## 2018-01-18 NOTE — Anesthesia Preprocedure Evaluation (Addendum)
Anesthesia Evaluation  Patient identified by MRN, date of birth, ID band Patient awake    Reviewed: Allergy & Precautions, H&P , NPO status , Patient's Chart, lab work & pertinent test results, reviewed documented beta blocker date and time   History of Anesthesia Complications Negative for: history of anesthetic complications  Airway Mallampati: III  TM Distance: >3 FB Neck ROM: full  Mouth opening: Limited Mouth Opening  Dental  (+) Upper Dentures, Lower Dentures, Edentulous Lower, Edentulous Upper, Dental Advidsory Given   Pulmonary neg pulmonary ROS, former smoker,           Cardiovascular Exercise Tolerance: Good hypertension, Pt. on medications (-) angina(-) CAD, (-) Past MI, (-) Cardiac Stents and (-) CABG negative cardio ROS  + dysrhythmias (RBBB) + Valvular Problems/Murmurs      Neuro/Psych  Headaches, neg Seizures PSYCHIATRIC DISORDERS Depression negative psych ROS   GI/Hepatic negative GI ROS, Neg liver ROS,   Endo/Other  negative endocrine ROS  Renal/GU negative Renal ROS  negative genitourinary   Musculoskeletal   Abdominal   Peds  Hematology negative hematology ROS (+)   Anesthesia Other Findings Past Medical History: No date: Alzheimer's dementia, late onset No date: Depression No date: Dizziness No date: Heart murmur No date: Hx of migraine headaches No date: Hypertension No date: RBBB No date: Thrombocytopenia ([facility]) No date: Vitamin D deficiency disease  Reproductive/Obstetrics negative OB ROS                            Anesthesia Physical Anesthesia Plan  ASA: III  Anesthesia Plan: Spinal   Post-op Pain Management:    Induction: Intravenous  PONV Risk Score and Plan: 2 and Ondansetron, Dexamethasone and Propofol infusion  Airway Management Planned: Simple Face Mask  Additional Equipment:   Intra-op Plan:   Post-operative Plan:   Informed Consent: I  have reviewed the patients History and Physical, chart, labs and discussed the procedure including the risks, benefits and alternatives for the proposed anesthesia with the patient or authorized representative who has indicated his/her understanding and acceptance.   Dental Advisory Given  Plan Discussed with: Anesthesiologist, CRNA and Surgeon  Anesthesia Plan Comments:        Anesthesia Quick Evaluation

## 2018-01-18 NOTE — Anesthesia Post-op Follow-up Note (Signed)
Anesthesia QCDR form completed.        

## 2018-01-18 NOTE — Progress Notes (Addendum)
[facility] [place] NAME: [name]    MR#:  [id number]  DATE OF BIRTH:  [birth year]  SUBJECTIVE:  Came in after mechanical fall at [facility]:   Review of Systems  Constitutional: Negative for chills, fever and weight loss.  HENT: Negative for ear discharge, ear pain and nosebleeds.   Eyes: Negative for blurred vision, pain and discharge.  Respiratory: Negative for sputum production, shortness of breath, wheezing and stridor.   Cardiovascular: Negative for chest pain, palpitations, orthopnea and PND.  Gastrointestinal: Negative for abdominal pain, diarrhea, nausea and vomiting.  Genitourinary: Negative for frequency and urgency.  Musculoskeletal: Positive for falls and joint pain. Negative for back pain.  Neurological: Positive for weakness. Negative for sensory change, speech change and focal weakness.  Psychiatric/Behavioral: Negative for depression and hallucinations. The patient is not nervous/anxious.    Tolerating Diet:npo Tolerating PT: pending  DRUG ALLERGIES:  No Known Allergies  VITALS:  Blood pressure (!) 100/42, pulse 76, temperature 99.1 F (37.3 C), temperature source Oral, resp. rate 16, height 5\' 5"  (1.651 m), weight 51.3 kg (113 lb), SpO2 95 %.  PHYSICAL EXAMINATION:   Physical Exam  GENERAL:  84 y.o.-year-old patient lying in the bed with no acute distress.  EYES: Pupils equal, round, reactive to light and accommodation. No scleral icterus. Extraocular muscles intact.  HEENT: Head atraumatic, normocephalic. Oropharynx and nasopharynx clear.  NECK:  Supple, no jugular venous distention. No thyroid enlargement, no tenderness.  LUNGS: Normal breath sounds bilaterally, no wheezing, rales, rhonchi. No use of accessory muscles of respiration.  CARDIOVASCULAR: S1, S2 normal. No murmurs, rubs, or gallops.  ABDOMEN: Soft, nontender, nondistended. Bowel sounds present. No organomegaly or mass.   EXTREMITIES: No cyanosis, clubbing or edema b/l.   Decreased right LE rom due to ftracture NEUROLOGIC: Cranial nerves II through XII are intact. No focal Motor or sensory deficits b/l.   PSYCHIATRIC:  patient is alert and oriented x 3.  SKIN: No obvious rash, lesion, or ulcer.   LABORATORY PANEL:  CBC Recent Labs  Lab 01/18/18 0312  WBC 8.8  HGB 10.6*  HCT 31.8*  PLT 120*    Chemistries  Recent Labs  Lab 01/18/18 0312  NA 143  K 3.8  CL 108  CO2 25  GLUCOSE 167*  BUN 17  CREATININE 0.68  CALCIUM 8.2*   Cardiac Enzymes Recent Labs  Lab 01/17/18 2238  TROPONINI <0.03   RADIOLOGY:  Ct Head Wo Contrast  Result Date: 01/17/2018 CLINICAL DATA:  Status post unwitnessed fall, with concern for head or cervical spine injury. EXAM: CT HEAD WITHOUT CONTRAST CT CERVICAL SPINE WITHOUT CONTRAST TECHNIQUE: Multidetector CT imaging of the head and cervical spine was performed following the standard protocol without intravenous contrast. Multiplanar CT image reconstructions of the cervical spine were also generated. COMPARISON:  CT of the head and cervical spine performed 12/26/2017 FINDINGS: CT HEAD FINDINGS Brain: No evidence of acute infarction, hemorrhage, hydrocephalus, extra-axial collection or mass lesion / mass effect. Prominence of the ventricles and sulci reflects moderate cortical volume loss. Mild cerebellar atrophy is noted. Diffuse periventricular and subcortical white matter change likely reflects small vessel ischemic microangiopathy. The brainstem and fourth ventricle are within normal limits. The basal ganglia are unremarkable in appearance. The cerebral hemispheres demonstrate grossly normal gray-white differentiation. No mass effect or midline shift is seen. Vascular: No hyperdense vessel or unexpected calcification. Skull: There is no evidence of fracture; visualized osseous structures  are unremarkable in appearance. Sinuses/Orbits: The visualized portions of the orbits are  within normal limits. The patient is status post left-sided mastoidectomy. The paranasal sinuses and right mastoid air cells are well-aerated. Other: No significant soft tissue abnormalities are seen. CT CERVICAL SPINE FINDINGS Alignment: Normal. Skull base and vertebrae: No acute fracture. No primary bone lesion or focal pathologic process. There is incomplete fusion of the posterior arch of C1. Soft tissues and spinal canal: No prevertebral fluid or swelling. No visible canal hematoma. Disc levels: Mild multilevel disc space narrowing is noted along the cervical spine, with scattered small anterior and posterior disc osteophyte complexes. Upper chest: The visualized lung apices are clear. The visualized portions of the thyroid gland are unremarkable. Calcification is noted at the carotid bifurcations bilaterally. Other: No additional soft tissue abnormalities are seen. IMPRESSION: 1. No evidence of traumatic intracranial injury or fracture. 2. No evidence of fracture or subluxation along the cervical spine. 3. Moderate cortical volume loss and diffuse small vessel ischemic microangiopathy. 4. Mild degenerative change along the cervical spine. 5. Calcification at the carotid bifurcations bilaterally. Carotid ultrasound would be helpful for further evaluation, when and as deemed clinically appropriate. Electronically Signed   By: Garald Balding M.D.   On: 01/17/2018 23:16   Ct Cervical Spine Wo Contrast  Result Date: 01/17/2018 CLINICAL DATA:  Status post unwitnessed fall, with concern for head or cervical spine injury. EXAM: CT HEAD WITHOUT CONTRAST CT CERVICAL SPINE WITHOUT CONTRAST TECHNIQUE: Multidetector CT imaging of the head and cervical spine was performed following the standard protocol without intravenous contrast. Multiplanar CT image reconstructions of the cervical spine were also generated. COMPARISON:  CT of the head and cervical spine performed 12/26/2017 FINDINGS: CT HEAD FINDINGS Brain: No  evidence of acute infarction, hemorrhage, hydrocephalus, extra-axial collection or mass lesion / mass effect. Prominence of the ventricles and sulci reflects moderate cortical volume loss. Mild cerebellar atrophy is noted. Diffuse periventricular and subcortical white matter change likely reflects small vessel ischemic microangiopathy. The brainstem and fourth ventricle are within normal limits. The basal ganglia are unremarkable in appearance. The cerebral hemispheres demonstrate grossly normal gray-white differentiation. No mass effect or midline shift is seen. Vascular: No hyperdense vessel or unexpected calcification. Skull: There is no evidence of fracture; visualized osseous structures are unremarkable in appearance. Sinuses/Orbits: The visualized portions of the orbits are within normal limits. The patient is status post left-sided mastoidectomy. The paranasal sinuses and right mastoid air cells are well-aerated. Other: No significant soft tissue abnormalities are seen. CT CERVICAL SPINE FINDINGS Alignment: Normal. Skull base and vertebrae: No acute fracture. No primary bone lesion or focal pathologic process. There is incomplete fusion of the posterior arch of C1. Soft tissues and spinal canal: No prevertebral fluid or swelling. No visible canal hematoma. Disc levels: Mild multilevel disc space narrowing is noted along the cervical spine, with scattered small anterior and posterior disc osteophyte complexes. Upper chest: The visualized lung apices are clear. The visualized portions of the thyroid gland are unremarkable. Calcification is noted at the carotid bifurcations bilaterally. Other: No additional soft tissue abnormalities are seen. IMPRESSION: 1. No evidence of traumatic intracranial injury or fracture. 2. No evidence of fracture or subluxation along the cervical spine. 3. Moderate cortical volume loss and diffuse small vessel ischemic microangiopathy. 4. Mild degenerative change along the cervical  spine. 5. Calcification at the carotid bifurcations bilaterally. Carotid ultrasound would be helpful for further evaluation, when and as deemed clinically appropriate. Electronically Signed   By: Jacqulynn Cadet  Chang M.D.   On: 01/17/2018 23:16   Dg Chest Portable 1 View  Result Date: 01/17/2018 CLINICAL DATA:  Status post unwitnessed fall while walking to restroom, with concern for chest injury. Initial encounter. EXAM: PORTABLE CHEST 1 VIEW COMPARISON:  Chest radiograph performed 06/04/2017 FINDINGS: The lungs are well-aerated and clear. There is no evidence of focal opacification, pleural effusion or pneumothorax. The cardiomediastinal silhouette is within normal limits. No acute osseous abnormalities are seen. The liver is unremarkable in appearance. The patient is status post cholecystectomy, with clips noted at the gallbladder fossa. The common bile duct remains normal in caliber. IMPRESSION: No acute cardiopulmonary process seen. No displaced rib fractures identified. Electronically Signed   By: Garald Balding M.D.   On: 01/17/2018 23:34   Dg Hip Unilat W Or Wo Pelvis 2-3 Views Right  Result Date: 01/17/2018 CLINICAL DATA:  Status post unwitnessed fall, with shortening and rotation at the right leg. Initial encounter. EXAM: DG HIP (WITH OR WITHOUT PELVIS) 2-3V RIGHT COMPARISON:  None. FINDINGS: There is a comminuted and mildly displaced right femoral intertrochanteric fracture, with a displaced lesser trochanteric fragment. The right femoral head remains seated at the acetabulum. The left hip joint is unremarkable in appearance. There is chronic deformity about the left-sided pubic rami. The sacroiliac joints are grossly unremarkable. The visualized bowel gas pattern is unremarkable. IMPRESSION: Comminuted and mildly displaced right femoral intertrochanteric fracture, with a displaced lesser trochanteric fragment. Electronically Signed   By: Garald Balding M.D.   On: 01/17/2018 23:33   ASSESSMENT AND PLAN:    [name]  is a 84 y.o. female with a known history of Alzheimer's dementia, hypertension and thrombocytopenia. Patient was brought to emergency room status post mechanical fall at her assisted living facility, that resulted in right hip fracture.  Patient does not recall the details of her fall, but apparently she was walking without a walker on on the hallway and lost her balance.  1.  Acute right femoral intertrochanteric fracture, status post mechanical fall. -Orthopedic consultation with Dr Harlow Mares pending -will keep her NPO for aniticipated surgery. -Continue pain control. -pt has no cardiac history other than EKG showing RBBB -no cp -she is at intermediate risk for surgery.  2.  Alzheimer's dementia, stable, start home medications.  3.  Thrombocytopenia, stable, platelet count is currently 129.  - We will continue to monitor closely.  Will hold any blood thinners at this time.   4.DVT prophlylaxis Per Ortho after surgery Till then SCD  d/w dter in the room  Case discussed with Care Management/Social Worker. Management plans discussed with the patient, family and they are in agreement.  CODE STATUS: DNR  DVT Prophylaxis: SCD for now  TOTAL TIME TAKING CARE OF THIS PATIENT: *30* minutes.  >50% time spent on counselling and coordination of care  POSSIBLE D/C IN 2-3 DAYS, DEPENDING ON CLINICAL CONDITION.  Note: This dictation was prepared with Dragon dictation along with smaller phrase technology. Any transcriptional errors that result from this process are unintentional.  Fritzi Mandes M.D on 01/18/2018 at 8:28 AM  Between 7am to 6pm - Pager - [phone]  After 6pm go to [email or web address] - password [email or web address]  Office  [phone]  CC: Primary care physician; Patient, No Pcp PerPatient ID: [name], female   DOB: 06-28-1934, 84 y.o.   MRN: [id number]

## 2018-01-18 NOTE — H&P (Signed)
[facility] NAME: [name]    MR#:  [id number]  DATE OF BIRTH:  Mar 27, 1934  DATE OF ADMISSION:  01/17/2018  PRIMARY CARE PHYSICIAN: Patient, No Pcp Per   REQUESTING/REFERRING PHYSICIAN:   CHIEF COMPLAINT:   Chief Complaint  Patient presents with  . Fall    HISTORY OF PRESENT ILLNESS: [name]  is a 84 y.o. female with a known history of Alzheimer's dementia, hypertension and thrombocytopenia. Patient was brought to emergency room status post mechanical fall at her assisted living facility, that resulted in right hip fracture.  Patient does not recall the details of her fall, but apparently she was walking without a walker on on the hallway and lost her balance.  Patient denied having chest pain, palpitations or dizziness before the fall.  Pelvis x-ray done in the emergency room showed comminuted and mildly displaced right femoral intertrochanteric fracture, with a displaced lesser trochanteric fragment.  CT of the brain and cervical spine was negative for any acute findings. Vision is admitted for further evaluation and treatment.  PAST MEDICAL HISTORY:   Past Medical History:  Diagnosis Date  . Alzheimer's dementia, late onset   . Depression   . Dizziness   . Heart murmur   . Hx of migraine headaches   . Hypertension   . RBBB   . Thrombocytopenia ([place])   . Vitamin D deficiency disease     PAST SURGICAL HISTORY:  Past Surgical History:  Procedure Laterality Date  . ABDOMINAL HYSTERECTOMY    . APPENDECTOMY    . CATARACT EXTRACTION, BILATERAL    . CHOLECYSTECTOMY      SOCIAL HISTORY:  Social History   Tobacco Use  . Smoking status: Former Smoker    Packs/day: 0.50    Years: 15.00    Pack years: 7.50    Types: Cigarettes    Last attempt to quit: 12/14/2007    Years since quitting: 10.1  . Smokeless tobacco: Never Used  Substance Use Topics  . Alcohol use: No    Alcohol/week: 0.0 oz    FAMILY  HISTORY:  Family History  Problem Relation Age of Onset  . Arthritis Mother   . Arthritis Father   . Atrial fibrillation Sister     DRUG ALLERGIES: No Known Allergies  REVIEW OF SYSTEMS:   CONSTITUTIONAL: No fever, fatigue or weakness.  EYES: No blurred or double vision.  EARS, NOSE, AND THROAT: No tinnitus or ear pain.  RESPIRATORY: No cough, shortness of breath, wheezing or hemoptysis.  CARDIOVASCULAR: No chest pain, orthopnea, edema.  GASTROINTESTINAL: No nausea, vomiting, diarrhea or abdominal pain.  GENITOURINARY: No dysuria, hematuria.  ENDOCRINE: No polyuria, nocturia,  HEMATOLOGY: No bleeding SKIN: No rash or lesion. MUSCULOSKELETAL: Right hip pain, status post fracture.   NEUROLOGIC: No  focal weakness.  PSYCHIATRY: No anxiety or depression.   MEDICATIONS AT HOME:  Prior to Admission medications   Medication Sig Start Date End Date Taking? Authorizing Provider  acetaminophen (TYLENOL) 325 MG tablet Take 325 mg by mouth every 6 (six) hours as needed.    Yes [provider]  divalproex (DEPAKOTE) 250 MG DR tablet Take 1 tablet (250 mg total) by mouth 2 (two) times daily. 06/07/17  Yes Theodoro Grist, MD  donepezil (ARICEPT) 10 MG tablet Take 1 tablet (10 mg total) by mouth at bedtime. 11/29/16  Yes Tat, Eustace Quail, DO  LORazepam (ATIVAN) 0.5 MG tablet Take 0.5 mg by mouth 2 (two) times  daily. TAKE 1 TABLET BY MOUTH TWICE DAILY AS NEEDED FOR ANXIETY/AGITATION (CONTROL)   Yes [provider]  mirtazapine (REMERON) 7.5 MG tablet Take 7.5 mg by mouth at bedtime.    Yes [provider]  vitamin B-12 (CYANOCOBALAMIN) 500 MCG tablet Take 500 mcg by mouth daily.   Yes [provider]  buPROPion (WELLBUTRIN XL) 300 MG 24 hr tablet Take 1 tablet (300 mg total) by mouth daily. Patient not taking: Reported on 01/17/2018 02/17/17   Park Liter P, DO      PHYSICAL EXAMINATION:   VITAL SIGNS: Blood pressure (!) 135/55, pulse 76, temperature 99.3 F  (37.4 C), temperature source Oral, resp. rate 16, weight 52.2 kg (115 lb), SpO2 96 %.  GENERAL:  84 y.o.-year-old patient lying in the bed, in mild distress, secondary to right hip pain.  EYES: Pupils equal, round, reactive to light and accommodation. No scleral icterus. Extraocular muscles intact.  HEENT: Head atraumatic, normocephalic. Oropharynx and nasopharynx clear.  NECK:  Supple, no jugular venous distention. No thyroid enlargement, no tenderness.  LUNGS: Normal breath sounds bilaterally, no wheezing, rales,rhonchi or crepitation. No use of accessory muscles of respiration.  CARDIOVASCULAR: S1, S2 normal. No S3/S4.  ABDOMEN: Soft, nontender, nondistended. Bowel sounds present. No organomegaly or mass.  EXTREMITIES: No pedal edema.  Right lower extremity appears shorter and externally rotated. NEUROLOGIC: No focal weakness. Gait not checked, as patient is not able to ambulate.  PSYCHIATRIC: The patient is alert and oriented x 3.  SKIN: No obvious rash, lesion, or ulcer.   LABORATORY PANEL:   CBC Recent Labs  Lab 01/17/18 2238 01/18/18 0312  WBC 8.4 8.8  HGB 12.2 10.6*  HCT 36.4 31.8*  PLT 129* 120*  MCV 93.0 93.8  MCH 31.3 31.4  MCHC 33.6 33.4  RDW 13.8 13.9   ------------------------------------------------------------------------------------------------------------------  Chemistries  Recent Labs  Lab 01/17/18 2238  NA 143  K 3.6  CL 107  CO2 26  GLUCOSE 136*  BUN 18  CREATININE 0.57  CALCIUM 8.6*   ------------------------------------------------------------------------------------------------------------------ estimated creatinine clearance is 45.4 mL/min (by C-G formula based on SCr of 0.57 mg/dL). ------------------------------------------------------------------------------------------------------------------ No results for input(s): TSH, T4TOTAL, T3FREE, THYROIDAB in the last 72 hours.  Invalid input(s): FREET3   Coagulation profile Recent Labs   Lab 01/17/18 2238  INR 1.04   ------------------------------------------------------------------------------------------------------------------- No results for input(s): DDIMER in the last 72 hours. -------------------------------------------------------------------------------------------------------------------  Cardiac Enzymes Recent Labs  Lab 01/17/18 2238  TROPONINI <0.03   ------------------------------------------------------------------------------------------------------------------ Invalid input(s): POCBNP  ---------------------------------------------------------------------------------------------------------------  Urinalysis    Component Value Date/Time   COLORURINE YELLOW (A) 01/17/2018 2350   APPEARANCEUR CLEAR (A) 01/17/2018 2350   APPEARANCEUR Cloudy (A) 04/19/2016 0945   LABSPEC 1.010 01/17/2018 2350   LABSPEC 1.019 03/06/2012 2000   PHURINE 6.0 01/17/2018 2350   GLUCOSEU NEGATIVE 01/17/2018 2350   GLUCOSEU Negative 03/06/2012 2000   HGBUR NEGATIVE 01/17/2018 2350   BILIRUBINUR NEGATIVE 01/17/2018 2350   BILIRUBINUR Negative 04/19/2016 0945   BILIRUBINUR Negative 03/06/2012 2000   KETONESUR NEGATIVE 01/17/2018 2350   PROTEINUR NEGATIVE 01/17/2018 2350   NITRITE NEGATIVE 01/17/2018 2350   LEUKOCYTESUR NEGATIVE 01/17/2018 2350   LEUKOCYTESUR Negative 04/19/2016 0945   LEUKOCYTESUR Negative 03/06/2012 2000     RADIOLOGY: Ct Head Wo Contrast  Result Date: 01/17/2018 CLINICAL DATA:  Status post unwitnessed fall, with concern for head or cervical spine injury. EXAM: CT HEAD WITHOUT CONTRAST CT CERVICAL SPINE WITHOUT CONTRAST TECHNIQUE: Multidetector CT imaging of the head and cervical spine was  performed following the standard protocol without intravenous contrast. Multiplanar CT image reconstructions of the cervical spine were also generated. COMPARISON:  CT of the head and cervical spine performed 12/26/2017 FINDINGS: CT HEAD FINDINGS Brain: No evidence  of acute infarction, hemorrhage, hydrocephalus, extra-axial collection or mass lesion / mass effect. Prominence of the ventricles and sulci reflects moderate cortical volume loss. Mild cerebellar atrophy is noted. Diffuse periventricular and subcortical white matter change likely reflects small vessel ischemic microangiopathy. The brainstem and fourth ventricle are within normal limits. The basal ganglia are unremarkable in appearance. The cerebral hemispheres demonstrate grossly normal gray-white differentiation. No mass effect or midline shift is seen. Vascular: No hyperdense vessel or unexpected calcification. Skull: There is no evidence of fracture; visualized osseous structures are unremarkable in appearance. Sinuses/Orbits: The visualized portions of the orbits are within normal limits. The patient is status post left-sided mastoidectomy. The paranasal sinuses and right mastoid air cells are well-aerated. Other: No significant soft tissue abnormalities are seen. CT CERVICAL SPINE FINDINGS Alignment: Normal. Skull base and vertebrae: No acute fracture. No primary bone lesion or focal pathologic process. There is incomplete fusion of the posterior arch of C1. Soft tissues and spinal canal: No prevertebral fluid or swelling. No visible canal hematoma. Disc levels: Mild multilevel disc space narrowing is noted along the cervical spine, with scattered small anterior and posterior disc osteophyte complexes. Upper chest: The visualized lung apices are clear. The visualized portions of the thyroid gland are unremarkable. Calcification is noted at the carotid bifurcations bilaterally. Other: No additional soft tissue abnormalities are seen. IMPRESSION: 1. No evidence of traumatic intracranial injury or fracture. 2. No evidence of fracture or subluxation along the cervical spine. 3. Moderate cortical volume loss and diffuse small vessel ischemic microangiopathy. 4. Mild degenerative change along the cervical spine. 5.  Calcification at the carotid bifurcations bilaterally. Carotid ultrasound would be helpful for further evaluation, when and as deemed clinically appropriate. Electronically Signed   By: Garald Balding M.D.   On: 01/17/2018 23:16   Ct Cervical Spine Wo Contrast  Result Date: 01/17/2018 CLINICAL DATA:  Status post unwitnessed fall, with concern for head or cervical spine injury. EXAM: CT HEAD WITHOUT CONTRAST CT CERVICAL SPINE WITHOUT CONTRAST TECHNIQUE: Multidetector CT imaging of the head and cervical spine was performed following the standard protocol without intravenous contrast. Multiplanar CT image reconstructions of the cervical spine were also generated. COMPARISON:  CT of the head and cervical spine performed 12/26/2017 FINDINGS: CT HEAD FINDINGS Brain: No evidence of acute infarction, hemorrhage, hydrocephalus, extra-axial collection or mass lesion / mass effect. Prominence of the ventricles and sulci reflects moderate cortical volume loss. Mild cerebellar atrophy is noted. Diffuse periventricular and subcortical white matter change likely reflects small vessel ischemic microangiopathy. The brainstem and fourth ventricle are within normal limits. The basal ganglia are unremarkable in appearance. The cerebral hemispheres demonstrate grossly normal gray-white differentiation. No mass effect or midline shift is seen. Vascular: No hyperdense vessel or unexpected calcification. Skull: There is no evidence of fracture; visualized osseous structures are unremarkable in appearance. Sinuses/Orbits: The visualized portions of the orbits are within normal limits. The patient is status post left-sided mastoidectomy. The paranasal sinuses and right mastoid air cells are well-aerated. Other: No significant soft tissue abnormalities are seen. CT CERVICAL SPINE FINDINGS Alignment: Normal. Skull base and vertebrae: No acute fracture. No primary bone lesion or focal pathologic process. There is incomplete fusion of the  posterior arch of C1. Soft tissues and spinal canal: No prevertebral fluid  or swelling. No visible canal hematoma. Disc levels: Mild multilevel disc space narrowing is noted along the cervical spine, with scattered small anterior and posterior disc osteophyte complexes. Upper chest: The visualized lung apices are clear. The visualized portions of the thyroid gland are unremarkable. Calcification is noted at the carotid bifurcations bilaterally. Other: No additional soft tissue abnormalities are seen. IMPRESSION: 1. No evidence of traumatic intracranial injury or fracture. 2. No evidence of fracture or subluxation along the cervical spine. 3. Moderate cortical volume loss and diffuse small vessel ischemic microangiopathy. 4. Mild degenerative change along the cervical spine. 5. Calcification at the carotid bifurcations bilaterally. Carotid ultrasound would be helpful for further evaluation, when and as deemed clinically appropriate. Electronically Signed   By: Garald Balding M.D.   On: 01/17/2018 23:16   Dg Chest Portable 1 View  Result Date: 01/17/2018 CLINICAL DATA:  Status post unwitnessed fall while walking to restroom, with concern for chest injury. Initial encounter. EXAM: PORTABLE CHEST 1 VIEW COMPARISON:  Chest radiograph performed 06/04/2017 FINDINGS: The lungs are well-aerated and clear. There is no evidence of focal opacification, pleural effusion or pneumothorax. The cardiomediastinal silhouette is within normal limits. No acute osseous abnormalities are seen. The liver is unremarkable in appearance. The patient is status post cholecystectomy, with clips noted at the gallbladder fossa. The common bile duct remains normal in caliber. IMPRESSION: No acute cardiopulmonary process seen. No displaced rib fractures identified. Electronically Signed   By: Garald Balding M.D.   On: 01/17/2018 23:34   Dg Hip Unilat W Or Wo Pelvis 2-3 Views Right  Result Date: 01/17/2018 CLINICAL DATA:  Status post  unwitnessed fall, with shortening and rotation at the right leg. Initial encounter. EXAM: DG HIP (WITH OR WITHOUT PELVIS) 2-3V RIGHT COMPARISON:  None. FINDINGS: There is a comminuted and mildly displaced right femoral intertrochanteric fracture, with a displaced lesser trochanteric fragment. The right femoral head remains seated at the acetabulum. The left hip joint is unremarkable in appearance. There is chronic deformity about the left-sided pubic rami. The sacroiliac joints are grossly unremarkable. The visualized bowel gas pattern is unremarkable. IMPRESSION: Comminuted and mildly displaced right femoral intertrochanteric fracture, with a displaced lesser trochanteric fragment. Electronically Signed   By: Garald Balding M.D.   On: 01/17/2018 23:33    EKG: Orders placed or performed during the hospital encounter of 01/17/18  . ED EKG  . ED EKG  . EKG 12-Lead  . EKG 12-Lead    IMPRESSION AND PLAN:  1.  Acute right femoral intertrochanteric fracture, status post mechanical fall.  Patient is scheduled for surgical repair in the morning.  Continue pain control. 2.  Alzheimer's dementia, stable, start home medications. 3.  Thrombocytopenia, stable, platelet count is currently 129.  We will continue to monitor closely.  Will hold any blood thinners at this time.   All the records are reviewed and case discussed with ED provider. Management plans discussed with the patient, family and they are in agreement.  CODE STATUS:    Code Status Orders  (From admission, onward)        Start     Ordered   01/18/18 0211  Do not attempt resuscitation (DNR)  Continuous    Question Answer Comment  In the event of cardiac or respiratory ARREST Do not call a "code blue"   In the event of cardiac or respiratory ARREST Do not perform Intubation, CPR, defibrillation or ACLS   In the event of cardiac or respiratory ARREST Use medication  by any route, position, wound care, and other measures to relive pain  and suffering. May use oxygen, suction and manual treatment of airway obstruction as needed for comfort.      01/18/18 0210    Code Status History    Date Active Date Inactive Code Status Order ID Comments User Context   06/04/2017 19:15 06/07/2017 19:28 DNR [id number]  Fritzi Mandes, MD Inpatient    Advance Directive Documentation     Most Recent Value  Type of Advance Directive  Out of facility DNR (pink MOST or yellow form)  Pre-existing out of facility DNR order (yellow form or pink MOST form)  No data  "MOST" Form in Place?  No data       TOTAL TIME TAKING CARE OF THIS PATIENT: 35 minutes.    Amelia Jo M.D on 01/18/2018 at 4:01 AM  Between 7am to 6pm - Pager - [phone]  After 6pm go to [email or web address] - password [email or web address] [facility]  Office  [phone]  CC: Primary care physician; Patient, No Pcp Per

## 2018-01-18 NOTE — Clinical Social Work Note (Signed)
Clinical Social Work Assessment  Patient Details  Name: [name] MRN: [id number] Date of Birth: February 01, 1934  Date of referral:  01/18/18               Reason for consult:  Facility Placement                Permission sought to share information with:  Chartered certified accountant granted to share information::  Yes, Verbal Permission Granted  Name::      [place]::   [place]   Relationship::     Contact Information:     Housing/Transportation Living arrangements for the past 2 months:  [place] of Information:  Patient, Adult Children Patient Interpreter Needed:  None Criminal Activity/Legal Involvement Pertinent to Current Situation/Hospitalization:  No - Comment as needed Significant Relationships:  Adult Children Lives with:  Facility Resident Do you feel safe going back to the place where you live?  Yes Need for family participation in patient care:  Yes (Comment)  Care giving concerns:  Patient is a resident at [facility] however she will need to go to SNF for short term rehab.    Social Worker assessment / plan:  Holiday representative ([place]) reviewed chart and noted that [facility] consult is pending for a hip fracture. CSW met with patient and her daughter/ HPOA Carrie [phone] was at bedside. Patient was alert and oriented X4 and was laying in the bed. CSW introduced self and explained role of CSW department. Patient reported that she lives at [facility]. CSW explained that PT will evaluate patient and make a recommendation of home health or SNF. CSW explained that patient will likely need SNF and Health Team will have to approve it. Patient and her daughter verbalized their understanding and are agreeable to SNF search in [place]. FL2 complete and faxed out. CSW will continue to follow and assist as needed.   Employment status:  Retired Office manager PT Recommendations:  Not assessed at this time [place] / Referral to community resources:  [place]  Patient/Family's Response to care:  Patient and her daughter are agreeable to SNF search in [place].   Patient/Family's Understanding of and Emotional Response to Diagnosis, Current Treatment, and Prognosis:  Patient and her daughter were very pleasant and thanked CSW for assistance.    Emotional Assessment Appearance:  Appears stated age Attitude/Demeanor/Rapport:    Affect (typically observed):  Accepting, Adaptable, Pleasant Orientation:  Oriented to Self, Oriented to Place Alcohol / Substance use:  Not Applicable Psych involvement (Current and /or in the community):  No (Comment)  Discharge Needs  Concerns to be addressed:  Discharge Planning Concerns Readmission within the last 30 days:  No Current discharge risk:  Dependent with Mobility Barriers to Discharge:  Continued Medical Work up   [employer], Veronia Beets, LCSW 01/18/2018, 3:20 PM

## 2018-01-18 NOTE — Transfer of Care (Signed)
Immediate Anesthesia Transfer of Care Note  Patient: [name]  Procedure(s) Performed: INTRAMEDULLARY (IM) NAIL INTERTROCHANTRIC (TFNA) (Right Hip)  Patient Location: PACU  Anesthesia Type:Spinal  Level of Consciousness: awake, alert  and patient cooperative  Airway & Oxygen Therapy: Patient Spontanous Breathing and Patient connected to face mask oxygen  Post-op Assessment: Report given to RN and Post -op Vital signs reviewed and stable  Post vital signs: Reviewed and stable  Last Vitals:  Vitals:   01/18/18 1447 01/18/18 1933  BP: (!) 107/40 (!) (P) 98/50  Pulse: 81 (P) 93  Resp: 18 (P) 17  Temp: 37.1 C (P) 37.4 C  SpO2: 94% (P) 94%    Last Pain:  Vitals:   01/18/18 1447  TempSrc: Oral  PainSc:          Complications: No apparent anesthesia complications

## 2018-01-18 NOTE — Clinical Social Work Placement (Signed)
   CLINICAL SOCIAL WORK PLACEMENT  NOTE  Date:  01/18/2018  Patient Details  Name: [name] MRN: [id number] Date of Birth: 01-02-1934  Clinical Social Work is seeking post-discharge placement for this patient at the [place] level of care (*CSW will initial, date and re-position this form in  chart as items are completed):  Yes   Patient/family provided with [place] Work Department's list of facilities offering this level of care within the geographic area requested by the patient (or if unable, by the patient's family).  Yes   Patient/family informed of their freedom to choose among providers that offer the needed level of care, that participate in Medicare, Medicaid or managed care program needed by the patient, have an available bed and are willing to accept the patient.  Yes   Patient/family informed of [facility]'s ownership interest in [facility] and [facility], as well as of the fact that they are under no obligation to receive care at these facilities.  PASRR submitted to EDS on       PASRR number received on       Existing PASRR number confirmed on 01/18/18     FL2 transmitted to all facilities in geographic area requested by pt/family on 01/18/18     FL2 transmitted to all facilities within larger geographic area on       Patient informed that his/her managed care company has contracts with or will negotiate with certain facilities, including the following:            Patient/family informed of bed offers received.  Patient chooses bed at       Physician recommends and patient chooses bed at      Patient to be transferred to   on  .  Patient to be transferred to facility by       Patient family notified on   of transfer.  Name of family member notified:        PHYSICIAN       Additional Comment:    _______________________________________________ Barby Colvard, Veronia Beets, LCSW 01/18/2018, 3:19 PM

## 2018-01-18 NOTE — Anesthesia Procedure Notes (Addendum)
Spinal  Patient location during procedure: OR Start time: 01/18/2018 6:25 PM End time: 01/18/2018 6:27 PM Staffing Anesthesiologist: Martha Clan, MD Resident/CRNA: Nelda Marseille, CRNA Performed: resident/CRNA  Preanesthetic Checklist Completed: patient identified, site marked, surgical consent, pre-op evaluation, timeout performed, IV checked, risks and benefits discussed and monitors and equipment checked Spinal Block Patient position: right lateral decubitus Prep: ChloraPrep Patient monitoring: heart rate, continuous pulse ox, blood pressure and cardiac monitor Approach: midline Location: L3-4 Injection technique: single-shot Needle Needle type: [place] and Introducer  Needle gauge: 24 G Needle length: 9 cm Assessment Sensory level: T10 Additional Notes Negative paresthesia. Negative blood return. Positive free-flowing CSF. Expiration date of kit checked and confirmed. Patient tolerated procedure well, without complications.

## 2018-01-18 NOTE — Anesthesia Procedure Notes (Signed)
Date/Time: 01/18/2018 6:36 PM Performed by: Nelda Marseille, CRNA Pre-anesthesia Checklist: Patient identified, Emergency Drugs available, Suction available, Patient being monitored and Timeout performed Oxygen Delivery Method: Simple face mask

## 2018-01-19 ENCOUNTER — Encounter: Payer: Self-pay | Admitting: Orthopedic Surgery

## 2018-01-19 ENCOUNTER — Encounter
Admission: RE | Admit: 2018-01-19 | Discharge: 2018-01-19 | Disposition: A | Discharge status: Home / Self Care | Payer: PPO | Source: Ambulatory Visit | Attending: Internal Medicine | Admitting: Internal Medicine

## 2018-01-19 LAB — COMPREHENSIVE METABOLIC PANEL
ALBUMIN: 2.8 g/dL — AB (ref 3.5–5.0)
ALT: 109 U/L — ABNORMAL HIGH (ref 14–54)
ANION GAP: 7 (ref 5–15)
AST: 106 U/L — ABNORMAL HIGH (ref 15–41)
Alkaline Phosphatase: 80 U/L (ref 38–126)
BILIRUBIN TOTAL: 0.6 mg/dL (ref 0.3–1.2)
BUN: 17 mg/dL (ref 6–20)
CO2: 26 mmol/L (ref 22–32)
Calcium: 7.9 mg/dL — ABNORMAL LOW (ref 8.9–10.3)
Chloride: 107 mmol/L (ref 101–111)
Creatinine, Ser: 0.44 mg/dL (ref 0.44–1.00)
GFR calc Af Amer: >60 mL/min (ref >60)
GFR calc non Af Amer: >60 mL/min (ref >60)
GLUCOSE: 132 mg/dL — AB (ref 65–99)
POTASSIUM: 3.9 mmol/L (ref 3.5–5.1)
Sodium: 140 mmol/L (ref 135–145)
TOTAL PROTEIN: 4.9 g/dL — AB (ref 6.5–8.1)

## 2018-01-19 LAB — GLUCOSE, CAPILLARY: Glucose-Capillary: 116 mg/dL — ABNORMAL HIGH (ref 65–99)

## 2018-01-19 MED ORDER — BISACODYL 10 MG RE SUPP
10.0000 mg | Freq: Once | RECTAL | Status: AC
  Administered 2018-01-19: 10 mg via RECTAL
  Filled 2018-01-19: qty 1

## 2018-01-19 NOTE — Progress Notes (Signed)
Clinical Education officer, museum (CSW) notified Health Team case manager that PT note is available for review for SNF authorization.   [employer], LCSW [phone]

## 2018-01-19 NOTE — Progress Notes (Signed)
Physical Therapy Treatment Patient Details Name: [name] MRN: [id number] DOB: [birth year] Today's Date: 01/19/2018    History of Present Illness Pt is an83 y.o.femalewith a known history of Alzheimer's dementia, hypertension and thrombocytopenia.  Patient was brought to emergency room status post mechanical fall at her assisted living facility,that resulted in right hip fracture. Patient does not recall the details of her fall,but apparently she was walking without a walker in the hallway and lost her balance.Patient denied having chest pain,palpitations or dizziness before the fall. Pelvis x-ray done in the emergency room showedcomminuted and mildly displaced right femoral intertrochanteric fracture, with a displaced lesser trochanteric fragment.CT of the brain and cervical spine was negative for any acute findings.  Pt is now s/p [name].      PT Comments    Pt presents with deficits in strength, transfers, mobility, gait, balance, and activity tolerance.  Pt lethargic this session but slowly improved with activity.  Pt continues to report no pain at rest but is in visible distress with attempts to move the RLE.  Pt required +2 Max A for bed mobility tasks this session and was unable to stand secondary to RLE pain with lethargy contributing as well, nursing notified.  Pt's SpO2 on room air was 91-92% during session with HR in the 90s.  Pt will benefit from PT services in a SNF setting upon discharge to safely address above deficits for decreased caregiver assistance and eventual return to PLOF.     Follow Up Recommendations  SNF     Equipment Recommendations  None recommended by PT    Recommendations for Other Services       Precautions / Restrictions Precautions Precautions: Fall Restrictions Weight Bearing Restrictions: Yes RLE Weight Bearing: Weight bearing as tolerated    Mobility  Bed Mobility Overal bed mobility: Needs Assistance Bed Mobility: Supine to  Sit;Sit to Supine     Supine to sit: Max assist;+2 for physical assistance Sit to supine: Max assist;+2 for physical assistance   General bed mobility comments: Max A for BLEs and trunk in and out of bed  Transfers Overall transfer level: Needs assistance Equipment used: Rolling walker (2 wheeled) Transfers: Sit to/from Stand Sit to Stand: Mod assist         General transfer comment: Pt able to bear weight through BLEs into floor but unable to clear EOB secondary to RLE pain  Ambulation/Gait             General Gait Details: Unable   Stairs            Wheelchair Mobility    Modified Rankin (Stroke Patients Only)       Balance Overall balance assessment: Needs assistance Sitting-balance support: Feet supported;Bilateral upper extremity supported Sitting balance-Leahy Scale: Fair     Standing balance support: Bilateral upper extremity supported Standing balance-Leahy Scale: Poor Standing balance comment: NT this session                            Cognition Arousal/Alertness: Awake/alert Behavior During Therapy: WFL for tasks assessed/performed Overall Cognitive Status: No family/caregiver present to determine baseline cognitive functioning                                        Exercises Total Joint Exercises Ankle Circles/Pumps: AROM;Both;10 reps Quad Sets: AAROM;Both;10 reps Short Arc  Quad: AAROM;Both;10 reps Hip ABduction/ADduction: AAROM;Both;10 reps Straight Leg Raises: AAROM;Both;10 reps Long Arc Quad: AAROM;AROM;Both;10 reps;5 reps Knee Flexion: AROM;AAROM;Both;5 reps;10 reps Other Exercises Other Exercises: Anterior weight shifting activities in sitting to minimize posterior lean    General Comments        Pertinent Vitals/Pain Pain Assessment: No/denies pain Pain Score: 0-No pain Pain Location: No pain at rest but "hurts a lot" to the R hip with RLE movement Pain Descriptors / Indicators:  Grimacing;Moaning Pain Intervention(s): Limited activity within patient's tolerance;Premedicated before session;Monitored during session    Home Living Family/patient expects to be discharged to:: Assisted living([facility]; history from daughter Posey Pronto by phone)             Home Equipment: Gilford Rile - 2 wheels      Prior Function Level of Independence: Needs assistance  Gait / Transfers Assistance Needed: Mod Ind with amb facility distances at [place], memory care unit; extensive fall history per daughter ADL's / Homemaking Assistance Needed: Staff assists with all ADLs with patient able to feed herself     PT Goals (current goals can now be found in the care plan section) Acute Rehab PT Goals PT Goal Formulation: Patient unable to participate in goal setting Time For Goal Achievement: 02/01/18 Potential to Achieve Goals: Fair    Frequency    BID      PT Plan Current plan remains appropriate    Co-evaluation              AM-PAC PT "6 Clicks" Daily Activity  Outcome Measure  Difficulty turning over in bed (including adjusting bedclothes, sheets and blankets)?: Unable Difficulty moving from lying on back to sitting on the side of the bed? : Unable Difficulty sitting down on and standing up from a chair with arms (e.g., wheelchair, bedside commode, etc,.)?: Unable Help needed moving to and from a bed to chair (including a wheelchair)?: Total Help needed walking in hospital room?: Total Help needed climbing 3-5 steps with a railing? : Total 6 Click Score: 6    End of Session Equipment Utilized During Treatment: Gait belt Activity Tolerance: Patient limited by pain Patient left: in bed;with call bell/phone within reach;with bed alarm set;with SCD's reapplied Nurse Communication: Mobility status PT Visit Diagnosis: History of falling (Z91.81);Other abnormalities of gait and mobility (R26.89);Muscle weakness (generalized) (M62.81)      Time: 1434-1500 PT Time Calculation (min) (ACUTE ONLY): 26 min  Charges:  $Therapeutic Exercise: 8-22 mins $Therapeutic Activity: 8-22 mins                    G Codes:      DRoyetta Asal PT, DPT 01/19/18, 3:18 PM

## 2018-01-19 NOTE — Plan of Care (Signed)
  Clinical Measurements: Ability to maintain clinical measurements within normal limits will improve 01/19/2018 0424 - Progressing by Lavere Shinsky, Lucille Passy, RN   Activity: Risk for activity intolerance will decrease 01/19/2018 0424 - Progressing by Merriam Brandner, Lucille Passy, RN   Pain Managment: General experience of comfort will improve 01/19/2018 0424 - Progressing by Yahye Siebert, Lucille Passy, RN   Safety: Ability to remain free from injury will improve 01/19/2018 0424 - Progressing by Giannie Soliday, Lucille Passy, RN   Skin Integrity: Risk for impaired skin integrity will decrease 01/19/2018 0424 - Progressing by Rynn Markiewicz, Lucille Passy, RN   Education: Verbalization of understanding the information provided (i.e., activity precautions, restrictions, etc) will improve 01/19/2018 0424 - Progressing by Karin Griffith, Lucille Passy, RN

## 2018-01-19 NOTE — Progress Notes (Signed)
[facility] [place] NAME: [name]    MR#:  [id number]  DATE OF BIRTH:  1934/04/03  SUBJECTIVE:  Came in after mechanical fall at [facility] POD #1 Trying to work with PT  REVIEW OF SYSTEMS:   Review of Systems  Constitutional: Negative for chills, fever and weight loss.  HENT: Negative for ear discharge, ear pain and nosebleeds.   Eyes: Negative for blurred vision, pain and discharge.  Respiratory: Negative for sputum production, shortness of breath, wheezing and stridor.   Cardiovascular: Negative for chest pain, palpitations, orthopnea and PND.  Gastrointestinal: Negative for abdominal pain, diarrhea, nausea and vomiting.  Genitourinary: Negative for frequency and urgency.  Musculoskeletal: Positive for falls and joint pain. Negative for back pain.  Neurological: Positive for weakness. Negative for sensory change, speech change and focal weakness.  Psychiatric/Behavioral: Negative for depression and hallucinations. The patient is not nervous/anxious.    Tolerating Diet: regular Tolerating PT: pending  DRUG ALLERGIES:  No Known Allergies  VITALS:  Blood pressure 134/60, pulse 96, temperature 97.6 F (36.4 C), temperature source Oral, resp. rate 16, height 5\' 5"  (1.651 m), weight 53.5 kg (118 lb), SpO2 95 %.  PHYSICAL EXAMINATION:   Physical Exam  GENERAL:  84 y.o.-year-old patient lying in the bed with no acute distress.  EYES: Pupils equal, round, reactive to light and accommodation. No scleral icterus. Extraocular muscles intact.  HEENT: Head atraumatic, normocephalic. Oropharynx and nasopharynx clear.  NECK:  Supple, no jugular venous distention. No thyroid enlargement, no tenderness.  LUNGS: Normal breath sounds bilaterally, no wheezing, rales, rhonchi. No use of accessory muscles of respiration.  CARDIOVASCULAR: S1, S2 normal. No murmurs, rubs, or gallops.  ABDOMEN: Soft, nontender, nondistended. Bowel sounds  present. No organomegaly or mass.  EXTREMITIES: No cyanosis, clubbing or edema b/l.   Decreased right LE rom due to ftracture NEUROLOGIC: Cranial nerves II through XII are intact. No focal Motor or sensory deficits b/l.   PSYCHIATRIC:  patient is alert and oriented x 3.  SKIN: No obvious rash, lesion, or ulcer.   LABORATORY PANEL:  CBC Recent Labs  Lab 01/18/18 0312  WBC 8.8  HGB 10.6*  HCT 31.8*  PLT 120*    Chemistries  Recent Labs  Lab 01/19/18 0541  NA 140  K 3.9  CL 107  CO2 26  GLUCOSE 132*  BUN 17  CREATININE 0.44  CALCIUM 7.9*  AST 106*  ALT 109*  ALKPHOS 80  BILITOT 0.6   Cardiac Enzymes Recent Labs  Lab 01/17/18 2238  TROPONINI <0.03   RADIOLOGY:  Ct Head Wo Contrast  Result Date: 01/17/2018 CLINICAL DATA:  Status post unwitnessed fall, with concern for head or cervical spine injury. EXAM: CT HEAD WITHOUT CONTRAST CT CERVICAL SPINE WITHOUT CONTRAST TECHNIQUE: Multidetector CT imaging of the head and cervical spine was performed following the standard protocol without intravenous contrast. Multiplanar CT image reconstructions of the cervical spine were also generated. COMPARISON:  CT of the head and cervical spine performed 12/26/2017 FINDINGS: CT HEAD FINDINGS Brain: No evidence of acute infarction, hemorrhage, hydrocephalus, extra-axial collection or mass lesion / mass effect. Prominence of the ventricles and sulci reflects moderate cortical volume loss. Mild cerebellar atrophy is noted. Diffuse periventricular and subcortical white matter change likely reflects small vessel ischemic microangiopathy. The brainstem and fourth ventricle are within normal limits. The basal ganglia are unremarkable in appearance. The cerebral hemispheres demonstrate grossly normal gray-white differentiation. No mass effect or midline shift  is seen. Vascular: No hyperdense vessel or unexpected calcification. Skull: There is no evidence of fracture; visualized osseous structures are  unremarkable in appearance. Sinuses/Orbits: The visualized portions of the orbits are within normal limits. The patient is status post left-sided mastoidectomy. The paranasal sinuses and right mastoid air cells are well-aerated. Other: No significant soft tissue abnormalities are seen. CT CERVICAL SPINE FINDINGS Alignment: Normal. Skull base and vertebrae: No acute fracture. No primary bone lesion or focal pathologic process. There is incomplete fusion of the posterior arch of C1. Soft tissues and spinal canal: No prevertebral fluid or swelling. No visible canal hematoma. Disc levels: Mild multilevel disc space narrowing is noted along the cervical spine, with scattered small anterior and posterior disc osteophyte complexes. Upper chest: The visualized lung apices are clear. The visualized portions of the thyroid gland are unremarkable. Calcification is noted at the carotid bifurcations bilaterally. Other: No additional soft tissue abnormalities are seen. IMPRESSION: 1. No evidence of traumatic intracranial injury or fracture. 2. No evidence of fracture or subluxation along the cervical spine. 3. Moderate cortical volume loss and diffuse small vessel ischemic microangiopathy. 4. Mild degenerative change along the cervical spine. 5. Calcification at the carotid bifurcations bilaterally. Carotid ultrasound would be helpful for further evaluation, when and as deemed clinically appropriate. Electronically Signed   By: Garald Balding M.D.   On: 01/17/2018 23:16   Ct Cervical Spine Wo Contrast  Result Date: 01/17/2018 CLINICAL DATA:  Status post unwitnessed fall, with concern for head or cervical spine injury. EXAM: CT HEAD WITHOUT CONTRAST CT CERVICAL SPINE WITHOUT CONTRAST TECHNIQUE: Multidetector CT imaging of the head and cervical spine was performed following the standard protocol without intravenous contrast. Multiplanar CT image reconstructions of the cervical spine were also generated. COMPARISON:  CT of the  head and cervical spine performed 12/26/2017 FINDINGS: CT HEAD FINDINGS Brain: No evidence of acute infarction, hemorrhage, hydrocephalus, extra-axial collection or mass lesion / mass effect. Prominence of the ventricles and sulci reflects moderate cortical volume loss. Mild cerebellar atrophy is noted. Diffuse periventricular and subcortical white matter change likely reflects small vessel ischemic microangiopathy. The brainstem and fourth ventricle are within normal limits. The basal ganglia are unremarkable in appearance. The cerebral hemispheres demonstrate grossly normal gray-white differentiation. No mass effect or midline shift is seen. Vascular: No hyperdense vessel or unexpected calcification. Skull: There is no evidence of fracture; visualized osseous structures are unremarkable in appearance. Sinuses/Orbits: The visualized portions of the orbits are within normal limits. The patient is status post left-sided mastoidectomy. The paranasal sinuses and right mastoid air cells are well-aerated. Other: No significant soft tissue abnormalities are seen. CT CERVICAL SPINE FINDINGS Alignment: Normal. Skull base and vertebrae: No acute fracture. No primary bone lesion or focal pathologic process. There is incomplete fusion of the posterior arch of C1. Soft tissues and spinal canal: No prevertebral fluid or swelling. No visible canal hematoma. Disc levels: Mild multilevel disc space narrowing is noted along the cervical spine, with scattered small anterior and posterior disc osteophyte complexes. Upper chest: The visualized lung apices are clear. The visualized portions of the thyroid gland are unremarkable. Calcification is noted at the carotid bifurcations bilaterally. Other: No additional soft tissue abnormalities are seen. IMPRESSION: 1. No evidence of traumatic intracranial injury or fracture. 2. No evidence of fracture or subluxation along the cervical spine. 3. Moderate cortical volume loss and diffuse small  vessel ischemic microangiopathy. 4. Mild degenerative change along the cervical spine. 5. Calcification at the carotid bifurcations bilaterally. Carotid  ultrasound would be helpful for further evaluation, when and as deemed clinically appropriate. Electronically Signed   By: Garald Balding M.D.   On: 01/17/2018 23:16   Dg Chest Portable 1 View  Result Date: 01/17/2018 CLINICAL DATA:  Status post unwitnessed fall while walking to restroom, with concern for chest injury. Initial encounter. EXAM: PORTABLE CHEST 1 VIEW COMPARISON:  Chest radiograph performed 06/04/2017 FINDINGS: The lungs are well-aerated and clear. There is no evidence of focal opacification, pleural effusion or pneumothorax. The cardiomediastinal silhouette is within normal limits. No acute osseous abnormalities are seen. The liver is unremarkable in appearance. The patient is status post cholecystectomy, with clips noted at the gallbladder fossa. The common bile duct remains normal in caliber. IMPRESSION: No acute cardiopulmonary process seen. No displaced rib fractures identified. Electronically Signed   By: Garald Balding M.D.   On: 01/17/2018 23:34   Dg Hip Operative Unilat W Or W/o Pelvis Right  Result Date: 01/18/2018 CLINICAL DATA:  Right hip fracture EXAM: OPERATIVE right  HIP (WITH PELVIS IF PERFORMED) 8 VIEWS TECHNIQUE: Fluoroscopic spot image(s) were submitted for interpretation post-operatively. COMPARISON:  Radiograph 01/17/2018 FINDINGS: Eight low resolution intraoperative spot views of the right hip. Total fluoroscopy time was 30 seconds. Images were obtained during internal fixation of right intertrochanteric fracture. IMPRESSION: Intraoperative fluoroscopic assistance provided during surgical fixation of proximal right femur fracture Electronically Signed   By: Donavan Foil M.D.   On: 01/18/2018 20:35   Dg Hip Unilat W Or Wo Pelvis 2-3 Views Right  Result Date: 01/17/2018 CLINICAL DATA:  Status post unwitnessed fall, with  shortening and rotation at the right leg. Initial encounter. EXAM: DG HIP (WITH OR WITHOUT PELVIS) 2-3V RIGHT COMPARISON:  None. FINDINGS: There is a comminuted and mildly displaced right femoral intertrochanteric fracture, with a displaced lesser trochanteric fragment. The right femoral head remains seated at the acetabulum. The left hip joint is unremarkable in appearance. There is chronic deformity about the left-sided pubic rami. The sacroiliac joints are grossly unremarkable. The visualized bowel gas pattern is unremarkable. IMPRESSION: Comminuted and mildly displaced right femoral intertrochanteric fracture, with a displaced lesser trochanteric fragment. Electronically Signed   By: Garald Balding M.D.   On: 01/17/2018 23:33   ASSESSMENT AND PLAN:   [name]  is a 84 y.o. female with a known history of Alzheimer's dementia, hypertension and thrombocytopenia. Patient was brought to emergency room status post mechanical fall at her assisted living facility, that resulted in right hip fracture.  Patient does not recall the details of her fall, but apparently she was walking without a walker on on the hallway and lost her balance.  1.  Acute right femoral intertrochanteric fracture, status post mechanical fall. -Orthopedic consultation with Dr Harlow Mares appreciated -POD #1  INTRAMEDULLARY (IM) NAIL INTERTROCHANTRIC (TFNA), RIGHT -Continue pain control. -pt has no cardiac history other than EKG showing RBBB  2.  Alzheimer's dementia, stable -cont home medications.  3.  Thrombocytopenia, stable, platelet count is currently 120.  - We will continue to monitor closely.    4.DVT prophlylaxis On lovenox   d/w dter in the room  Case discussed with Care Management/Social Worker. Management plans discussed with the patient, family and they are in agreement.  CODE STATUS: DNR  DVT Prophylaxis: lovenox TOTAL TIME TAKING CARE OF THIS PATIENT: *25* minutes.  >50% time spent on counselling and  coordination of care  POSSIBLE D/C IN 1-2 DAYS, DEPENDING ON CLINICAL CONDITION.  Note: This dictation was prepared with Dragon dictation  along with smaller phrase technology. Any transcriptional errors that result from this process are unintentional.  Fritzi Mandes M.D on 01/19/2018 at 9:54 AM  Between 7am to 6pm - Pager - [phone]  After 6pm go to [email or web address] - password [email or web address] [facility]  Office  [phone]  CC: Primary care physician; Patient, No Pcp PerPatient ID: [name], female   DOB: December 09, 1934, 84 y.o.   MRN: [id number]

## 2018-01-19 NOTE — Progress Notes (Signed)
Social work intern met with patient to present bed offers. Patient appeared confused and asked social work intern to call her daughter Carrie. Clinical Social Worker (CSW) contacted patient's daughter Carrie and presented bed offers. Daughter chose [facility]. CSW left Health Team case manager a voicemail making her aware of above. Michelle admissions coordinator at [facility] is aware of above.    , LCSW [phone] 

## 2018-01-19 NOTE — Evaluation (Signed)
Physical Therapy Evaluation Patient Details Name: [name] MRN: [id number] DOB: 1934/05/21 Today's Date: 01/19/2018   History of Present Illness  Pt is an83 y.o.femalewith a known history of Alzheimer's dementia, hypertension and thrombocytopenia.  Patient was brought to emergency room status post mechanical fall at her assisted living facility,that resulted in right hip fracture. Patient does not recall the details of her fall,but apparently she was walking without a walker in the hallway and lost her balance.Patient denied having chest pain,palpitations or dizziness before the fall. Pelvis x-ray done in the emergency room showedcomminuted and mildly displaced right femoral intertrochanteric fracture, with a displaced lesser trochanteric fragment.CT of the brain and cervical spine was negative for any acute findings.  Pt is now s/p [name].      Clinical Impression  Pt presents with deficits in strength, transfers, mobility, gait, balance, and activity tolerance.  Pt required max A and extensive verbal and tactile cues for sequencing with all bed mobility tasks.  Pt required mod A with sit to/from stand transfers again with extensive cues for sequencing.  Pt was close to being able to pick her R foot off of the floor but not completely and was unable to even attempt to advance her L foot secondary to R hip pain with WB.  Pt unable to stand fully upright and relied heavily on BUE support on RW to maintain standing position.  Pt will benefit from PT services in a SNF setting upon discharge to safely address above deficits for decreased caregiver assistance and eventual return to PLOF.      Follow Up Recommendations SNF    Equipment Recommendations  None recommended by PT    Recommendations for Other Services       Precautions / Restrictions Precautions Precautions: Fall Restrictions Weight Bearing Restrictions: Yes RLE Weight Bearing: Weight bearing as tolerated       Mobility  Bed Mobility Overal bed mobility: Needs Assistance Bed Mobility: Supine to Sit;Sit to Supine     Supine to sit: Max assist Sit to supine: Max assist   General bed mobility comments: Max A for BLEs and trunk in and out of bed  Transfers Overall transfer level: Needs assistance Equipment used: Rolling walker (2 wheeled) Transfers: Sit to/from Stand Sit to Stand: Mod assist         General transfer comment: Pt unable to stand fully upright but required only CGA once in standing  Ambulation/Gait             General Gait Details: Pt able to unweight RLE from the floor but unable to lift foot completely off of the floor.  Pt unable to unweight LLE at all secondary to R hip pain.    Stairs            Wheelchair Mobility    Modified Rankin (Stroke Patients Only)       Balance Overall balance assessment: Needs assistance Sitting-balance support: Feet supported;Bilateral upper extremity supported Sitting balance-Leahy Scale: Fair     Standing balance support: Bilateral upper extremity supported Standing balance-Leahy Scale: Poor Standing balance comment: Heavy reliance on BUEs on RW and unable to stand fully upright.                             Pertinent Vitals/Pain Pain Assessment: 0-10 Pain Score: 0-No pain Pain Location: No pain at rest but "hurts a lot" to the R hip with RLE movement Pain Descriptors /  Indicators: Grimacing;Moaning Pain Intervention(s): Limited activity within patient's tolerance;Premedicated before session;Monitored during session    [place] expects to be discharged to:: Assisted living([name] memory [facility]; history from daughter [name] by phone)               Home Equipment: Gilford Rile - 2 wheels      Prior Function Level of Independence: Needs assistance   Gait / Transfers Assistance Needed: Mod Ind with amb facility distances at [place], memory care unit; extensive  fall history per daughter  ADL's / Homemaking Assistance Needed: Staff assists with all ADLs with patient able to feed herself        Hand Dominance   Dominant Hand: Right    Extremity/Trunk Assessment   Upper Extremity Assessment Upper Extremity Assessment: Overall WFL for tasks assessed    Lower Extremity Assessment Lower Extremity Assessment: Generalized weakness;RLE deficits/detail RLE: Unable to fully assess due to pain       Communication   Communication: No difficulties  Cognition Arousal/Alertness: Awake/alert Behavior During Therapy: WFL for tasks assessed/performed Overall Cognitive Status: No family/caregiver present to determine baseline cognitive functioning                                        General Comments      Exercises Total Joint Exercises Ankle Circles/Pumps: AROM;Both;10 reps(Extensive verbal and tactile cues for participation/technique during therex) Quad Sets: AAROM;Both;10 reps Short Arc QuadSinclair Carter;Both;10 reps Hip ABduction/ADduction: AAROM;Both;10 reps Straight Leg Raises: AAROM;Both;10 reps Long Arc Quad: AAROM;AROM;Both;10 reps   Assessment/Plan    PT Assessment Patient needs continued PT services  PT Problem List Decreased strength;Decreased activity tolerance;Decreased balance;Decreased mobility       PT Treatment Interventions DME instruction;Gait training;Functional mobility training;Balance training;Therapeutic exercise;Therapeutic activities;Patient/family education    PT Goals (Current goals can be found in the Care Plan section)  Acute Rehab PT Goals PT Goal Formulation: Patient unable to participate in goal setting Time For Goal Achievement: 02/01/18 Potential to Achieve Goals: Fair    Frequency BID   Barriers to discharge Decreased caregiver support      Co-evaluation               AM-PAC PT "6 Clicks" Daily Activity  Outcome Measure Difficulty turning over in bed (including adjusting  bedclothes, sheets and blankets)?: Unable Difficulty moving from lying on back to sitting on the side of the bed? : Unable Difficulty sitting down on and standing up from a chair with arms (e.g., wheelchair, bedside commode, etc,.)?: Unable Help needed moving to and from a bed to chair (including a wheelchair)?: Total Help needed walking in hospital room?: Total Help needed climbing 3-5 steps with a railing? : Total 6 Click Score: 6    End of Session Equipment Utilized During Treatment: Gait belt Activity Tolerance: No increased pain Patient left: in bed;with call bell/phone within reach;with bed alarm set;with SCD's reapplied Nurse Communication: Mobility status PT Visit Diagnosis: History of falling (Z91.81);Other abnormalities of gait and mobility (R26.89);Muscle weakness (generalized) (M62.81)    Time: 0930-1010 PT Time Calculation (min) (ACUTE ONLY): 40 min   Charges:   PT Evaluation $PT Eval Low Complexity: 1 Low PT Treatments $Therapeutic Exercise: 8-22 mins   PT G Codes:        DRoyetta Asal PT, DPT 01/19/18, 1:28 PM

## 2018-01-19 NOTE — Progress Notes (Signed)
Patient pulled out IV, new one inserted

## 2018-01-19 NOTE — Anesthesia Postprocedure Evaluation (Signed)
Anesthesia Post Note  Patient: [name]  Procedure(s) Performed: INTRAMEDULLARY (IM) NAIL INTERTROCHANTRIC (TFNA) (Right Hip)  Patient location during evaluation: Nursing Unit Anesthesia Type: Spinal Level of consciousness: awake and alert and confused (baseline demetia) Pain management: pain level controlled Vital Signs Assessment: post-procedure vital signs reviewed and stable Respiratory status: spontaneous breathing and respiratory function stable Cardiovascular status: blood pressure returned to baseline and stable Postop Assessment: no headache, no backache, no apparent nausea or vomiting and spinal receding Anesthetic complications: no     Last Vitals:  Vitals:   01/19/18 0034 01/19/18 0426  BP: (!) 160/57 134/60  Pulse: (!) 101 96  Resp: 16 16  Temp: 37.5 C 36.4 C  SpO2: 94% 95%    Last Pain:  Vitals:   01/19/18 0426  TempSrc: Oral  PainSc:                  Brantley Fling

## 2018-01-19 NOTE — Progress Notes (Signed)
Subjective:  Patient reports pain as mild.    Objective:   VITALS:   Vitals:   01/18/18 2213 01/19/18 0034 01/19/18 0426 01/19/18 0445  BP: (!) 105/45 (!) 160/57 134/60   Pulse: 99 (!) 101 96   Resp: 16 16 16    Temp: 99.4 F (37.4 C) 99.5 F (37.5 C) 97.6 F (36.4 C)   TempSrc: Oral Oral Oral   SpO2: 93% 94% 95%   Weight:    53.5 kg (118 lb)  Height:        PHYSICAL EXAM:  Dorsiflexion/Plantar flexion intact Incision: dressing C/D/I Compartment soft  LABS  Results for orders placed or performed during the hospital encounter of 01/17/18 (from the past 24 hour(s))  Comprehensive metabolic panel     Status: Abnormal   Collection Time: 01/19/18  5:41 AM  Result Value Ref Range   Sodium 140 135 - 145 mmol/L   Potassium 3.9 3.5 - 5.1 mmol/L   Chloride 107 101 - 111 mmol/L   CO2 26 22 - 32 mmol/L   Glucose, Bld 132 (H) 65 - 99 mg/dL   BUN 17 6 - 20 mg/dL   Creatinine, Ser 0.44 0.44 - 1.00 mg/dL   Calcium 7.9 (L) 8.9 - 10.3 mg/dL   Total Protein 4.9 (L) 6.5 - 8.1 g/dL   Albumin 2.8 (L) 3.5 - 5.0 g/dL   AST 106 (H) 15 - 41 U/L   ALT 109 (H) 14 - 54 U/L   Alkaline Phosphatase 80 38 - 126 U/L   Total Bilirubin 0.6 0.3 - 1.2 mg/dL   GFR calc non Af Amer >60 >60 mL/min   GFR calc Af Amer >60 >60 mL/min   Anion gap 7 5 - 15  Glucose, capillary     Status: Abnormal   Collection Time: 01/19/18  7:38 AM  Result Value Ref Range   Glucose-Capillary 116 (H) 65 - 99 mg/dL    Ct Head Wo Contrast  Result Date: 01/17/2018 CLINICAL DATA:  Status post unwitnessed fall, with concern for head or cervical spine injury. EXAM: CT HEAD WITHOUT CONTRAST CT CERVICAL SPINE WITHOUT CONTRAST TECHNIQUE: Multidetector CT imaging of the head and cervical spine was performed following the standard protocol without intravenous contrast. Multiplanar CT image reconstructions of the cervical spine were also generated. COMPARISON:  CT of the head and cervical spine performed 12/26/2017 FINDINGS: CT  HEAD FINDINGS Brain: No evidence of acute infarction, hemorrhage, hydrocephalus, extra-axial collection or mass lesion / mass effect. Prominence of the ventricles and sulci reflects moderate cortical volume loss. Mild cerebellar atrophy is noted. Diffuse periventricular and subcortical white matter change likely reflects small vessel ischemic microangiopathy. The brainstem and fourth ventricle are within normal limits. The basal ganglia are unremarkable in appearance. The cerebral hemispheres demonstrate grossly normal gray-white differentiation. No mass effect or midline shift is seen. Vascular: No hyperdense vessel or unexpected calcification. Skull: There is no evidence of fracture; visualized osseous structures are unremarkable in appearance. Sinuses/Orbits: The visualized portions of the orbits are within normal limits. The patient is status post left-sided mastoidectomy. The paranasal sinuses and right mastoid air cells are well-aerated. Other: No significant soft tissue abnormalities are seen. CT CERVICAL SPINE FINDINGS Alignment: Normal. Skull base and vertebrae: No acute fracture. No primary bone lesion or focal pathologic process. There is incomplete fusion of the posterior arch of C1. Soft tissues and spinal canal: No prevertebral fluid or swelling. No visible canal hematoma. Disc levels: Mild multilevel disc space narrowing is noted along the  cervical spine, with scattered small anterior and posterior disc osteophyte complexes. Upper chest: The visualized lung apices are clear. The visualized portions of the thyroid gland are unremarkable. Calcification is noted at the carotid bifurcations bilaterally. Other: No additional soft tissue abnormalities are seen. IMPRESSION: 1. No evidence of traumatic intracranial injury or fracture. 2. No evidence of fracture or subluxation along the cervical spine. 3. Moderate cortical volume loss and diffuse small vessel ischemic microangiopathy. 4. Mild degenerative  change along the cervical spine. 5. Calcification at the carotid bifurcations bilaterally. Carotid ultrasound would be helpful for further evaluation, when and as deemed clinically appropriate. Electronically Signed   By: Garald Balding M.D.   On: 01/17/2018 23:16   Ct Cervical Spine Wo Contrast  Result Date: 01/17/2018 CLINICAL DATA:  Status post unwitnessed fall, with concern for head or cervical spine injury. EXAM: CT HEAD WITHOUT CONTRAST CT CERVICAL SPINE WITHOUT CONTRAST TECHNIQUE: Multidetector CT imaging of the head and cervical spine was performed following the standard protocol without intravenous contrast. Multiplanar CT image reconstructions of the cervical spine were also generated. COMPARISON:  CT of the head and cervical spine performed 12/26/2017 FINDINGS: CT HEAD FINDINGS Brain: No evidence of acute infarction, hemorrhage, hydrocephalus, extra-axial collection or mass lesion / mass effect. Prominence of the ventricles and sulci reflects moderate cortical volume loss. Mild cerebellar atrophy is noted. Diffuse periventricular and subcortical white matter change likely reflects small vessel ischemic microangiopathy. The brainstem and fourth ventricle are within normal limits. The basal ganglia are unremarkable in appearance. The cerebral hemispheres demonstrate grossly normal gray-white differentiation. No mass effect or midline shift is seen. Vascular: No hyperdense vessel or unexpected calcification. Skull: There is no evidence of fracture; visualized osseous structures are unremarkable in appearance. Sinuses/Orbits: The visualized portions of the orbits are within normal limits. The patient is status post left-sided mastoidectomy. The paranasal sinuses and right mastoid air cells are well-aerated. Other: No significant soft tissue abnormalities are seen. CT CERVICAL SPINE FINDINGS Alignment: Normal. Skull base and vertebrae: No acute fracture. No primary bone lesion or focal pathologic process.  There is incomplete fusion of the posterior arch of C1. Soft tissues and spinal canal: No prevertebral fluid or swelling. No visible canal hematoma. Disc levels: Mild multilevel disc space narrowing is noted along the cervical spine, with scattered small anterior and posterior disc osteophyte complexes. Upper chest: The visualized lung apices are clear. The visualized portions of the thyroid gland are unremarkable. Calcification is noted at the carotid bifurcations bilaterally. Other: No additional soft tissue abnormalities are seen. IMPRESSION: 1. No evidence of traumatic intracranial injury or fracture. 2. No evidence of fracture or subluxation along the cervical spine. 3. Moderate cortical volume loss and diffuse small vessel ischemic microangiopathy. 4. Mild degenerative change along the cervical spine. 5. Calcification at the carotid bifurcations bilaterally. Carotid ultrasound would be helpful for further evaluation, when and as deemed clinically appropriate. Electronically Signed   By: Garald Balding M.D.   On: 01/17/2018 23:16   Dg Chest Portable 1 View  Result Date: 01/17/2018 CLINICAL DATA:  Status post unwitnessed fall while walking to restroom, with concern for chest injury. Initial encounter. EXAM: PORTABLE CHEST 1 VIEW COMPARISON:  Chest radiograph performed 06/04/2017 FINDINGS: The lungs are well-aerated and clear. There is no evidence of focal opacification, pleural effusion or pneumothorax. The cardiomediastinal silhouette is within normal limits. No acute osseous abnormalities are seen. The liver is unremarkable in appearance. The patient is status post cholecystectomy, with clips noted at the gallbladder  fossa. The common bile duct remains normal in caliber. IMPRESSION: No acute cardiopulmonary process seen. No displaced rib fractures identified. Electronically Signed   By: Garald Balding M.D.   On: 01/17/2018 23:34   Dg Hip Operative Unilat W Or W/o Pelvis Right  Result Date:  01/18/2018 CLINICAL DATA:  Right hip fracture EXAM: OPERATIVE right  HIP (WITH PELVIS IF PERFORMED) 8 VIEWS TECHNIQUE: Fluoroscopic spot image(s) were submitted for interpretation post-operatively. COMPARISON:  Radiograph 01/17/2018 FINDINGS: Eight low resolution intraoperative spot views of the right hip. Total fluoroscopy time was 30 seconds. Images were obtained during internal fixation of right intertrochanteric fracture. IMPRESSION: Intraoperative fluoroscopic assistance provided during surgical fixation of proximal right femur fracture Electronically Signed   By: Donavan Foil M.D.   On: 01/18/2018 20:35   Dg Hip Unilat W Or Wo Pelvis 2-3 Views Right  Result Date: 01/17/2018 CLINICAL DATA:  Status post unwitnessed fall, with shortening and rotation at the right leg. Initial encounter. EXAM: DG HIP (WITH OR WITHOUT PELVIS) 2-3V RIGHT COMPARISON:  None. FINDINGS: There is a comminuted and mildly displaced right femoral intertrochanteric fracture, with a displaced lesser trochanteric fragment. The right femoral head remains seated at the acetabulum. The left hip joint is unremarkable in appearance. There is chronic deformity about the left-sided pubic rami. The sacroiliac joints are grossly unremarkable. The visualized bowel gas pattern is unremarkable. IMPRESSION: Comminuted and mildly displaced right femoral intertrochanteric fracture, with a displaced lesser trochanteric fragment. Electronically Signed   By: Garald Balding M.D.   On: 01/17/2018 23:33    Assessment/Plan: 1 Day Post-Op   Active Problems:   Closed right hip fracture (HCC)   Advance diet Up with therapy Discharge to SNF, she is cleared from an orthopedic standpoint   Lovell Sheehan , MD 01/19/2018, 10:40 AM

## 2018-01-20 ENCOUNTER — Other Ambulatory Visit: Payer: Self-pay

## 2018-01-20 LAB — GLUCOSE, CAPILLARY: Glucose-Capillary: 106 mg/dL — ABNORMAL HIGH (ref 65–99)

## 2018-01-20 LAB — COMPREHENSIVE METABOLIC PANEL
ALBUMIN: 2.6 g/dL — AB (ref 3.5–5.0)
ALK PHOS: 75 U/L (ref 38–126)
ALT: 57 U/L — ABNORMAL HIGH (ref 14–54)
ANION GAP: 8 (ref 5–15)
AST: 44 U/L — AB (ref 15–41)
BILIRUBIN TOTAL: 0.7 mg/dL (ref 0.3–1.2)
BUN: 15 mg/dL (ref 6–20)
CALCIUM: 8.3 mg/dL — AB (ref 8.9–10.3)
CO2: 28 mmol/L (ref 22–32)
Chloride: 108 mmol/L (ref 101–111)
Creatinine, Ser: 0.46 mg/dL (ref 0.44–1.00)
GFR calc Af Amer: >60 mL/min (ref >60)
GFR calc non Af Amer: >60 mL/min (ref >60)
GLUCOSE: 107 mg/dL — AB (ref 65–99)
Potassium: 3.7 mmol/L (ref 3.5–5.1)
Sodium: 144 mmol/L (ref 135–145)
TOTAL PROTEIN: 4.7 g/dL — AB (ref 6.5–8.1)

## 2018-01-20 LAB — ABO/RH: ABO/RH(D): A NEG

## 2018-01-20 LAB — TYPE AND SCREEN
ABO/RH(D): A NEG
Antibody Screen: NEGATIVE

## 2018-01-20 LAB — HEMOGLOBIN
HEMOGLOBIN: 8.4 g/dL — AB (ref 12.0–16.0)
Hemoglobin: 6.8 g/dL — ABNORMAL LOW (ref 12.0–16.0)
Hemoglobin: 6.5 g/dL — ABNORMAL LOW (ref 12.0–16.0)

## 2018-01-20 LAB — PREPARE RBC (CROSSMATCH)

## 2018-01-20 MED ORDER — LORAZEPAM 0.5 MG PO TABS: 0.5000 mg | ORAL_TABLET | Freq: Two times a day (BID) | ORAL | 0 refills | Status: DC

## 2018-01-20 MED ORDER — HYDROCODONE-ACETAMINOPHEN 5-325 MG PO TABS: 1.0000 | ORAL_TABLET | Freq: Four times a day (QID) | ORAL | 0 refills | Status: DC | PRN

## 2018-01-20 MED ORDER — SODIUM CHLORIDE 0.9 % IV SOLN: Freq: Once | INTRAVENOUS | Status: DC

## 2018-01-20 MED ORDER — ENOXAPARIN SODIUM 30 MG/0.3ML ~~LOC~~ SOLN: 30.0000 mg | SUBCUTANEOUS | 0 refills | Status: DC

## 2018-01-20 NOTE — Plan of Care (Signed)
Dr. Cindi Carbon came to round on patient. Hip dressing and staples assessed. Pressure dressing placed to assist with further clotting. Still waiting on blood to be ready to transfuse. Hosp. text about 101.8 temp, diaphoretic and lethargic. (Tylenol given).  RN will continue to assess.

## 2018-01-20 NOTE — Progress Notes (Signed)
Plan is for patient to D/C to [facility] tomorrow pending medical clearance. Per [facility] admissions coordinator at [facility] patient can come to room 207-B. [facility] authorization is pending.   [employer], LCSW [phone]

## 2018-01-20 NOTE — Progress Notes (Signed)
Subjective:  POD #2 s/p TFN for right hip fracture.  Patient with dementia.   Her son is at the bedside.   Patient resting comfortably.  Patient unable to provide history or participate with exam today.  Hgb 6.8 today.  Patient with fever of 101.8.    Objective:   VITALS:   Vitals:   01/19/18 1824 01/19/18 1942 01/20/18 0215 01/20/18 0257  BP: (!) 133/53 (!) 128/38  (!) 127/40  Pulse: 94 92  94  Resp: 16 18  19   Temp: 98 F (36.7 C) 99.3 F (37.4 C)  (!) 97.5 F (36.4 C)  TempSrc: Oral Oral  Oral  SpO2: 94% 94%  93%  Weight:   58.1 kg (128 lb)   Height:        PHYSICAL EXAM: Patient responds to pain with dressing change.   I personally changed the patient's saturated dressings.  Incisions with mild serosanguinous drainage.  No erythema or ecchymosis.  Thigh is swollen, but compartments are soft.   Pedal pulses are palpable.  Can't assess sensation or motor function as patient not following commands.  Patient in no acute distress.  Patient with mild audible wheeze.  No respiratory distress.   LABS  Results for orders placed or performed during the hospital encounter of 01/17/18 (from the past 24 hour(s))  Comprehensive metabolic panel     Status: Abnormal   Collection Time: 01/20/18  3:25 AM  Result Value Ref Range   Sodium 144 135 - 145 mmol/L   Potassium 3.7 3.5 - 5.1 mmol/L   Chloride 108 101 - 111 mmol/L   CO2 28 22 - 32 mmol/L   Glucose, Bld 107 (H) 65 - 99 mg/dL   BUN 15 6 - 20 mg/dL   Creatinine, Ser 0.46 0.44 - 1.00 mg/dL   Calcium 8.3 (L) 8.9 - 10.3 mg/dL   Total Protein 4.7 (L) 6.5 - 8.1 g/dL   Albumin 2.6 (L) 3.5 - 5.0 g/dL   AST 44 (H) 15 - 41 U/L   ALT 57 (H) 14 - 54 U/L   Alkaline Phosphatase 75 38 - 126 U/L   Total Bilirubin 0.7 0.3 - 1.2 mg/dL   GFR calc non Af Amer >60 >60 mL/min   GFR calc Af Amer >60 >60 mL/min   Anion gap 8 5 - 15  Hemoglobin     Status: Abnormal   Collection Time: 01/20/18  3:25 AM  Result Value Ref Range   Hemoglobin 6.8  (L) 12.0 - 16.0 g/dL  ABO/Rh     Status: None   Collection Time: 01/20/18  3:25 AM  Result Value Ref Range   ABO/RH(D)      A NEG Performed at [facility], [place], [place] [address]   Glucose, capillary     Status: Abnormal   Collection Time: 01/20/18  8:11 AM  Result Value Ref Range   Glucose-Capillary 106 (H) 65 - 99 mg/dL   Comment 1 Notify RN   Prepare RBC     Status: None   Collection Time: 01/20/18 10:00 AM  Result Value Ref Range   Order Confirmation      ORDER PROCESSED BY BLOOD BANK Performed at [facility], [address] [place], [place] [address]   Hemoglobin     Status: Abnormal   Collection Time: 01/20/18 10:00 AM  Result Value Ref Range   Hemoglobin 6.5 (L) 12.0 - 16.0 g/dL    Dg Hip Operative Unilat W Or W/o  Pelvis Right  Result Date: 01/18/2018 CLINICAL DATA:  Right hip fracture EXAM: OPERATIVE right  HIP (WITH PELVIS IF PERFORMED) 8 VIEWS TECHNIQUE: Fluoroscopic spot image(s) were submitted for interpretation post-operatively. COMPARISON:  Radiograph 01/17/2018 FINDINGS: Eight low resolution intraoperative spot views of the right hip. Total fluoroscopy time was 30 seconds. Images were obtained during internal fixation of right intertrochanteric fracture. IMPRESSION: Intraoperative fluoroscopic assistance provided during surgical fixation of proximal right femur fracture Electronically Signed   By: Donavan Foil M.D.   On: 01/18/2018 20:35    Assessment/Plan: 2 Days Post-Op   Active Problems:   Closed right hip fracture [facility])  Patient ordered for unit of PRBC.  Discharge on hold.  RN may reinforce dressings prn.   Continue PT as patient can tolerate or participate.  Stable from an orthopaedic standpoint.  Patient's fever will be treated with tylenol.  Hosptialist contacted regarding fever.  Will defer fever work up to hospitalist.    Thornton Park , MD 01/20/2018, 12:57 PM

## 2018-01-20 NOTE — Progress Notes (Signed)
Physical Therapy Treatment Patient Details Name: [name] MRN: [id number] DOB: Sep 14, 1934 Today's Date: 01/20/2018    History of Present Illness Pt is an83 y.o.femalewith a known history of Alzheimer's dementia, hypertension and thrombocytopenia.  Patient was brought to emergency room status post mechanical fall at her assisted living facility,that resulted in right hip fracture. Patient does not recall the details of her fall,but apparently she was walking without a walker in the hallway and lost her balance.Patient denied having chest pain,palpitations or dizziness before the fall. Pelvis x-ray done in the emergency room showedcomminuted and mildly displaced right femoral intertrochanteric fracture, with a displaced lesser trochanteric fragment.CT of the brain and cervical spine was negative for any acute findings.  Pt is now s/p [name].      PT Comments    Pt presents with deficits in strength, transfers, mobility, gait, balance, and activity tolerance.  Pt continues to require extensive assist and cueing for bed mobility tasks and transfers.  Pt able to stand at EOB with with RW and CGA but unable to advance either LE during amb attempt.  Pt showed NAD in standing, sitting, or at when at rest but did c/o minimal pain to the R hip going from sup to/from sit.  Pt will benefit from PT services in a SNF setting upon discharge to safely address above deficits for decreased caregiver assistance and eventual return to PLOF.     Follow Up Recommendations  SNF     Equipment Recommendations  None recommended by PT    Recommendations for Other Services       Precautions / Restrictions Precautions Precautions: Fall Restrictions Weight Bearing Restrictions: Yes RLE Weight Bearing: Weight bearing as tolerated    Mobility  Bed Mobility Overal bed mobility: Needs Assistance Bed Mobility: Supine to Sit;Sit to Supine     Supine to sit: Max assist Sit to supine: Max  assist   General bed mobility comments: Max A for BLEs and trunk in and out of bed  Transfers Overall transfer level: Needs assistance Equipment used: Rolling walker (2 wheeled) Transfers: Sit to/from Stand Sit to Stand: Min assist         General transfer comment: Pt able to stand with min A from elevated EOB and then remain standing with CGA but unable to advance either LE  Ambulation/Gait             General Gait Details: Unable   Stairs            Wheelchair Mobility    Modified Rankin (Stroke Patients Only)       Balance Overall balance assessment: Needs assistance Sitting-balance support: Feet supported;Bilateral upper extremity supported Sitting balance-Leahy Scale: Fair     Standing balance support: Bilateral upper extremity supported Standing balance-Leahy Scale: Poor                              Cognition Arousal/Alertness: Awake/alert Behavior During Therapy: WFL for tasks assessed/performed Overall Cognitive Status: No family/caregiver present to determine baseline cognitive functioning                                        Exercises Total Joint Exercises Ankle Circles/Pumps: AROM;Both;10 reps;15 reps(Verbal and tactile cues required for all therex) Quad Sets: AAROM;Both;10 reps;5 reps Short Arc Quad: AROM;AAROM;Both;10 reps;15 reps Heel Slides: AAROM;Both;5 reps;10  reps(Gentle with limited ROM on the RLE) Hip ABduction/ADduction: AAROM;Both;5 reps;10 reps(Gentle with limited ROM on the RLE) Straight Leg Raises: AAROM;Both;5 reps;10 reps(Gentle with limited ROM on the RLE) Long Arc Quad: AAROM;AROM;Both;10 reps;5 reps    General Comments        Pertinent Vitals/Pain Pain Assessment: No/denies pain    Home Living                      Prior Function            PT Goals (current goals can now be found in the care plan section)      Frequency    BID      PT Plan Current plan  remains appropriate    Co-evaluation              AM-PAC PT "6 Clicks" Daily Activity  Outcome Measure                   End of Session Equipment Utilized During Treatment: Gait belt Activity Tolerance: Patient limited by pain Patient left: in bed;with call bell/phone within reach;with bed alarm set;with SCD's reapplied Nurse Communication: Mobility status PT Visit Diagnosis: History of falling (Z91.81);Other abnormalities of gait and mobility (R26.89);Muscle weakness (generalized) (M62.81)     Time: 9379-0240 PT Time Calculation (min) (ACUTE ONLY): 23 min  Charges:  $Therapeutic Exercise: 8-22 mins $Therapeutic Activity: 8-22 mins                    G Codes:       DRoyetta Asal PT, DPT 01/20/18, 1:38 PM

## 2018-01-20 NOTE — Progress Notes (Signed)
[facility] SNF authorization has been received for 4 days, authorization # [id number]. [facility] admissions coordinator at [facility] is aware of above. Patient's daughter [name] is aware of above.   [employer], LCSW [phone]

## 2018-01-20 NOTE — Plan of Care (Signed)
  Clinical Measurements: Ability to maintain clinical measurements within normal limits will improve 01/20/2018 0439 - Progressing by Shawn Dannenberg, Lucille Passy, RN   Activity: Risk for activity intolerance will decrease 01/20/2018 0439 - Progressing by Machel Violante, Lucille Passy, RN   Pain Managment: General experience of comfort will improve 01/20/2018 0439 - Progressing by Rosealynn Mateus, Lucille Passy, RN   Safety: Ability to remain free from injury will improve 01/20/2018 0439 - Progressing by Analys Ryden, Lucille Passy, RN   Skin Integrity: Risk for impaired skin integrity will decrease 01/20/2018 0439 - Progressing by Iram Astorino, Lucille Passy, RN   Education: Verbalization of understanding the information provided (i.e., activity precautions, restrictions, etc) will improve 01/20/2018 0439 - Progressing by Atiyah Bauer, Lucille Passy, RN

## 2018-01-20 NOTE — Plan of Care (Signed)
Dr. Text to inform of hemoglobin 6.8.

## 2018-01-20 NOTE — Care Management Important Message (Signed)
Important Message  Patient Details  Name: MAESON LOURENCO MRN: [id number] Date of Birth: September 30, 1934   Medicare Important Message Given:  N/A - LOS <3 / Initial given by admissions    Beverly Sessions, RN 01/20/2018, 12:10 PM

## 2018-01-20 NOTE — Telephone Encounter (Signed)
Rx sent to [facility] phone : [phone] , fax : [phone]  

## 2018-01-20 NOTE — Plan of Care (Signed)
Dr. Harlow Mares notified via voicemail of patient removing dressing yesterday and causing OR site to bleed. Dressing was replaced, but appears to be stikll bleeding under dressings. Asked to assess during rounds or contact with further orders.

## 2018-01-20 NOTE — Clinical Social Work Note (Addendum)
CSW spoke to [facility] and she said the medical director is still reviewing patient's information.  CSW sent updated PT notes to [facility].  CSW was informed that insurance has been approved for patient to go to [facility] on Saturday if medically ready for discharge and orders have been received.  Jones Broom. [place], MSW, [place]  01/20/2018 2:22 PM

## 2018-01-20 NOTE — Progress Notes (Signed)
Patient ID: [name], female   DOB: [birth year], 84 y.o.   MRN: [id number] hgb 6.8 post op anemia will transfuse one unit BT today and check hgb thereafter Spoke with dter [name] and she is ok with blood transfusion

## 2018-01-20 NOTE — Discharge Summary (Addendum)
[facility] [place] NAME: [name]    MR#:  [id number]  DATE OF BIRTH:  [birth year]  DATE OF ADMISSION:  01/17/2018 ADMITTING PHYSICIAN: Amelia Jo, MD  DATE OF DISCHARGE:   PRIMARY CARE PHYSICIAN: Patient, No Pcp Per    ADMISSION DIAGNOSIS:  Fall, initial encounter [W19.XXXA] Closed fracture of right hip, initial encounter ([place]) [S72.001A]  DISCHARGE DIAGNOSIS:  Close fracture of the right hip status post intramedullary nail intertrochanteric Dr. Harlow Mares  SECONDARY DIAGNOSIS:   Past Medical History:  Diagnosis Date  . Alzheimer's dementia, late onset   . Depression   . Dizziness   . Heart murmur   . Hx of migraine headaches   . Hypertension   . RBBB   . Thrombocytopenia ([place])   . Vitamin D deficiency disease     HOSPITAL COURSE:   [name] 84 y.o.femalewith a known history of Alzheimer's dementia, hypertension and thrombocytopenia. Patient was brought to emergency room status post mechanical fall at her assisted living facility,that resulted in right hip fracture.Patient does not recall the details of her fall,but apparently she was walking without a walker on on the hallway and lost her balance.  1.Acute right femoral intertrochanteric fracture,status post mechanical fall. -Orthopedic consultation with Dr Harlow Mares appreciated -POD #2 INTRAMEDULLARY (IM) NAIL INTERTROCHANTRIC (TFNA), RIGHT -Continue pain control. -pt has no cardiac history other than EKG showing RBBB -Hemoglobin stable at 10  2.Alzheimer's dementia,stable -cont home medications.  3.Thrombocytopenia,stable,platelet count is currently 120.  4. Post op anenia due to surgery -10.6--6.8--1 unit BT today --hgb  5.DVT prophlylaxis On lovenox for 14 days  Will repeat hgb later today and if stable and insurance auth obtain consider d/c to rehab. CSW and pt's dter [name] updated. CONSULTS OBTAINED:  Treatment Team:   Lovell Sheehan, MD  DRUG ALLERGIES:  No Known Allergies  DISCHARGE MEDICATIONS:   Allergies as of 01/20/2018   No Known Allergies     Medication List    TAKE these medications   acetaminophen 325 MG tablet Commonly known as:  TYLENOL Take 325 mg by mouth every 6 (six) hours as needed.   buPROPion 300 MG 24 hr tablet Commonly known as:  WELLBUTRIN XL Take 1 tablet (300 mg total) by mouth daily.   divalproex 250 MG DR tablet Commonly known as:  DEPAKOTE Take 1 tablet (250 mg total) by mouth 2 (two) times daily.   donepezil 10 MG tablet Commonly known as:  ARICEPT Take 1 tablet (10 mg total) by mouth at bedtime.   enoxaparin 30 MG/0.3ML injection Commonly known as:  LOVENOX Inject 0.3 mLs (30 mg total) into the skin daily.   HYDROcodone-acetaminophen 5-325 MG tablet Commonly known as:  NORCO/VICODIN Take 1 tablet by mouth every 6 (six) hours as needed for moderate pain.   LORazepam 0.5 MG tablet Commonly known as:  ATIVAN Take 0.5 mg by mouth 2 (two) times daily. TAKE 1 TABLET BY MOUTH TWICE DAILY AS NEEDED FOR ANXIETY/AGITATION (CONTROL)   mirtazapine 7.5 MG tablet Commonly known as:  REMERON Take 7.5 mg by mouth at bedtime.   vitamin B-12 500 MCG tablet Commonly known as:  CYANOCOBALAMIN Take 500 mcg by mouth daily.       If you experience worsening of your admission symptoms, develop shortness of breath, life threatening emergency, suicidal or homicidal thoughts you must seek medical attention immediately by calling 911 or calling your MD immediately  if symptoms less severe.  You Must read complete  instructions/literature along with all the possible adverse reactions/side effects for all the Medicines you take and that have been prescribed to you. Take any new Medicines after you have completely understood and accept all the possible adverse reactions/side effects.   Please note  You were cared for by a hospitalist during your hospital stay. If you have  any questions about your discharge medications or the care you received while you were in the hospital after you are discharged, you can call the unit and asked to speak with the hospitalist on call if the hospitalist that took care of you is not available. Once you are discharged, your primary care physician will handle any further medical issues. Please note that NO REFILLS for any discharge medications will be authorized once you are discharged, as it is imperative that you return to your primary care physician (or establish a relationship with a primary care physician if you do not have one) for your aftercare needs so that they can reassess your need for medications and monitor your lab values. Today   SUBJECTIVE   No new complaints  VITAL SIGNS:  Blood pressure (!) 127/40, pulse 94, temperature (!) 97.5 F (36.4 C), temperature source Oral, resp. rate 19, height 5\' 5"  (1.651 m), weight 58.1 kg (128 lb), SpO2 93 %.  I/O:    Intake/Output Summary (Last 24 hours) at 01/20/2018 0814 Last data filed at 01/20/2018 0400 Gross per 24 hour  Intake 2235 ml  Output -  Net 2235 ml    PHYSICAL EXAMINATION:  GENERAL:  84 y.o.-year-old patient lying in the bed with no acute distress.  EYES: Pupils equal, round, reactive to light and accommodation. No scleral icterus. Extraocular muscles intact.  HEENT: Head atraumatic, normocephalic. Oropharynx and nasopharynx clear.  NECK:  Supple, no jugular venous distention. No thyroid enlargement, no tenderness.  LUNGS: Normal breath sounds bilaterally, no wheezing, rales,rhonchi or crepitation. No use of accessory muscles of respiration.  CARDIOVASCULAR: S1, S2 normal. No murmurs, rubs, or gallops.  ABDOMEN: Soft, non-tender, non-distended. Bowel sounds present. No organomegaly or mass.  EXTREMITIES: No pedal edema, cyanosis, or clubbing.  NEUROLOGIC: Cranial nerves II through XII are intact. Muscle strength 5/5 in all extremities. Sensation intact. Gait not  checked.  PSYCHIATRIC: The patient is alert and oriented x 3.  SKIN: No obvious rash, lesion, or ulcer.   DATA REVIEW:   CBC  Recent Labs  Lab 01/18/18 0312  WBC 8.8  HGB 10.6*  HCT 31.8*  PLT 120*    Chemistries  Recent Labs  Lab 01/20/18 0325  NA 144  K 3.7  CL 108  CO2 28  GLUCOSE 107*  BUN 15  CREATININE 0.46  CALCIUM 8.3*  AST 44*  ALT 57*  ALKPHOS 75  BILITOT 0.7    Microbiology Results   Recent Results (from the past 240 hour(s))  MRSA PCR Screening     Status: None   Collection Time: 01/18/18  2:22 AM  Result Value Ref Range Status   MRSA by PCR NEGATIVE NEGATIVE Final    Comment:        The GeneXpert MRSA Assay (FDA approved for NASAL specimens only), is one component of a comprehensive MRSA colonization surveillance program. It is not intended to diagnose MRSA infection nor to guide or monitor treatment for MRSA infections. Performed at [facility], [address] [place], [place] [address]     RADIOLOGY:  Dg Hip Operative Unilat W Or W/o Pelvis Right  Result Date: 01/18/2018  CLINICAL DATA:  Right hip fracture EXAM: OPERATIVE right  HIP (WITH PELVIS IF PERFORMED) 8 VIEWS TECHNIQUE: Fluoroscopic spot image(s) were submitted for interpretation post-operatively. COMPARISON:  Radiograph 01/17/2018 FINDINGS: Eight low resolution intraoperative spot views of the right hip. Total fluoroscopy time was 30 seconds. Images were obtained during internal fixation of right intertrochanteric fracture. IMPRESSION: Intraoperative fluoroscopic assistance provided during surgical fixation of proximal right femur fracture Electronically Signed   By: Donavan Foil M.D.   On: 01/18/2018 20:35     Management plans discussed with the patient, family and they are in agreement.  CODE STATUS:     Code Status Orders  (From admission, onward)        Start     Ordered   01/18/18 0211  Do not attempt resuscitation (DNR)  Continuous    Question Answer  Comment  In the event of cardiac or respiratory ARREST Do not call a "code blue"   In the event of cardiac or respiratory ARREST Do not perform Intubation, CPR, defibrillation or ACLS   In the event of cardiac or respiratory ARREST Use medication by any route, position, wound care, and other measures to relive pain and suffering. May use oxygen, suction and manual treatment of airway obstruction as needed for comfort.      01/18/18 0210    Code Status History    Date Active Date Inactive Code Status Order ID Comments User Context   06/04/2017 19:15 06/07/2017 19:28 DNR [id number]  Fritzi Mandes, MD Inpatient    Advance Directive Documentation     Most Recent Value  Type of Advance Directive  Out of facility DNR (pink MOST or yellow form)  Pre-existing out of facility DNR order (yellow form or pink MOST form)  No data  "MOST" Form in Place?  No data      TOTAL TIME TAKING CARE OF THIS PATIENT: 40 minutes.    Fritzi Mandes M.D on 01/20/2018 at 8:14 AM  Between 7am to 6pm - Pager - [phone] After 6pm go to [email or web address] - password [email or web address] [facility]  Office  [phone]  CC: Primary care physician; Patient, No Pcp Per

## 2018-01-20 NOTE — Progress Notes (Signed)
PT Cancellation Note  Patient Details Name: EDDITH MENTOR MRN: [id number] DOB: Nov 06, 1934   Cancelled Treatment:    Reason Eval/Treat Not Completed: Medical issues which prohibited therapy; Pt's HgB 6.5 and trending down, will hold PM therapy session this date and will attempt to see pt tomorrow as medically appropriate.     Linus Salmons PT, DPT 01/20/18, 2:27 PM

## 2018-01-21 DIAGNOSIS — D62 Acute posthemorrhagic anemia: Secondary | ICD-10-CM | POA: Diagnosis not present

## 2018-01-21 DIAGNOSIS — M84459A Pathological fracture, hip, unspecified, initial encounter for fracture: Secondary | ICD-10-CM | POA: Diagnosis not present

## 2018-01-21 DIAGNOSIS — S72001D Fracture of unspecified part of neck of right femur, subsequent encounter for closed fracture with routine healing: Secondary | ICD-10-CM | POA: Diagnosis not present

## 2018-01-21 DIAGNOSIS — X58XXXD Exposure to other specified factors, subsequent encounter: Secondary | ICD-10-CM | POA: Diagnosis not present

## 2018-01-21 DIAGNOSIS — S7001XD Contusion of right hip, subsequent encounter: Secondary | ICD-10-CM | POA: Diagnosis not present

## 2018-01-21 DIAGNOSIS — M8000XD Age-related osteoporosis with current pathological fracture, unspecified site, subsequent encounter for fracture with routine healing: Secondary | ICD-10-CM | POA: Diagnosis not present

## 2018-01-21 DIAGNOSIS — E538 Deficiency of other specified B group vitamins: Secondary | ICD-10-CM | POA: Diagnosis not present

## 2018-01-21 DIAGNOSIS — F0390 Unspecified dementia without behavioral disturbance: Secondary | ICD-10-CM | POA: Diagnosis not present

## 2018-01-21 DIAGNOSIS — S72001A Fracture of unspecified part of neck of right femur, initial encounter for closed fracture: Secondary | ICD-10-CM | POA: Diagnosis not present

## 2018-01-21 DIAGNOSIS — G301 Alzheimer's disease with late onset: Secondary | ICD-10-CM | POA: Diagnosis not present

## 2018-01-21 DIAGNOSIS — D696 Thrombocytopenia, unspecified: Secondary | ICD-10-CM | POA: Diagnosis not present

## 2018-01-21 DIAGNOSIS — M6281 Muscle weakness (generalized): Secondary | ICD-10-CM | POA: Diagnosis not present

## 2018-01-21 DIAGNOSIS — M25551 Pain in right hip: Secondary | ICD-10-CM | POA: Diagnosis not present

## 2018-01-21 DIAGNOSIS — R5381 Other malaise: Secondary | ICD-10-CM | POA: Diagnosis not present

## 2018-01-21 DIAGNOSIS — R269 Unspecified abnormalities of gait and mobility: Secondary | ICD-10-CM | POA: Diagnosis not present

## 2018-01-21 DIAGNOSIS — S72141D Displaced intertrochanteric fracture of right femur, subsequent encounter for closed fracture with routine healing: Secondary | ICD-10-CM | POA: Diagnosis not present

## 2018-01-21 DIAGNOSIS — Z7401 Bed confinement status: Secondary | ICD-10-CM | POA: Diagnosis not present

## 2018-01-21 DIAGNOSIS — F0281 Dementia in other diseases classified elsewhere with behavioral disturbance: Secondary | ICD-10-CM | POA: Diagnosis not present

## 2018-01-21 DIAGNOSIS — R262 Difficulty in walking, not elsewhere classified: Secondary | ICD-10-CM | POA: Diagnosis not present

## 2018-01-21 DIAGNOSIS — T17308A Unspecified foreign body in larynx causing other injury, initial encounter: Secondary | ICD-10-CM | POA: Diagnosis not present

## 2018-01-21 DIAGNOSIS — D473 Essential (hemorrhagic) thrombocythemia: Secondary | ICD-10-CM | POA: Diagnosis not present

## 2018-01-21 DIAGNOSIS — F331 Major depressive disorder, recurrent, moderate: Secondary | ICD-10-CM | POA: Diagnosis not present

## 2018-01-21 DIAGNOSIS — R42 Dizziness and giddiness: Secondary | ICD-10-CM | POA: Diagnosis not present

## 2018-01-21 DIAGNOSIS — Z9181 History of falling: Secondary | ICD-10-CM | POA: Diagnosis not present

## 2018-01-21 DIAGNOSIS — Z9889 Other specified postprocedural states: Secondary | ICD-10-CM | POA: Diagnosis not present

## 2018-01-21 LAB — BPAM RBC
Blood Product Expiration Date: 201902242359
ISSUE DATE / TIME: 201902081637
Unit Type and Rh: 600

## 2018-01-21 LAB — COMPREHENSIVE METABOLIC PANEL
ALT: 35 U/L (ref 14–54)
AST: 25 U/L (ref 15–41)
Albumin: 2.4 g/dL — ABNORMAL LOW (ref 3.5–5.0)
Alkaline Phosphatase: 69 U/L (ref 38–126)
Anion gap: 9 (ref 5–15)
BUN: 18 mg/dL (ref 6–20)
CHLORIDE: 107 mmol/L (ref 101–111)
CO2: 28 mmol/L (ref 22–32)
CREATININE: 0.46 mg/dL (ref 0.44–1.00)
Calcium: 8.1 mg/dL — ABNORMAL LOW (ref 8.9–10.3)
GFR calc Af Amer: >60 mL/min (ref >60)
GFR calc non Af Amer: >60 mL/min (ref >60)
Glucose, Bld: 105 mg/dL — ABNORMAL HIGH (ref 65–99)
Potassium: 3.5 mmol/L (ref 3.5–5.1)
SODIUM: 144 mmol/L (ref 135–145)
Total Bilirubin: 0.9 mg/dL (ref 0.3–1.2)
Total Protein: 4.7 g/dL — ABNORMAL LOW (ref 6.5–8.1)

## 2018-01-21 LAB — TYPE AND SCREEN
ABO/RH(D): A NEG
Antibody Screen: NEGATIVE
Unit division: 0

## 2018-01-21 LAB — GLUCOSE, CAPILLARY: Glucose-Capillary: 89 mg/dL (ref 65–99)

## 2018-01-21 NOTE — Clinical Social Work Note (Signed)
The patient will discharge today to [facility] via non-emergent EMS. [name] and the facility are aware and in agreement. The CSW has delivered the updated discharge packet, and the facility has received all needed documentation. The CSW is signing off. Please consult should additional needs arise.  Santiago Bumpers, MSW, [place] Presser [phone]

## 2018-01-21 NOTE — Discharge Summary (Signed)
[place] [facility] [place] NAME: Ronnette Rump    MR#:  [id number]  DATE OF BIRTH:  19-Jul-1934  DATE OF ADMISSION:  01/17/2018 ADMITTING PHYSICIAN: Amelia Jo, MD  DATE OF DISCHARGE:   PRIMARY CARE PHYSICIAN: Patient, No Pcp Per    ADMISSION DIAGNOSIS:  Fall, initial encounter [W19.XXXA] Closed fracture of right hip, initial encounter ([place]) [S72.001A]  DISCHARGE DIAGNOSIS:  Close fracture of the right hip status post intramedullary nail intertrochanteric Dr. Harlow Mares Post operative acute blood loss anemia SECONDARY DIAGNOSIS:   Past Medical History:  Diagnosis Date  . Alzheimer's dementia, late onset   . Depression   . Dizziness   . Heart murmur   . Hx of migraine headaches   . Hypertension   . RBBB   . Thrombocytopenia ([place])   . Vitamin D deficiency disease     HOSPITAL COURSE:   [name] a84 y.o.femalewith a known history of Alzheimer's dementia, hypertension and thrombocytopenia. Patient was brought to emergency room status post mechanical fall at her assisted living facility,that resulted in right hip fracture.Patient does not recall the details of her fall,but apparently she was walking without a walker on on the hallway and lost her balance.  1.Acute right femoral intertrochanteric fracture,status post mechanical fall. -Orthopedic consultation with Dr Harlow Mares appreciated -POD #2 INTRAMEDULLARY (IM) NAIL INTERTROCHANTRIC (TFNA), RIGHT -Continue pain control. -pt has no cardiac history other than EKG showing RBBB  2.Alzheimer's dementia,stable -cont home medications.  3.Thrombocytopenia,stable,platelet count is currently 120.  4. Post op anenia due to surgery -10.6--6.8--1 unit BT --8.4 -no fever  5.DVT prophlylaxis On lovenox for 14 days  Overall stable. D/c to [facility] today CONSULTS OBTAINED:  Treatment Team:  Lovell Sheehan, MD  DRUG ALLERGIES:  No Known  Allergies  DISCHARGE MEDICATIONS:   Allergies as of 01/21/2018   No Known Allergies     Medication List    STOP taking these medications   LORazepam 0.5 MG tablet Commonly known as:  ATIVAN     TAKE these medications   acetaminophen 325 MG tablet Commonly known as:  TYLENOL Take 325 mg by mouth every 6 (six) hours as needed.   buPROPion 300 MG 24 hr tablet Commonly known as:  WELLBUTRIN XL Take 1 tablet (300 mg total) by mouth daily.   divalproex 250 MG DR tablet Commonly known as:  DEPAKOTE Take 1 tablet (250 mg total) by mouth 2 (two) times daily.   donepezil 10 MG tablet Commonly known as:  ARICEPT Take 1 tablet (10 mg total) by mouth at bedtime.   enoxaparin 30 MG/0.3ML injection Commonly known as:  LOVENOX Inject 0.3 mLs (30 mg total) into the skin daily.   mirtazapine 7.5 MG tablet Commonly known as:  REMERON Take 7.5 mg by mouth at bedtime.   vitamin B-12 500 MCG tablet Commonly known as:  CYANOCOBALAMIN Take 500 mcg by mouth daily.       If you experience worsening of your admission symptoms, develop shortness of breath, life threatening emergency, suicidal or homicidal thoughts you must seek medical attention immediately by calling 911 or calling your MD immediately  if symptoms less severe.  You Must read complete instructions/literature along with all the possible adverse reactions/side effects for all the Medicines you take and that have been prescribed to you. Take any new Medicines after you have completely understood and accept all the possible adverse reactions/side effects.   Please note  You were cared for by a hospitalist during  your hospital stay. If you have any questions about your discharge medications or the care you received while you were in the hospital after you are discharged, you can call the unit and asked to speak with the hospitalist on call if the hospitalist that took care of you is not available. Once you are discharged, your  primary care physician will handle any further medical issues. Please note that NO REFILLS for any discharge medications will be authorized once you are discharged, as it is imperative that you return to your primary care physician (or establish a relationship with a primary care physician if you do not have one) for your aftercare needs so that they can reassess your need for medications and monitor your lab values. Today   SUBJECTIVE   No new complaints  VITAL SIGNS:  Blood pressure (!) 127/40, pulse 81, temperature 98.5 F (36.9 C), temperature source Axillary, resp. rate 19, height 5\' 5"  (1.651 m), weight 54.4 kg (120 lb), SpO2 97 %.  I/O:    Intake/Output Summary (Last 24 hours) at 01/21/2018 0725 Last data filed at 01/20/2018 1930 Gross per 24 hour  Intake 2550 ml  Output -  Net 2550 ml    PHYSICAL EXAMINATION:  GENERAL:  84 y.o.-year-old patient lying in the bed with no acute distress.  EYES: Pupils equal, round, reactive to light and accommodation. No scleral icterus. Extraocular muscles intact.  HEENT: Head atraumatic, normocephalic. Oropharynx and nasopharynx clear.  NECK:  Supple, no jugular venous distention. No thyroid enlargement, no tenderness.  LUNGS: Normal breath sounds bilaterally, no wheezing, rales,rhonchi or crepitation. No use of accessory muscles of respiration.  CARDIOVASCULAR: S1, S2 normal. No murmurs, rubs, or gallops.  ABDOMEN: Soft, non-tender, non-distended. Bowel sounds present. No organomegaly or mass.  EXTREMITIES: No pedal edema, cyanosis, or clubbing.  NEUROLOGIC: Cranial nerves II through XII are intact. Muscle strength 5/5 in all extremities. Sensation intact. Gait not checked.  PSYCHIATRIC: The patient is alert and oriented x 3.  SKIN: No obvious rash, lesion, or ulcer.   DATA REVIEW:   CBC  Recent Labs  Lab 01/18/18 0312  01/20/18 2204  WBC 8.8  --   --   HGB 10.6*   < > 8.4*  HCT 31.8*  --   --   PLT 120*  --   --    < > = values in  this interval not displayed.    Chemistries  Recent Labs  Lab 01/21/18 0410  NA 144  K 3.5  CL 107  CO2 28  GLUCOSE 105*  BUN 18  CREATININE 0.46  CALCIUM 8.1*  AST 25  ALT 35  ALKPHOS 66  BILITOT 0.9    Microbiology Results   Recent Results (from the past 240 hour(s))  MRSA PCR Screening     Status: None   Collection Time: 01/18/18  2:22 AM  Result Value Ref Range Status   MRSA by PCR NEGATIVE NEGATIVE Final    Comment:        The GeneXpert MRSA Assay (FDA approved for NASAL specimens only), is one component of a comprehensive MRSA colonization surveillance program. It is not intended to diagnose MRSA infection nor to guide or monitor treatment for MRSA infections. Performed at [facility], [address], [place], [place] [address]     RADIOLOGY:  No results found.   Management plans discussed with the patient, family and they are in agreement.  CODE STATUS:     Code Status Orders  (From  admission, onward)        Start     Ordered   01/18/18 0211  Do not attempt resuscitation (DNR)  Continuous    Question Answer Comment  In the event of cardiac or respiratory ARREST Do not call a "code blue"   In the event of cardiac or respiratory ARREST Do not perform Intubation, CPR, defibrillation or ACLS   In the event of cardiac or respiratory ARREST Use medication by any route, position, wound care, and other measures to relive pain and suffering. May use oxygen, suction and manual treatment of airway obstruction as needed for comfort.      01/18/18 0210    Code Status History    Date Active Date Inactive Code Status Order ID Comments User Context   06/04/2017 19:15 06/07/2017 19:28 DNR [id number]  Fritzi Mandes, MD Inpatient    Advance Directive Documentation     Most Recent Value  Type of Advance Directive  Out of facility DNR (pink MOST or yellow form)  Pre-existing out of facility DNR order (yellow form or pink MOST form)  No data  "MOST"  Form in Place?  No data      TOTAL TIME TAKING CARE OF THIS PATIENT: 40 minutes.    Fritzi Mandes M.D on 01/21/2018 at 7:25 AM  Between 7am to 6pm - Pager - [phone] After 6pm go to [email or web address] - password [email or web address] [facility]  Office  [phone]  CC: Primary care physician; Patient, No Pcp Per

## 2018-01-21 NOTE — Progress Notes (Signed)
Physical Therapy Treatment Patient Details Name: [name] MRN: [id number] DOB: [birth year] Today's Date: 01/21/2018    History of Present Illness Pt is an83 y.o.femalewith a known history of Alzheimer's dementia, hypertension and thrombocytopenia.  Patient was brought to emergency room status post mechanical fall at her assisted living facility,that resulted in right hip fracture. Patient does not recall the details of her fall,but apparently she was walking without a walker in the hallway and lost her balance.Patient denied having chest pain,palpitations or dizziness before the fall. Pelvis x-ray done in the emergency room showedcomminuted and mildly displaced right femoral intertrochanteric fracture, with a displaced lesser trochanteric fragment.CT of the brain and cervical spine was negative for any acute findings.  Pt is now s/p [name].      PT Comments    Participated in exercises as described below.  Pt incontinent of urine  Rolling left and right for care with mod/max a.  To edge of bed with mod assist and heavy verbal and tactile cues.  Able to remain sitting with min guard.  Unsafe to be left unattended.  Stood with walker x 2 but unable to get fully upright with min/mod a x 2.  Trunk remained flexed.  Stand pivot to recliner with mod/max a x 1.  She was able to move her feet some to assist with transfer.  Once sitting, incontinent of loose, runny BM.  Stood with walker with +2 assist for care.  She remained with poor standing posture despite verbal and physical cues to correct.  Repositioned in recliner.   Follow Up Recommendations  SNF     Equipment Recommendations  None recommended by PT    Recommendations for Other Services       Precautions / Restrictions Precautions Precautions: Fall Restrictions Weight Bearing Restrictions: Yes RLE Weight Bearing: Weight bearing as tolerated    Mobility  Bed Mobility Overal bed mobility: Needs Assistance Bed  Mobility: Rolling;Supine to Sit Rolling: Mod assist   Supine to sit: Mod assist        Transfers Overall transfer level: Needs assistance Equipment used: Rolling walker (2 wheeled) Transfers: Sit to/from [facility] Sit to Stand: Mod assist Stand pivot transfers: Mod assist       General transfer comment: stood with walker x 3 - unable to get fully upright  Ambulation/Gait     Assistive device: Rolling walker (2 wheeled);None Gait Pattern/deviations: Step-to pattern;Decreased step length - right;Decreased step length - left   Gait velocity interpretation: <1.8 ft/sec, indicative of risk for recurrent falls General Gait Details: able to take several small steps during stand pivot to chair   Stairs            Wheelchair Mobility    Modified Rankin (Stroke Patients Only)       Balance Overall balance assessment: Needs assistance Sitting-balance support: Feet supported;Bilateral upper extremity supported Sitting balance-Leahy Scale: Fair     Standing balance support: Bilateral upper extremity supported Standing balance-Leahy Scale: Poor                              Cognition   Behavior During Therapy: WFL for tasks assessed/performed Overall Cognitive Status: History of cognitive impairments - at baseline  Exercises Other Exercises Other Exercises: supine AAROM BLE x 10 for ankle pumps, heel slides, SLR, ab/add Other Exercises: rolling left and right in bed for incontnence care Other Exercises: stood with walker for 2 minutes for care after BM - +2 asssit and poor standing with flexed trunk    General Comments        Pertinent Vitals/Pain Pain Assessment: Faces Faces Pain Scale: Hurts a little bit Pain Descriptors / Indicators: Grimacing;Moaning Pain Intervention(s): Limited activity within patient's tolerance;Monitored during session    Home Living                       Prior Function            PT Goals (current goals can now be found in the care plan section) Progress towards PT goals: Progressing toward goals    Frequency    BID      PT Plan Current plan remains appropriate    Co-evaluation              AM-PAC PT "6 Clicks" Daily Activity  Outcome Measure  Difficulty turning over in bed (including adjusting bedclothes, sheets and blankets)?: Unable Difficulty moving from lying on back to sitting on the side of the bed? : Unable Difficulty sitting down on and standing up from a chair with arms (e.g., wheelchair, bedside commode, etc,.)?: Unable Help needed moving to and from a bed to chair (including a wheelchair)?: Total Help needed walking in hospital room?: Total Help needed climbing 3-5 steps with a railing? : Total 6 Click Score: 6    End of Session Equipment Utilized During Treatment: Gait belt Activity Tolerance: Patient tolerated treatment well;Patient limited by pain Patient left: in chair;with chair alarm set;with call bell/phone within reach;with family/visitor present         Time: 1005-1039 PT Time Calculation (min) (ACUTE ONLY): 34 min  Charges:  $Therapeutic Exercise: 8-22 mins $Therapeutic Activity: 8-22 mins                    G Codes:       Chesley Noon, PTA 01/21/18, 11:18 AM

## 2018-01-21 NOTE — Progress Notes (Signed)
Report called to [place] at [facility]

## 2018-01-21 NOTE — Progress Notes (Signed)
Subjective:  POD #3 s/p IM fixation for right hip fracture.  Patient reports right hip pain as minimal.   Family member at the bedside.  Patient has no complaints.  Hgb improved after PRBC yesterday.  Objective:   VITALS:   Vitals:   01/20/18 1940 01/21/18 0355 01/21/18 0412 01/21/18 0900  BP: (!) 139/52 (!) 127/40  (!) 121/27  Pulse: 96 81  84  Resp: 18 19  18   Temp: 98.7 F (37.1 C) 98.5 F (36.9 C)  98.3 F (36.8 C)  TempSrc: Oral Axillary  Oral  SpO2: 96% 97%  95%  Weight:   54.4 kg (120 lb)   Height:        PHYSICAL EXAM: Patient is awake and participates with exam today. Right lower extremity:  Hip spica dressing is in place and clean today.  No evidence of drainage.  Neurovascular intact Sensation intact distally Intact pulses distally Dorsiflexion/Plantar flexion intact Incision: dressing C/D/I No cellulitis present Compartment soft  LABS  Results for orders placed or performed during the hospital encounter of 01/17/18 (from the past 24 hour(s))  Prepare RBC     Status: None   Collection Time: 01/20/18 10:00 AM  Result Value Ref Range   Order Confirmation      ORDER PROCESSED BY BLOOD BANK Performed at [facility], [address] [place], [place] [address]   Hemoglobin     Status: Abnormal   Collection Time: 01/20/18 10:00 AM  Result Value Ref Range   Hemoglobin 6.5 (L) 12.0 - 16.0 g/dL  Type and screen [place]     Status: None   Collection Time: 01/20/18 10:02 AM  Result Value Ref Range   ABO/RH(D) A NEG    Antibody Screen NEG    Sample Expiration      01/23/2018 Performed at [facility], [address] [place], [place] [address]   Hemoglobin     Status: Abnormal   Collection Time: 01/20/18 10:04 PM  Result Value Ref Range   Hemoglobin 8.4 (L) 12.0 - 16.0 g/dL  Comprehensive metabolic panel     Status: Abnormal   Collection Time: 01/21/18  4:10 AM  Result Value Ref Range   Sodium 144 135 - 145  mmol/L   Potassium 3.5 3.5 - 5.1 mmol/L   Chloride 107 101 - 111 mmol/L   CO2 28 22 - 32 mmol/L   Glucose, Bld 105 (H) 65 - 99 mg/dL   BUN 18 6 - 20 mg/dL   Creatinine, Ser 0.46 0.44 - 1.00 mg/dL   Calcium 8.1 (L) 8.9 - 10.3 mg/dL   Total Protein 4.7 (L) 6.5 - 8.1 g/dL   Albumin 2.4 (L) 3.5 - 5.0 g/dL   AST 25 15 - 41 U/L   ALT 35 14 - 54 U/L   Alkaline Phosphatase 69 38 - 126 U/L   Total Bilirubin 0.9 0.3 - 1.2 mg/dL   GFR calc non Af Amer >60 >60 mL/min   GFR calc Af Amer >60 >60 mL/min   Anion gap 9 5 - 15  Glucose, capillary     Status: None   Collection Time: 01/21/18  7:27 AM  Result Value Ref Range   Glucose-Capillary 89 65 - 99 mg/dL   Comment 1 Notify RN     No results found.  Assessment/Plan: 3 Days Post-Op   Active Problems:   Closed right hip fracture [facility])  Patient doing well from an orthopaedic standpoint.  Hgb 8.4 after PRBC.  Vitals stable.  Patient to be discharged to [facility] today and will follow up with Dr. Kurtis Bushman in clinic in 1-2 weeks.  Continue lovenox 30mg  SQ daily until follow up for DVT prophylaxis.   Patient is WBAT on the right lower extremity.   Continue PT at [facility].   May discontinue hip spica and reinforce dressing as needed.      Thornton Park , MD 01/21/2018, 9:24 AM

## 2018-01-21 NOTE — Progress Notes (Signed)
Pt alert but confused and agitated at times. Tolerated blood transfusion without difficulty. Pressure dressing with small amount of drainage to it. Pt with no complaint of pain this shift. Pt abel to sleep in between care.

## 2018-01-23 DIAGNOSIS — G301 Alzheimer's disease with late onset: Secondary | ICD-10-CM

## 2018-01-23 DIAGNOSIS — D473 Essential (hemorrhagic) thrombocythemia: Secondary | ICD-10-CM | POA: Diagnosis not present

## 2018-01-23 DIAGNOSIS — F0281 Dementia in other diseases classified elsewhere with behavioral disturbance: Secondary | ICD-10-CM

## 2018-01-23 DIAGNOSIS — F02818 Dementia in other diseases classified elsewhere, unspecified severity, with other behavioral disturbance: Secondary | ICD-10-CM

## 2018-01-23 DIAGNOSIS — M8000XD Age-related osteoporosis with current pathological fracture, unspecified site, subsequent encounter for fracture with routine healing: Secondary | ICD-10-CM | POA: Diagnosis not present

## 2018-01-23 HISTORY — DX: Dementia in other diseases classified elsewhere, unspecified severity, with other behavioral disturbance: F02.818

## 2018-01-23 HISTORY — DX: Alzheimer's disease with late onset: G30.1

## 2018-01-23 HISTORY — DX: Age-related osteoporosis with current pathological fracture, unspecified site, subsequent encounter for fracture with routine healing: M80.00XD

## 2018-01-23 HISTORY — DX: Dementia in other diseases classified elsewhere with behavioral disturbance: F02.81

## 2018-01-24 ENCOUNTER — Other Ambulatory Visit
Admission: RE | Admit: 2018-01-24 | Discharge: 2018-01-24 | Disposition: A | Discharge status: Home / Self Care | Payer: PPO | Source: Ambulatory Visit | Attending: Gerontology | Admitting: Gerontology

## 2018-01-24 DIAGNOSIS — G301 Alzheimer's disease with late onset: Secondary | ICD-10-CM | POA: Insufficient documentation

## 2018-01-24 LAB — CBC WITH DIFFERENTIAL/PLATELET
BASOS ABS: 0 10*3/uL (ref 0–0.1)
BASOS PCT: 1 %
Basophils Absolute: 0 10*3/uL (ref 0–0.1)
Basophils Relative: 0 %
EOS ABS: 0.1 10*3/uL (ref 0–0.7)
EOS PCT: 2 %
Eosinophils Absolute: 0.1 10*3/uL (ref 0–0.7)
Eosinophils Relative: 1 %
HCT: 21.5 % — ABNORMAL LOW (ref 35.0–47.0)
HEMATOCRIT: 24.7 % — AB (ref 35.0–47.0)
Hemoglobin: 7.1 g/dL — ABNORMAL LOW (ref 12.0–16.0)
Hemoglobin: 8.2 g/dL — ABNORMAL LOW (ref 12.0–16.0)
Lymphocytes Relative: 21 %
Lymphocytes Relative: 16 %
Lymphs Abs: 1.2 10*3/uL (ref 1.0–3.6)
Lymphs Abs: 1.3 10*3/uL (ref 1.0–3.6)
MCH: 31.1 pg (ref 26.0–34.0)
MCH: 31.1 pg (ref 26.0–34.0)
MCHC: 33.1 g/dL (ref 32.0–36.0)
MCHC: 33.1 g/dL (ref 32.0–36.0)
MCV: 94.2 fL (ref 80.0–100.0)
MCV: 93.9 fL (ref 80.0–100.0)
MONO ABS: 0.7 10*3/uL (ref 0.2–0.9)
MONO ABS: 0.8 10*3/uL (ref 0.2–0.9)
MONOS PCT: 12 %
Monocytes Relative: 10 %
Neutro Abs: 3.9 10*3/uL (ref 1.4–6.5)
Neutro Abs: 5.7 10*3/uL (ref 1.4–6.5)
Neutrophils Relative %: 66 %
Neutrophils Relative %: 73 %
PLATELETS: 148 10*3/uL — AB (ref 150–440)
Platelets: 204 10*3/uL (ref 150–440)
RBC: 2.29 MIL/uL — ABNORMAL LOW (ref 3.80–5.20)
RBC: 2.63 MIL/uL — AB (ref 3.80–5.20)
RDW: 14.2 % (ref 11.5–14.5)
RDW: 14.3 % (ref 11.5–14.5)
WBC: 5.9 10*3/uL (ref 3.6–11.0)
WBC: 7.9 10*3/uL (ref 3.6–11.0)

## 2018-01-24 LAB — COMPREHENSIVE METABOLIC PANEL
ALBUMIN: 2.3 g/dL — AB (ref 3.5–5.0)
ALT: 19 U/L (ref 14–54)
ANION GAP: 6 (ref 5–15)
AST: 23 U/L (ref 15–41)
Alkaline Phosphatase: 82 U/L (ref 38–126)
BILIRUBIN TOTAL: 1 mg/dL (ref 0.3–1.2)
BUN: 20 mg/dL (ref 6–20)
CHLORIDE: 106 mmol/L (ref 101–111)
CO2: 31 mmol/L (ref 22–32)
Calcium: 8.3 mg/dL — ABNORMAL LOW (ref 8.9–10.3)
Creatinine, Ser: 0.36 mg/dL — ABNORMAL LOW (ref 0.44–1.00)
GFR calc Af Amer: >60 mL/min (ref >60)
GFR calc non Af Amer: >60 mL/min (ref >60)
GLUCOSE: 96 mg/dL (ref 65–99)
Potassium: 3.3 mmol/L — ABNORMAL LOW (ref 3.5–5.1)
SODIUM: 143 mmol/L (ref 135–145)
TOTAL PROTEIN: 5 g/dL — AB (ref 6.5–8.1)

## 2018-01-25 ENCOUNTER — Other Ambulatory Visit: Payer: Self-pay

## 2018-01-25 MED ORDER — TRAMADOL HCL 50 MG PO TABS: 50.0000 mg | ORAL_TABLET | ORAL | 1 refills | Status: DC | PRN

## 2018-01-25 MED ORDER — TRAMADOL HCL 50 MG PO TABS: 50.0000 mg | ORAL_TABLET | Freq: Three times a day (TID) | ORAL | 1 refills | Status: DC

## 2018-01-25 NOTE — Telephone Encounter (Signed)
Rx sent to [facility] phone : [phone] , fax : [phone]  

## 2018-01-26 ENCOUNTER — Other Ambulatory Visit
Admission: RE | Admit: 2018-01-26 | Discharge: 2018-01-26 | Disposition: A | Discharge status: Home / Self Care | Payer: PPO | Source: Ambulatory Visit | Attending: Gerontology | Admitting: Gerontology

## 2018-01-26 DIAGNOSIS — S72141D Displaced intertrochanteric fracture of right femur, subsequent encounter for closed fracture with routine healing: Secondary | ICD-10-CM | POA: Insufficient documentation

## 2018-01-26 DIAGNOSIS — S7001XD Contusion of right hip, subsequent encounter: Secondary | ICD-10-CM | POA: Diagnosis not present

## 2018-01-26 DIAGNOSIS — X58XXXD Exposure to other specified factors, subsequent encounter: Secondary | ICD-10-CM | POA: Insufficient documentation

## 2018-01-26 LAB — CBC WITH DIFFERENTIAL/PLATELET
Basophils Absolute: 0 10*3/uL (ref 0–0.1)
Basophils Relative: 1 %
EOS ABS: 0.1 10*3/uL (ref 0–0.7)
Eosinophils Relative: 2 %
HEMATOCRIT: 24.3 % — AB (ref 35.0–47.0)
HEMOGLOBIN: 8 g/dL — AB (ref 12.0–16.0)
LYMPHS ABS: 1.1 10*3/uL (ref 1.0–3.6)
Lymphocytes Relative: 18 %
MCH: 31.3 pg (ref 26.0–34.0)
MCHC: 33.1 g/dL (ref 32.0–36.0)
MCV: 94.5 fL (ref 80.0–100.0)
Monocytes Absolute: 0.6 10*3/uL (ref 0.2–0.9)
Monocytes Relative: 9 %
NEUTROS ABS: 4.3 10*3/uL (ref 1.4–6.5)
NEUTROS PCT: 70 %
Platelets: 256 10*3/uL (ref 150–440)
RBC: 2.57 MIL/uL — AB (ref 3.80–5.20)
RDW: 14.5 % (ref 11.5–14.5)
WBC: 6.1 10*3/uL (ref 3.6–11.0)

## 2018-01-26 LAB — COMPREHENSIVE METABOLIC PANEL
ALK PHOS: 118 U/L (ref 38–126)
ALT: 19 U/L (ref 14–54)
AST: 29 U/L (ref 15–41)
Albumin: 2.6 g/dL — ABNORMAL LOW (ref 3.5–5.0)
Anion gap: 11 (ref 5–15)
BUN: 23 mg/dL — ABNORMAL HIGH (ref 6–20)
CALCIUM: 8.8 mg/dL — AB (ref 8.9–10.3)
CO2: 27 mmol/L (ref 22–32)
CREATININE: 0.36 mg/dL — AB (ref 0.44–1.00)
Chloride: 108 mmol/L (ref 101–111)
GFR calc non Af Amer: >60 mL/min (ref >60)
GLUCOSE: 108 mg/dL — AB (ref 65–99)
Potassium: 3.7 mmol/L (ref 3.5–5.1)
SODIUM: 146 mmol/L — AB (ref 135–145)
Total Bilirubin: 1.1 mg/dL (ref 0.3–1.2)
Total Protein: 5.5 g/dL — ABNORMAL LOW (ref 6.5–8.1)

## 2018-02-03 ENCOUNTER — Encounter: Payer: Self-pay | Admitting: Gerontology

## 2018-02-03 ENCOUNTER — Non-Acute Institutional Stay (SKILLED_NURSING_FACILITY): Payer: PPO | Admitting: Gerontology

## 2018-02-03 DIAGNOSIS — Z9889 Other specified postprocedural states: Secondary | ICD-10-CM | POA: Diagnosis not present

## 2018-02-03 DIAGNOSIS — S72001D Fracture of unspecified part of neck of right femur, subsequent encounter for closed fracture with routine healing: Secondary | ICD-10-CM

## 2018-02-03 DIAGNOSIS — F0281 Dementia in other diseases classified elsewhere with behavioral disturbance: Secondary | ICD-10-CM | POA: Diagnosis not present

## 2018-02-03 DIAGNOSIS — G301 Alzheimer's disease with late onset: Secondary | ICD-10-CM | POA: Diagnosis not present

## 2018-02-03 DIAGNOSIS — F02818 Dementia in other diseases classified elsewhere, unspecified severity, with other behavioral disturbance: Secondary | ICD-10-CM

## 2018-02-03 NOTE — Progress Notes (Signed)
Location:   [facility] Room Number: 207B Place of Service:  SNF [phone]) Provider:  Toni Arthurs, NP-C  Patient, No Pcp Per  Patient Care Team: Patient, No Pcp Per as PCP - General (General Practice)  Extended Emergency Contact Information Primary Emergency Contact: Davis,Carrie G Address: [address] [place], [place] [address] Johnnette Litter of [place] Phone: [phone] Work Phone: [phone] Mobile Phone: [phone] Relation: Daughter Secondary Emergency Contact: Kerry Kass Address: [address]          [place], [place] [address] Johnnette Litter of [place] Phone: [phone] Relation: Son  Code Status:  FULL Goals of care: Advanced Directive information Advanced Directives 02/03/2018  Does Patient Have a Medical Advance Directive? Yes  Type of Advance Directive [place]  Does patient want to make changes to medical advance directive? No - Patient declined  Copy of [place] in Chart? Yes  Would patient like information on creating a medical advance directive? -     Chief Complaint  Patient presents with  . Medical Management of Chronic Issues    Routine Visit    HPI:  Pt is a 84 y.o. female seen today for medical management of chronic diseases. Pt was admitted to the facility for rehab following hospitalization at [facility] for IM nailing d/t right hip fracture s/p mechanical fall at the ALF. Pt has alzheimers with some noted behavioral disturbances on admission. Pt was combative with care, trying to call the police, etc. Pt was initiated on seroquel and lorazepam with favorable results. Pt is not much calmer and cooperative, pleasant. Wandering. Rolled herself out of the building several days ago. Wanderguard placed on pt. For her safety. She has been attempting to participate with PT/OT. She is mobile on the unit in a wheelchair. Pt reports her pain is well controlled. On scheduled  and prn pain meds. No difficulty eating. Voiding well and having regular BMs. Incision well approximated. Calves soft, supple. Negative Homan's sign. VSS. No other complaints.   Please note pt with limited verbal ability. Unable to obtain complete ROS. Some ROS info obtained from staff and documentation.    Past Medical History:  Diagnosis Date  . Age-related osteoporosis with current pathological fracture with routine healing 01/23/2018  . Alzheimer's dementia, late onset   . Cataract cortical, senile   . Depression   . Diverticulosis 11/22/2008  . Dizziness   . Heart murmur   . Hemorrhoids   . History of colonic polyps 11/22/2008  . Hx of migraine headaches   . Hypertension   . Late onset Alzheimer's disease with behavioral disturbance 01/23/2018  . Osteoarthritis   . Pelvic fracture ([place])   . Psoriasis, unspecified   . RBBB   . Thrombocytopenia (HCC)    mild  . Vitamin D deficiency disease    Past Surgical History:  Procedure Laterality Date  . ABDOMINAL HYSTERECTOMY    . APPENDECTOMY    . CATARACT EXTRACTION EXTRACAPSULAR  2007   [facility]  ([place])  . CATARACT EXTRACTION, BILATERAL    . CHOLECYSTECTOMY    . COLONOSCOPY W/ POLYPECTOMY     06/2002, 11/22/2008 Nicolasa Ducking / [facility])  . INTRAMEDULLARY (IM) NAIL INTERTROCHANTERIC Right 01/18/2018   Procedure: INTRAMEDULLARY (IM) NAIL INTERTROCHANTRIC ([facility]);  Surgeon: Lovell Sheehan, MD;  Location: [facility] ORS;  Service: Orthopedics;  Laterality: Right;    No Known Allergies  Allergies as of 02/03/2018   No  Known Allergies     Medication List        Accurate as of 02/03/18 11:06 AM. Always use your most recent med list.          acetaminophen 325 MG tablet Commonly known as:  TYLENOL Take 650 mg by mouth 4 (four) times daily.   buPROPion 300 MG 24 hr tablet Commonly known as:  WELLBUTRIN XL Take 300 mg by mouth daily.   divalproex 250 MG DR tablet Commonly known as:  DEPAKOTE Take 1 tablet (250 mg total) by mouth 2 (two)  times daily.   donepezil 10 MG tablet Commonly known as:  ARICEPT Take 1 tablet (10 mg total) by mouth at bedtime.   enoxaparin 30 MG/0.3ML injection Commonly known as:  LOVENOX Inject 0.3 mLs (30 mg total) into the skin daily.   ENSURE ENLIVE PO Take 1 Bottle by mouth 2 (two) times daily between meals.   feeding supplement (PRO-STAT SUGAR FREE 64) Liqd Take 30 mLs by mouth 2 (two) times daily between meals. low protein, low albumin   LORazepam 0.5 MG tablet Commonly known as:  ATIVAN Take 0.5 mg by mouth every 4 (four) hours as needed. for restlessness/agitation   mirtazapine 7.5 MG tablet Commonly known as:  REMERON Take 7.5 mg by mouth at bedtime.   QUEtiapine 25 MG tablet Commonly known as:  SEROQUEL Take 25 mg by mouth 2 (two) times daily.   traMADol 50 MG tablet Commonly known as:  ULTRAM Take 1 tablet (50 mg total) by mouth 3 (three) times daily. Hold for sedation   traMADol 50 MG tablet Commonly known as:  ULTRAM Take 1 tablet (50 mg total) by mouth every 4 (four) hours as needed for severe pain. Hold for sedation   vitamin B-12 500 MCG tablet Commonly known as:  CYANOCOBALAMIN Take 500 mcg by mouth daily.       Review of Systems  Unable to perform ROS: Dementia  Constitutional: Negative for activity change, appetite change, chills, diaphoresis and fever.  HENT: Negative for congestion, mouth sores, nosebleeds, postnasal drip, sneezing, sore throat, trouble swallowing and voice change.   Respiratory: Negative for apnea, cough, choking, chest tightness, shortness of breath and wheezing.   Cardiovascular: Negative for chest pain, palpitations and leg swelling.  Gastrointestinal: Negative for abdominal distention, abdominal pain, constipation, diarrhea and nausea.  Genitourinary: Negative for difficulty urinating, dysuria, frequency and urgency.  Musculoskeletal: Positive for arthralgias (typical arthritis) and gait problem. Negative for back pain and myalgias.   Skin: Positive for wound. Negative for color change, pallor and rash.  Neurological: Positive for weakness. Negative for dizziness, tremors, syncope, speech difficulty, numbness and headaches.  Psychiatric/Behavioral: Positive for agitation, behavioral problems and confusion.  All other systems reviewed and are negative.   Immunization History  Administered Date(s) Administered  . Influenza, High Dose Seasonal PF 09/13/2016  . Influenza,inj,Quad PF,6+ Mos 02/05/2016  . Influenza-Unspecified 09/23/2017  . Pneumococcal Polysaccharide-23 02/05/2016  . Tdap 12/14/2011, 03/12/2017   Pertinent  Health Maintenance Due  Topic Date Due  . INFLUENZA VACCINE  Completed  . DEXA SCAN  Completed  . PNA vac Low Risk Adult  Completed   Fall Risk  12/23/2017 07/28/2017 06/22/2017 03/07/2017 09/07/2016  Falls in the past year? Yes Yes Yes No Yes  Number falls in past yr: 2 or more 2 or more 2 or more - 1  Injury with Fall? No Yes Yes - No  Risk Factor Category  High Fall Risk - High Fall Risk - -  Risk for fall due to : - - Impaired balance/gait;Impaired mobility - -  Follow up Falls evaluation completed Falls prevention discussed Falls evaluation completed;Education provided;Falls prevention discussed - Falls evaluation completed   Functional Status Survey:    Vitals:   02/03/18 1022  BP: (!) 117/46  Pulse: 92  Resp: 20  Temp: 98.9 F (37.2 C)  TempSrc: Oral  SpO2: 97%  Weight: 128 lb 1.6 oz (58.1 kg)  Height: 5' 5" (1.651 m)   Body mass index is 21.32 kg/m. Physical Exam  Constitutional: Vital signs are normal. She appears well-developed and well-nourished. She is active and cooperative. She does not appear ill. No distress.  HENT:  Head: Normocephalic and atraumatic.  Mouth/Throat: Uvula is midline, oropharynx is clear and moist and mucous membranes are normal. Mucous membranes are not pale, not dry and not cyanotic.  Eyes: Pupils are equal, round, and reactive to light. Conjunctivae,  EOM and lids are normal.  Neck: Trachea normal, normal range of motion and full passive range of motion without pain. Neck supple. No JVD present. No tracheal deviation, no edema and no erythema present. No thyromegaly present.  Cardiovascular: Normal rate, regular rhythm, normal heart sounds, intact distal pulses and normal pulses. Exam reveals no gallop, no distant heart sounds and no friction rub.  No murmur heard. Pulses:      Dorsalis pedis pulses are 2+ on the right side, and 2+ on the left side.  No edema  Pulmonary/Chest: Effort normal and breath sounds normal. No accessory muscle usage. No respiratory distress. She has no decreased breath sounds. She has no wheezes. She has no rhonchi. She has no rales. She exhibits no tenderness.  Abdominal: Soft. Normal appearance and bowel sounds are normal. She exhibits no distension and no ascites. There is no tenderness.  Musculoskeletal: She exhibits no edema.       Right hip: She exhibits decreased range of motion, decreased strength, tenderness and laceration.  Expected osteoarthritis, stiffness; Bilateral Calves soft, supple. Negative Homan's Sign. B- pedal pulses equal  Neurological: She is alert. She has normal strength. She displays atrophy. She exhibits abnormal muscle tone. Coordination and gait abnormal.  Skin: Skin is warm, dry and intact. She is not diaphoretic. No cyanosis. No pallor. Nails show no clubbing.  Psychiatric: Her speech is normal. Her affect is labile. She is slowed. Thought content is paranoid (at times). Cognition and memory are impaired. She expresses impulsivity and inappropriate judgment. She exhibits abnormal recent memory and abnormal remote memory.  Nursing note and vitals reviewed.   Labs reviewed: Recent Labs    01/21/18 0410 01/24/18 0530 01/26/18 0910  NA 144 143 146*  K 3.5 3.3* 3.7  CL 107 106 108  CO2 _0 GLUCOSE 105* 96 108*  BUN 18 20 23*  CREATININE 0.46 0.36* 0.36*  CALCIUM 8.1* 8.3*  8.8*   Recent Labs    01/21/18 0410 01/24/18 0530 01/26/18 0910  AST _1 ALT 35 19 19  ALKPHOS 69 82 118  BILITOT 0.9 1.0 1.1  PROT 4.7* 5.0* 5.5*  ALBUMIN 2.4* 2.3* 2.6*   Recent Labs    01/24/18 0530 01/24/18 1307 01/26/18 0910  WBC 5.9 7.9 6.1  NEUTROABS 3.9 5.7 4.3  HGB 7.1* 8.2* 8.0*  HCT 21.5* 24.7* 24.3*  MCV 94.2 93.9 94.5  PLT 148* 204 256   Lab Results  Component Value Date   TSH 1.070 06/05/2017   Lab Results  Component Value Date  HGBA1C 5.0 06/05/2017   Lab Results  Component Value Date   CHOL 179 03/21/2017   HDL 63 03/21/2017   LDLCALC 103 (H) 03/21/2017   TRIG 67 03/21/2017    Significant Diagnostic Results in last 30 days:  Ct Head Wo Contrast  Result Date: 01/17/2018 CLINICAL DATA:  Status post unwitnessed fall, with concern for head or cervical spine injury. EXAM: CT HEAD WITHOUT CONTRAST CT CERVICAL SPINE WITHOUT CONTRAST TECHNIQUE: Multidetector CT imaging of the head and cervical spine was performed following the standard protocol without intravenous contrast. Multiplanar CT image reconstructions of the cervical spine were also generated. COMPARISON:  CT of the head and cervical spine performed 12/26/2017 FINDINGS: CT HEAD FINDINGS Brain: No evidence of acute infarction, hemorrhage, hydrocephalus, extra-axial collection or mass lesion / mass effect. Prominence of the ventricles and sulci reflects moderate cortical volume loss. Mild cerebellar atrophy is noted. Diffuse periventricular and subcortical white matter change likely reflects small vessel ischemic microangiopathy. The brainstem and fourth ventricle are within normal limits. The basal ganglia are unremarkable in appearance. The cerebral hemispheres demonstrate grossly normal gray-white differentiation. No mass effect or midline shift is seen. Vascular: No hyperdense vessel or unexpected calcification. Skull: There is no evidence of fracture; visualized osseous structures are  unremarkable in appearance. Sinuses/Orbits: The visualized portions of the orbits are within normal limits. The patient is status post left-sided mastoidectomy. The paranasal sinuses and right mastoid air cells are well-aerated. Other: No significant soft tissue abnormalities are seen. CT CERVICAL SPINE FINDINGS Alignment: Normal. Skull base and vertebrae: No acute fracture. No primary bone lesion or focal pathologic process. There is incomplete fusion of the posterior arch of C1. Soft tissues and spinal canal: No prevertebral fluid or swelling. No visible canal hematoma. Disc levels: Mild multilevel disc space narrowing is noted along the cervical spine, with scattered small anterior and posterior disc osteophyte complexes. Upper chest: The visualized lung apices are clear. The visualized portions of the thyroid gland are unremarkable. Calcification is noted at the carotid bifurcations bilaterally. Other: No additional soft tissue abnormalities are seen. IMPRESSION: 1. No evidence of traumatic intracranial injury or fracture. 2. No evidence of fracture or subluxation along the cervical spine. 3. Moderate cortical volume loss and diffuse small vessel ischemic microangiopathy. 4. Mild degenerative change along the cervical spine. 5. Calcification at the carotid bifurcations bilaterally. Carotid ultrasound would be helpful for further evaluation, when and as deemed clinically appropriate. Electronically Signed   By: Garald Balding M.D.   On: 01/17/2018 23:16   Ct Cervical Spine Wo Contrast  Result Date: 01/17/2018 CLINICAL DATA:  Status post unwitnessed fall, with concern for head or cervical spine injury. EXAM: CT HEAD WITHOUT CONTRAST CT CERVICAL SPINE WITHOUT CONTRAST TECHNIQUE: Multidetector CT imaging of the head and cervical spine was performed following the standard protocol without intravenous contrast. Multiplanar CT image reconstructions of the cervical spine were also generated. COMPARISON:  CT of the  head and cervical spine performed 12/26/2017 FINDINGS: CT HEAD FINDINGS Brain: No evidence of acute infarction, hemorrhage, hydrocephalus, extra-axial collection or mass lesion / mass effect. Prominence of the ventricles and sulci reflects moderate cortical volume loss. Mild cerebellar atrophy is noted. Diffuse periventricular and subcortical white matter change likely reflects small vessel ischemic microangiopathy. The brainstem and fourth ventricle are within normal limits. The basal ganglia are unremarkable in appearance. The cerebral hemispheres demonstrate grossly normal gray-white differentiation. No mass effect or midline shift is seen. Vascular: No hyperdense vessel or unexpected calcification. Skull: There is  no evidence of fracture; visualized osseous structures are unremarkable in appearance. Sinuses/Orbits: The visualized portions of the orbits are within normal limits. The patient is status post left-sided mastoidectomy. The paranasal sinuses and right mastoid air cells are well-aerated. Other: No significant soft tissue abnormalities are seen. CT CERVICAL SPINE FINDINGS Alignment: Normal. Skull base and vertebrae: No acute fracture. No primary bone lesion or focal pathologic process. There is incomplete fusion of the posterior arch of C1. Soft tissues and spinal canal: No prevertebral fluid or swelling. No visible canal hematoma. Disc levels: Mild multilevel disc space narrowing is noted along the cervical spine, with scattered small anterior and posterior disc osteophyte complexes. Upper chest: The visualized lung apices are clear. The visualized portions of the thyroid gland are unremarkable. Calcification is noted at the carotid bifurcations bilaterally. Other: No additional soft tissue abnormalities are seen. IMPRESSION: 1. No evidence of traumatic intracranial injury or fracture. 2. No evidence of fracture or subluxation along the cervical spine. 3. Moderate cortical volume loss and diffuse small  vessel ischemic microangiopathy. 4. Mild degenerative change along the cervical spine. 5. Calcification at the carotid bifurcations bilaterally. Carotid ultrasound would be helpful for further evaluation, when and as deemed clinically appropriate. Electronically Signed   By: Garald Balding M.D.   On: 01/17/2018 23:16   Dg Chest Portable 1 View  Result Date: 01/17/2018 CLINICAL DATA:  Status post unwitnessed fall while walking to restroom, with concern for chest injury. Initial encounter. EXAM: PORTABLE CHEST 1 VIEW COMPARISON:  Chest radiograph performed 06/04/2017 FINDINGS: The lungs are well-aerated and clear. There is no evidence of focal opacification, pleural effusion or pneumothorax. The cardiomediastinal silhouette is within normal limits. No acute osseous abnormalities are seen. The liver is unremarkable in appearance. The patient is status post cholecystectomy, with clips noted at the gallbladder fossa. The common bile duct remains normal in caliber. IMPRESSION: No acute cardiopulmonary process seen. No displaced rib fractures identified. Electronically Signed   By: Garald Balding M.D.   On: 01/17/2018 23:34   Dg Hip Operative Unilat W Or W/o Pelvis Right  Result Date: 01/18/2018 CLINICAL DATA:  Right hip fracture EXAM: OPERATIVE right  HIP (WITH PELVIS IF PERFORMED) 8 VIEWS TECHNIQUE: Fluoroscopic spot image(s) were submitted for interpretation post-operatively. COMPARISON:  Radiograph 01/17/2018 FINDINGS: Eight low resolution intraoperative spot views of the right hip. Total fluoroscopy time was 30 seconds. Images were obtained during internal fixation of right intertrochanteric fracture. IMPRESSION: Intraoperative fluoroscopic assistance provided during surgical fixation of proximal right femur fracture Electronically Signed   By: Donavan Foil M.D.   On: 01/18/2018 20:35   Dg Hip Unilat W Or Wo Pelvis 2-3 Views Right  Result Date: 01/17/2018 CLINICAL DATA:  Status post unwitnessed fall, with  shortening and rotation at the right leg. Initial encounter. EXAM: DG HIP (WITH OR WITHOUT PELVIS) 2-3V RIGHT COMPARISON:  None. FINDINGS: There is a comminuted and mildly displaced right femoral intertrochanteric fracture, with a displaced lesser trochanteric fragment. The right femoral head remains seated at the acetabulum. The left hip joint is unremarkable in appearance. There is chronic deformity about the left-sided pubic rami. The sacroiliac joints are grossly unremarkable. The visualized bowel gas pattern is unremarkable. IMPRESSION: Comminuted and mildly displaced right femoral intertrochanteric fracture, with a displaced lesser trochanteric fragment. Electronically Signed   By: Garald Balding M.D.   On: 01/17/2018 23:33    Assessment/Plan  Closed fracture of right hip with routine healing, subsequent encounter  Status post hip surgery  Late onset  Alzheimer's disease with behavioral disturbance   Continue Pt/OT  Continue exercises as taught by PT/OT  Continue ice pack prn to the hip for pain, edema  Continue Tylenol 650 mg po QID scheduled for pain  Continue Tramadol 50 mg po TID scheduled for pain  Continue Tramadol 50 mg po Q 4 hours prn pain  Re-orient frequently, as necessary  Diversional activities  Monitor for Safety  Fall Precautions  WanderGuard for pt safety  Follow up with Orthopedist as instructed  Family/ staff Communication:   Total Time:  Documentation:  Face to Face:  Family/Phone:   Labs/tests ordered:  Cbc, met c  Medication list reviewed and assessed for continued appropriateness. Monthly medication orders reviewed and signed.  Vikki Ports, NP-C Geriatrics [facility] [phone], [place] [address] Cell Phone (Mon-Fri 8am-5pm):  [phone] On Call:  [phone] & follow prompts after 5pm & weekends Office Phone:  [phone] Office Fax:  [phone]

## 2018-02-06 ENCOUNTER — Other Ambulatory Visit
Admission: RE | Admit: 2018-02-06 | Discharge: 2018-02-06 | Disposition: A | Discharge status: Home / Self Care | Payer: PPO | Source: Ambulatory Visit | Attending: Gerontology | Admitting: Gerontology

## 2018-02-06 DIAGNOSIS — R5381 Other malaise: Secondary | ICD-10-CM | POA: Insufficient documentation

## 2018-02-06 DIAGNOSIS — R42 Dizziness and giddiness: Secondary | ICD-10-CM | POA: Insufficient documentation

## 2018-02-06 LAB — CBC
HCT: 32.9 % — ABNORMAL LOW (ref 35.0–47.0)
Hemoglobin: 10.7 g/dL — ABNORMAL LOW (ref 12.0–16.0)
MCH: 31.3 pg (ref 26.0–34.0)
MCHC: 32.7 g/dL (ref 32.0–36.0)
MCV: 95.7 fL (ref 80.0–100.0)
Platelets: 242 10*3/uL (ref 150–440)
RBC: 3.43 MIL/uL — AB (ref 3.80–5.20)
RDW: 15.4 % — AB (ref 11.5–14.5)
WBC: 5.8 10*3/uL (ref 3.6–11.0)

## 2018-02-07 LAB — COMPREHENSIVE METABOLIC PANEL
ALBUMIN: 3.2 g/dL — AB (ref 3.5–5.0)
ALK PHOS: 160 U/L — AB (ref 38–126)
ALT: 13 U/L — ABNORMAL LOW (ref 14–54)
ANION GAP: 10 (ref 5–15)
AST: 23 U/L (ref 15–41)
BUN: 30 mg/dL — ABNORMAL HIGH (ref 6–20)
CALCIUM: 8.6 mg/dL — AB (ref 8.9–10.3)
CO2: 25 mmol/L (ref 22–32)
CREATININE: 0.47 mg/dL (ref 0.44–1.00)
Chloride: 107 mmol/L (ref 101–111)
GFR calc Af Amer: >60 mL/min (ref >60)
GFR calc non Af Amer: >60 mL/min (ref >60)
GLUCOSE: 147 mg/dL — AB (ref 65–99)
Potassium: 3.6 mmol/L (ref 3.5–5.1)
SODIUM: 142 mmol/L (ref 135–145)
TOTAL PROTEIN: 5.7 g/dL — AB (ref 6.5–8.1)
Total Bilirubin: 0.6 mg/dL (ref 0.3–1.2)

## 2018-02-10 ENCOUNTER — Encounter: Payer: Self-pay | Admitting: Gerontology

## 2018-02-10 ENCOUNTER — Encounter
Admission: RE | Admit: 2018-02-10 | Discharge: 2018-02-10 | Disposition: A | Discharge status: Home / Self Care | Payer: PPO | Source: Ambulatory Visit | Attending: Internal Medicine | Admitting: Internal Medicine

## 2018-02-10 ENCOUNTER — Non-Acute Institutional Stay (SKILLED_NURSING_FACILITY): Payer: PPO | Admitting: Gerontology

## 2018-02-10 DIAGNOSIS — S72001D Fracture of unspecified part of neck of right femur, subsequent encounter for closed fracture with routine healing: Secondary | ICD-10-CM

## 2018-02-10 DIAGNOSIS — T17308A Unspecified foreign body in larynx causing other injury, initial encounter: Secondary | ICD-10-CM | POA: Diagnosis not present

## 2018-02-10 DIAGNOSIS — Z9889 Other specified postprocedural states: Secondary | ICD-10-CM | POA: Diagnosis not present

## 2018-02-10 NOTE — Progress Notes (Signed)
Location:   [facility] Room Number: 207B Place of Service:  SNF [phone]) Provider:  Toni Arthurs, NP-C  Patient, No Pcp Per  Patient Care Team: Patient, No Pcp Per as PCP - General (General Practice)  Extended Emergency Contact Information Primary Emergency Contact: Davis,Carrie G Address: [address] [place], [place] [address] Johnnette Litter of [place] Phone: [phone] Work Phone: [phone] Mobile Phone: [phone] Relation: Daughter Secondary Emergency Contact: Kerry Kass Address: [address]          [place], [place] [address] Johnnette Litter of [place] Phone: [phone] Relation: Son  Code Status:  FULL Goals of care: Advanced Directive information Advanced Directives 02/10/2018  Does Patient Have a Medical Advance Directive? Yes  Type of Advance Directive [place]  Does patient want to make changes to medical advance directive? No - Patient declined  Copy of [place] in Chart? Yes  Would patient like information on creating a medical advance directive? -     Chief Complaint  Patient presents with  . Medical Management of Chronic Issues    Routine Visit    HPI:  Pt is a 84 y.o. female seen today for medical management of chronic diseases. Pt was admitted to the facility for rehab following a mechanical fall with right hip fracture and subsequent IM nailing. Pt has been participating in PT/OT, though progress is limited by dementia. Pt reports pain is well controlled on current regimen. During lunch today, pt had a choking episode. This was witnessed by the CNA who performed the Heimlich Maneuver and expelled the food. Pt did not lose consciousness. Immediately after and a few hours after the incident, lungs were clear. No cough. No chest pain. No shortness of breath. Pt reports she is embarrassed by the incident. Otherwise, VSS. No other complaints.     Please note pt with  limited verbal/cognitive ability. Unable to obtain complete ROS. Some ROS info obtained from staff and documentation.    Past Medical History:  Diagnosis Date  . Age-related osteoporosis with current pathological fracture with routine healing 01/23/2018  . Alzheimer's dementia, late onset   . Cataract cortical, senile   . Depression   . Diverticulosis 11/22/2008  . Dizziness   . Heart murmur   . Hemorrhoids   . History of colonic polyps 11/22/2008  . Hx of migraine headaches   . Hypertension   . Late onset Alzheimer's disease with behavioral disturbance 01/23/2018  . Osteoarthritis   . Pelvic fracture ([place])   . Psoriasis, unspecified   . RBBB   . Thrombocytopenia ([facility])    mild  . Vitamin D deficiency disease    Past Surgical History:  Procedure Laterality Date  . ABDOMINAL HYSTERECTOMY    . APPENDECTOMY    . CATARACT EXTRACTION EXTRACAPSULAR  2007   [facility]  ([place])  . CATARACT EXTRACTION, BILATERAL    . CHOLECYSTECTOMY    . COLONOSCOPY W/ POLYPECTOMY     06/2002, 11/22/2008 Nicolasa Ducking / [facility])  . INTRAMEDULLARY (IM) NAIL INTERTROCHANTERIC Right 01/18/2018   Procedure: INTRAMEDULLARY (IM) NAIL INTERTROCHANTRIC ([facility]);  Surgeon: Lovell Sheehan, MD;  Location: [facility] ORS;  Service: Orthopedics;  Laterality: Right;  . MASTOIDECTOMY      No Known Allergies  Allergies as of 02/10/2018   No Known Allergies     Medication List        Accurate as of 02/10/18  2:19 PM. Always  use your most recent med list.          acetaminophen 325 MG tablet Commonly known as:  TYLENOL Take 650 mg by mouth 4 (four) times daily.   buPROPion 300 MG 24 hr tablet Commonly known as:  WELLBUTRIN XL Take 300 mg by mouth daily.   divalproex 250 MG DR tablet Commonly known as:  DEPAKOTE Take 1 tablet (250 mg total) by mouth 2 (two) times daily.   donepezil 10 MG tablet Commonly known as:  ARICEPT Take 1 tablet (10 mg total) by mouth at bedtime.   ENSURE ENLIVE PO Take 1 Bottle by mouth 2 (two)  times daily between meals.   feeding supplement (PRO-STAT SUGAR FREE 64) Liqd Take 30 mLs by mouth 2 (two) times daily between meals. low protein, low albumin   LORazepam 0.5 MG tablet Commonly known as:  ATIVAN Take 0.5 mg by mouth every 4 (four) hours as needed. for restlessness/agitation   mirtazapine 7.5 MG tablet Commonly known as:  REMERON Take 7.5 mg by mouth at bedtime.   QUEtiapine 25 MG tablet Commonly known as:  SEROQUEL Take 25 mg by mouth 2 (two) times daily.   traMADol 50 MG tablet Commonly known as:  ULTRAM Take 1 tablet (50 mg total) by mouth 3 (three) times daily. Hold for sedation   traMADol 50 MG tablet Commonly known as:  ULTRAM Take 1 tablet (50 mg total) by mouth every 4 (four) hours as needed for severe pain. Hold for sedation   vitamin B-12 500 MCG tablet Commonly known as:  CYANOCOBALAMIN Take 500 mcg by mouth daily.       Review of Systems  Unable to perform ROS: Dementia  Constitutional: Negative for activity change, appetite change, chills, diaphoresis and fever.  HENT: Negative for congestion, mouth sores, nosebleeds, postnasal drip, sneezing, sore throat, trouble swallowing and voice change.   Respiratory: Positive for choking. Negative for apnea, cough, chest tightness, shortness of breath and wheezing.   Cardiovascular: Negative for chest pain, palpitations and leg swelling.  Gastrointestinal: Negative for abdominal distention, abdominal pain, constipation, diarrhea and nausea.  Genitourinary: Negative for difficulty urinating, dysuria, frequency and urgency.  Musculoskeletal: Positive for arthralgias (typical arthritis) and gait problem. Negative for back pain and myalgias.  Skin: Positive for wound. Negative for color change, pallor and rash.  Neurological: Positive for weakness. Negative for dizziness, tremors, syncope, speech difficulty, numbness and headaches.  Psychiatric/Behavioral: Positive for dysphoric mood. Negative for agitation  and behavioral problems.  All other systems reviewed and are negative.   Immunization History  Administered Date(s) Administered  . Influenza, High Dose Seasonal PF 09/13/2016  . Influenza,inj,Quad PF,6+ Mos 02/05/2016  . Influenza-Unspecified 09/23/2017  . Pneumococcal Polysaccharide-23 02/05/2016  . Tdap 12/14/2011, 03/12/2017   Pertinent  Health Maintenance Due  Topic Date Due  . INFLUENZA VACCINE  Completed  . DEXA SCAN  Completed  . PNA vac Low Risk Adult  Completed   Fall Risk  12/23/2017 07/28/2017 06/22/2017 03/07/2017 09/07/2016  Falls in the past year? Yes Yes Yes No Yes  Number falls in past yr: 2 or more 2 or more 2 or more - 1  Injury with Fall? No Yes Yes - No  Risk Factor Category  High Fall Risk - High Fall Risk - -  Risk for fall due to : - - Impaired balance/gait;Impaired mobility - -  Follow up Falls evaluation completed Falls prevention discussed Falls evaluation completed;Education provided;Falls prevention discussed - Falls evaluation completed   Functional  Status Survey:    Vitals:   02/10/18 1404  BP: (!) 149/72  Pulse: 86  Resp: 20  Temp: 98.3 F (36.8 C)  TempSrc: Oral  SpO2: 96%  Weight: 119 lb (54 kg)  Height: 5\' 5"  (1.651 m)   Body mass index is 19.8 kg/m. Physical Exam  Constitutional: Vital signs are normal. She appears well-developed and well-nourished. She is active and cooperative. She does not appear ill. No distress.  HENT:  Head: Normocephalic and atraumatic.  Mouth/Throat: Uvula is midline, oropharynx is clear and moist and mucous membranes are normal. Mucous membranes are not pale, not dry and not cyanotic.  Eyes: Pupils are equal, round, and reactive to light. Conjunctivae, EOM and lids are normal.  Neck: Trachea normal, normal range of motion and full passive range of motion without pain. Neck supple. No JVD present. No tracheal deviation, no edema and no erythema present. No thyromegaly present.  Cardiovascular: Normal rate,  regular rhythm, normal heart sounds, intact distal pulses and normal pulses. Exam reveals no gallop, no distant heart sounds and no friction rub.  No murmur heard. Pulses:      Dorsalis pedis pulses are 2+ on the right side, and 2+ on the left side.  No edema  Pulmonary/Chest: Effort normal and breath sounds normal. No accessory muscle usage. No respiratory distress. She has no decreased breath sounds. She has no wheezes. She has no rhonchi. She has no rales. She exhibits no tenderness.  Abdominal: Soft. Normal appearance and bowel sounds are normal. She exhibits no distension and no ascites. There is no tenderness.  Musculoskeletal: She exhibits no edema.       Right hip: She exhibits decreased range of motion, decreased strength, tenderness and laceration.  Expected osteoarthritis, stiffness; Bilateral Calves soft, supple. Negative Homan's Sign. B- pedal pulses equal  Neurological: She is alert. She has normal strength. She exhibits abnormal muscle tone. Coordination and gait abnormal.  Skin: Skin is warm and dry. Laceration noted. She is not diaphoretic. No cyanosis. No pallor. Nails show no clubbing.  Psychiatric: Her speech is normal. Judgment and thought content normal. She is slowed. Cognition and memory are impaired. She exhibits a depressed mood. She exhibits abnormal recent memory.  Nursing note and vitals reviewed.   Labs reviewed: Recent Labs    01/24/18 0530 01/26/18 0910 02/06/18 2227  NA 143 146* 142  K 3.3* 3.7 3.6  CL 106 108 107  CO2 31 27 25   GLUCOSE 96 108* 147*  BUN 20 23* 30*  CREATININE 0.36* 0.36* 0.47  CALCIUM 8.3* 8.8* 8.6*   Recent Labs    01/24/18 0530 01/26/18 0910 02/06/18 2227  AST 23 29 23   ALT 19 19 13*  ALKPHOS 82 118 160*  BILITOT 1.0 1.1 0.6  PROT 5.0* 5.5* 5.7*  ALBUMIN 2.3* 2.6* 3.2*   Recent Labs    01/24/18 0530 01/24/18 1307 01/26/18 0910 02/06/18 1830  WBC 5.9 7.9 6.1 5.8  NEUTROABS 3.9 5.7 4.3  --   HGB 7.1* 8.2* 8.0* 10.7*   HCT 21.5* 24.7* 24.3* 32.9*  MCV 94.2 93.9 94.5 95.7  PLT 148* 204 256 242   Lab Results  Component Value Date   TSH 1.070 06/05/2017   Lab Results  Component Value Date   HGBA1C 5.0 06/05/2017   Lab Results  Component Value Date   CHOL 179 03/21/2017   HDL 63 03/21/2017   LDLCALC 103 (H) 03/21/2017   TRIG 67 03/21/2017    Significant Diagnostic Results in  last 30 days:  Ct Head Wo Contrast  Result Date: 01/17/2018 CLINICAL DATA:  Status post unwitnessed fall, with concern for head or cervical spine injury. EXAM: CT HEAD WITHOUT CONTRAST CT CERVICAL SPINE WITHOUT CONTRAST TECHNIQUE: Multidetector CT imaging of the head and cervical spine was performed following the standard protocol without intravenous contrast. Multiplanar CT image reconstructions of the cervical spine were also generated. COMPARISON:  CT of the head and cervical spine performed 12/26/2017 FINDINGS: CT HEAD FINDINGS Brain: No evidence of acute infarction, hemorrhage, hydrocephalus, extra-axial collection or mass lesion / mass effect. Prominence of the ventricles and sulci reflects moderate cortical volume loss. Mild cerebellar atrophy is noted. Diffuse periventricular and subcortical white matter change likely reflects small vessel ischemic microangiopathy. The brainstem and fourth ventricle are within normal limits. The basal ganglia are unremarkable in appearance. The cerebral hemispheres demonstrate grossly normal gray-white differentiation. No mass effect or midline shift is seen. Vascular: No hyperdense vessel or unexpected calcification. Skull: There is no evidence of fracture; visualized osseous structures are unremarkable in appearance. Sinuses/Orbits: The visualized portions of the orbits are within normal limits. The patient is status post left-sided mastoidectomy. The paranasal sinuses and right mastoid air cells are well-aerated. Other: No significant soft tissue abnormalities are seen. CT CERVICAL SPINE FINDINGS  Alignment: Normal. Skull base and vertebrae: No acute fracture. No primary bone lesion or focal pathologic process. There is incomplete fusion of the posterior arch of C1. Soft tissues and spinal canal: No prevertebral fluid or swelling. No visible canal hematoma. Disc levels: Mild multilevel disc space narrowing is noted along the cervical spine, with scattered small anterior and posterior disc osteophyte complexes. Upper chest: The visualized lung apices are clear. The visualized portions of the thyroid gland are unremarkable. Calcification is noted at the carotid bifurcations bilaterally. Other: No additional soft tissue abnormalities are seen. IMPRESSION: 1. No evidence of traumatic intracranial injury or fracture. 2. No evidence of fracture or subluxation along the cervical spine. 3. Moderate cortical volume loss and diffuse small vessel ischemic microangiopathy. 4. Mild degenerative change along the cervical spine. 5. Calcification at the carotid bifurcations bilaterally. Carotid ultrasound would be helpful for further evaluation, when and as deemed clinically appropriate. Electronically Signed   By: Garald Balding M.D.   On: 01/17/2018 23:16   Ct Cervical Spine Wo Contrast  Result Date: 01/17/2018 CLINICAL DATA:  Status post unwitnessed fall, with concern for head or cervical spine injury. EXAM: CT HEAD WITHOUT CONTRAST CT CERVICAL SPINE WITHOUT CONTRAST TECHNIQUE: Multidetector CT imaging of the head and cervical spine was performed following the standard protocol without intravenous contrast. Multiplanar CT image reconstructions of the cervical spine were also generated. COMPARISON:  CT of the head and cervical spine performed 12/26/2017 FINDINGS: CT HEAD FINDINGS Brain: No evidence of acute infarction, hemorrhage, hydrocephalus, extra-axial collection or mass lesion / mass effect. Prominence of the ventricles and sulci reflects moderate cortical volume loss. Mild cerebellar atrophy is noted. Diffuse  periventricular and subcortical white matter change likely reflects small vessel ischemic microangiopathy. The brainstem and fourth ventricle are within normal limits. The basal ganglia are unremarkable in appearance. The cerebral hemispheres demonstrate grossly normal gray-white differentiation. No mass effect or midline shift is seen. Vascular: No hyperdense vessel or unexpected calcification. Skull: There is no evidence of fracture; visualized osseous structures are unremarkable in appearance. Sinuses/Orbits: The visualized portions of the orbits are within normal limits. The patient is status post left-sided mastoidectomy. The paranasal sinuses and right mastoid air cells are well-aerated.  Other: No significant soft tissue abnormalities are seen. CT CERVICAL SPINE FINDINGS Alignment: Normal. Skull base and vertebrae: No acute fracture. No primary bone lesion or focal pathologic process. There is incomplete fusion of the posterior arch of C1. Soft tissues and spinal canal: No prevertebral fluid or swelling. No visible canal hematoma. Disc levels: Mild multilevel disc space narrowing is noted along the cervical spine, with scattered small anterior and posterior disc osteophyte complexes. Upper chest: The visualized lung apices are clear. The visualized portions of the thyroid gland are unremarkable. Calcification is noted at the carotid bifurcations bilaterally. Other: No additional soft tissue abnormalities are seen. IMPRESSION: 1. No evidence of traumatic intracranial injury or fracture. 2. No evidence of fracture or subluxation along the cervical spine. 3. Moderate cortical volume loss and diffuse small vessel ischemic microangiopathy. 4. Mild degenerative change along the cervical spine. 5. Calcification at the carotid bifurcations bilaterally. Carotid ultrasound would be helpful for further evaluation, when and as deemed clinically appropriate. Electronically Signed   By: Garald Balding M.D.   On: 01/17/2018  23:16   Dg Chest Portable 1 View  Result Date: 01/17/2018 CLINICAL DATA:  Status post unwitnessed fall while walking to restroom, with concern for chest injury. Initial encounter. EXAM: PORTABLE CHEST 1 VIEW COMPARISON:  Chest radiograph performed 06/04/2017 FINDINGS: The lungs are well-aerated and clear. There is no evidence of focal opacification, pleural effusion or pneumothorax. The cardiomediastinal silhouette is within normal limits. No acute osseous abnormalities are seen. The liver is unremarkable in appearance. The patient is status post cholecystectomy, with clips noted at the gallbladder fossa. The common bile duct remains normal in caliber. IMPRESSION: No acute cardiopulmonary process seen. No displaced rib fractures identified. Electronically Signed   By: Garald Balding M.D.   On: 01/17/2018 23:34   Dg Hip Operative Unilat W Or W/o Pelvis Right  Result Date: 01/18/2018 CLINICAL DATA:  Right hip fracture EXAM: OPERATIVE right  HIP (WITH PELVIS IF PERFORMED) 8 VIEWS TECHNIQUE: Fluoroscopic spot image(s) were submitted for interpretation post-operatively. COMPARISON:  Radiograph 01/17/2018 FINDINGS: Eight low resolution intraoperative spot views of the right hip. Total fluoroscopy time was 30 seconds. Images were obtained during internal fixation of right intertrochanteric fracture. IMPRESSION: Intraoperative fluoroscopic assistance provided during surgical fixation of proximal right femur fracture Electronically Signed   By: Donavan Foil M.D.   On: 01/18/2018 20:35   Dg Hip Unilat W Or Wo Pelvis 2-3 Views Right  Result Date: 01/17/2018 CLINICAL DATA:  Status post unwitnessed fall, with shortening and rotation at the right leg. Initial encounter. EXAM: DG HIP (WITH OR WITHOUT PELVIS) 2-3V RIGHT COMPARISON:  None. FINDINGS: There is a comminuted and mildly displaced right femoral intertrochanteric fracture, with a displaced lesser trochanteric fragment. The right femoral head remains seated at the  acetabulum. The left hip joint is unremarkable in appearance. There is chronic deformity about the left-sided pubic rami. The sacroiliac joints are grossly unremarkable. The visualized bowel gas pattern is unremarkable. IMPRESSION: Comminuted and mildly displaced right femoral intertrochanteric fracture, with a displaced lesser trochanteric fragment. Electronically Signed   By: Garald Balding M.D.   On: 01/17/2018 23:33    Assessment/Plan  Closed fracture of right hip with routine healing, subsequent encounter Status post hip surgery  Continue PT/OT  Continue exercises as taught by PT/OT  Continue ice pack to the hip prn for pain, edema  Continue APAP 650 mg po QID scheduled for pain  Continue Tramadol 50 mg po TID scheduled for pain  Continue  Tramadol 50 mg po Q 4 hours prn pain  Skin care per protocol  Follow up with orthopedist as instructed  Choking, initial encounter  Monitor closely for s/s of aspiration pna  Monitor for cough, fever, congestion, ams, etc  SLP to follow up  Family/ staff Communication:   Total Time:  Documentation:  Face to Face:  Family/Phone:   Labs/tests ordered:   Medication list reviewed and assessed for continued appropriateness. Monthly medication orders reviewed and signed.  Vikki Ports, NP-C Geriatrics [facility] [phone] [place], [place] [address] Cell Phone (Mon-Fri 8am-5pm):  [phone] On Call:  [phone] & follow prompts after 5pm & weekends Office Phone:  [phone] Office Fax:  [phone]

## 2018-02-15 DIAGNOSIS — E119 Type 2 diabetes mellitus without complications: Secondary | ICD-10-CM | POA: Diagnosis not present

## 2018-02-15 DIAGNOSIS — E559 Vitamin D deficiency, unspecified: Secondary | ICD-10-CM | POA: Diagnosis not present

## 2018-02-15 DIAGNOSIS — E038 Other specified hypothyroidism: Secondary | ICD-10-CM | POA: Diagnosis not present

## 2018-02-15 DIAGNOSIS — Z79899 Other long term (current) drug therapy: Secondary | ICD-10-CM | POA: Diagnosis not present

## 2018-02-15 DIAGNOSIS — D518 Other vitamin B12 deficiency anemias: Secondary | ICD-10-CM | POA: Diagnosis not present

## 2018-02-15 DIAGNOSIS — E7849 Other hyperlipidemia: Secondary | ICD-10-CM | POA: Diagnosis not present

## 2018-02-17 DIAGNOSIS — I10 Essential (primary) hypertension: Secondary | ICD-10-CM | POA: Diagnosis not present

## 2018-02-17 DIAGNOSIS — I739 Peripheral vascular disease, unspecified: Secondary | ICD-10-CM | POA: Diagnosis not present

## 2018-02-17 DIAGNOSIS — E559 Vitamin D deficiency, unspecified: Secondary | ICD-10-CM | POA: Diagnosis not present

## 2018-02-17 DIAGNOSIS — G301 Alzheimer's disease with late onset: Secondary | ICD-10-CM | POA: Diagnosis not present

## 2018-02-22 DIAGNOSIS — M25551 Pain in right hip: Secondary | ICD-10-CM | POA: Diagnosis not present

## 2018-02-22 DIAGNOSIS — R262 Difficulty in walking, not elsewhere classified: Secondary | ICD-10-CM | POA: Diagnosis not present

## 2018-02-22 DIAGNOSIS — M25651 Stiffness of right hip, not elsewhere classified: Secondary | ICD-10-CM | POA: Diagnosis not present

## 2018-02-27 DIAGNOSIS — M25551 Pain in right hip: Secondary | ICD-10-CM | POA: Diagnosis not present

## 2018-02-27 DIAGNOSIS — M25651 Stiffness of right hip, not elsewhere classified: Secondary | ICD-10-CM | POA: Diagnosis not present

## 2018-02-27 DIAGNOSIS — R262 Difficulty in walking, not elsewhere classified: Secondary | ICD-10-CM | POA: Diagnosis not present

## 2018-03-02 DIAGNOSIS — Z96641 Presence of right artificial hip joint: Secondary | ICD-10-CM | POA: Diagnosis not present

## 2018-03-03 DIAGNOSIS — M25471 Effusion, right ankle: Secondary | ICD-10-CM | POA: Diagnosis not present

## 2018-03-03 DIAGNOSIS — M25651 Stiffness of right hip, not elsewhere classified: Secondary | ICD-10-CM | POA: Diagnosis not present

## 2018-03-03 DIAGNOSIS — M25551 Pain in right hip: Secondary | ICD-10-CM | POA: Diagnosis not present

## 2018-03-03 DIAGNOSIS — R262 Difficulty in walking, not elsewhere classified: Secondary | ICD-10-CM | POA: Diagnosis not present

## 2018-03-07 DIAGNOSIS — M25651 Stiffness of right hip, not elsewhere classified: Secondary | ICD-10-CM | POA: Diagnosis not present

## 2018-03-07 DIAGNOSIS — M25551 Pain in right hip: Secondary | ICD-10-CM | POA: Diagnosis not present

## 2018-03-07 DIAGNOSIS — R262 Difficulty in walking, not elsewhere classified: Secondary | ICD-10-CM | POA: Diagnosis not present

## 2018-03-09 DIAGNOSIS — M25551 Pain in right hip: Secondary | ICD-10-CM | POA: Diagnosis not present

## 2018-03-09 DIAGNOSIS — R262 Difficulty in walking, not elsewhere classified: Secondary | ICD-10-CM | POA: Diagnosis not present

## 2018-03-09 DIAGNOSIS — M25651 Stiffness of right hip, not elsewhere classified: Secondary | ICD-10-CM | POA: Diagnosis not present

## 2018-03-13 DIAGNOSIS — M25551 Pain in right hip: Secondary | ICD-10-CM | POA: Diagnosis not present

## 2018-03-13 DIAGNOSIS — R262 Difficulty in walking, not elsewhere classified: Secondary | ICD-10-CM | POA: Diagnosis not present

## 2018-03-13 DIAGNOSIS — S72141D Displaced intertrochanteric fracture of right femur, subsequent encounter for closed fracture with routine healing: Secondary | ICD-10-CM | POA: Diagnosis not present

## 2018-03-13 DIAGNOSIS — M6281 Muscle weakness (generalized): Secondary | ICD-10-CM | POA: Diagnosis not present

## 2018-03-13 DIAGNOSIS — F0281 Dementia in other diseases classified elsewhere with behavioral disturbance: Secondary | ICD-10-CM | POA: Diagnosis not present

## 2018-03-13 DIAGNOSIS — M25651 Stiffness of right hip, not elsewhere classified: Secondary | ICD-10-CM | POA: Diagnosis not present

## 2018-03-13 DIAGNOSIS — R269 Unspecified abnormalities of gait and mobility: Secondary | ICD-10-CM | POA: Diagnosis not present

## 2018-03-16 DIAGNOSIS — R262 Difficulty in walking, not elsewhere classified: Secondary | ICD-10-CM | POA: Diagnosis not present

## 2018-03-16 DIAGNOSIS — M25551 Pain in right hip: Secondary | ICD-10-CM | POA: Diagnosis not present

## 2018-03-16 DIAGNOSIS — M25651 Stiffness of right hip, not elsewhere classified: Secondary | ICD-10-CM | POA: Diagnosis not present

## 2018-03-21 DIAGNOSIS — Z9889 Other specified postprocedural states: Secondary | ICD-10-CM | POA: Insufficient documentation

## 2018-03-23 DIAGNOSIS — R262 Difficulty in walking, not elsewhere classified: Secondary | ICD-10-CM | POA: Diagnosis not present

## 2018-03-23 DIAGNOSIS — M25651 Stiffness of right hip, not elsewhere classified: Secondary | ICD-10-CM | POA: Diagnosis not present

## 2018-03-23 DIAGNOSIS — M25551 Pain in right hip: Secondary | ICD-10-CM | POA: Diagnosis not present

## 2018-03-24 DIAGNOSIS — R262 Difficulty in walking, not elsewhere classified: Secondary | ICD-10-CM | POA: Diagnosis not present

## 2018-03-24 DIAGNOSIS — I10 Essential (primary) hypertension: Secondary | ICD-10-CM | POA: Diagnosis not present

## 2018-03-24 DIAGNOSIS — G301 Alzheimer's disease with late onset: Secondary | ICD-10-CM | POA: Diagnosis not present

## 2018-03-24 DIAGNOSIS — E538 Deficiency of other specified B group vitamins: Secondary | ICD-10-CM | POA: Diagnosis not present

## 2018-03-24 DIAGNOSIS — M25651 Stiffness of right hip, not elsewhere classified: Secondary | ICD-10-CM | POA: Diagnosis not present

## 2018-03-24 DIAGNOSIS — D696 Thrombocytopenia, unspecified: Secondary | ICD-10-CM | POA: Diagnosis not present

## 2018-03-24 DIAGNOSIS — M25551 Pain in right hip: Secondary | ICD-10-CM | POA: Diagnosis not present

## 2018-03-27 NOTE — Progress Notes (Signed)
The patient is seen in neurologic consultation at the request of Dr. Sanda Klein for the evaluation of memory.  The patient is accompanied by her daughter and son who supplement the history.  The patient is a 84 y.o. year old female who has had memory issues for about 6 months.  She noted that she would have to write things down.   Memory loss apparently got worse after the death of her son last 08/25/23 (who had downs syndrome).  She was referred to psychiatry for MDD in 02/2016 but they state that for some reason, an appt wasn't made.  Her family notes that she has called them multiple times a day for the last 6 weeks and asked them when her husband was coming home but he has been dead for 6 years.  The patient lives alone with her dog and she is able to take care of her dog.   The patient does do the finances in the home and reports that she has no trouble with that.  The patient does drive and she reports that she has no difficulty with that but family reports that they haven't driven with her.  There have not been any motor vehicle accidents in the recent years.  The patient does cook.  There is no difficulty remembering common recipes.  Her cooking still tastes good.    The stovetop has not been left on accidentally.  The patient is able to perform her own ADL's.  The patient is able to distribute her own medications but states that she is only on a vitamin and antidepressant but her daughter does report that she was on a medication for UTI and she did get confused with that.  The patients bladder and bowel are under good control.  There have been no behavioral changes over the years.  There have been no hallucinations.  Fam hx of memory issue in her mother.  09/07/16 update:  Pt f/u today, accompanied by her daughter who supplement the history.  she saw Dr. Si Raider for neuropscyhometric testing on 06/08/16 and subsequently had a feedback session with her where results and recommendations were given to her.   These are detailed within the chart.  This confirmed the diagnosis of Alzheimer's dementia.  She talked to the patient about needing a driving evaluation, if the patient wanted to continue driving.  Last visit, I had asked her to stop driving.  She states that she has not driven.  Daughter states that they had an issue with the car and didn't get it fixed.  She had an MRI of the brain on 05/18/2016 that I had the opportunity to review.  There was mild atrophy and moderate small vessel disease.  Her B12 was rechecked since last visit and was now normal at 534.  She quit taking those in July.   Aricept was started last visit.  She is tolerating it well, without SE.  Her daughter is administering this for a month and before that she may not have been taking it.  She was dizzy but started drinking protein shakes (premier) and it helped.    03/07/17 update:  Patient follows up today, accompanied by her daughter who supplements the history.  Last visit, we talked about the fact that the patient should no longer be living alone.  Daughter reports that she now has a caregiver from 10am-5pm.  Daughter does state that she is eating better with caregivers.  Pt still insists that her husband is great and  he is just at work (he is deceased). She is on Aricept.  She tolerates the medication well, without side effects.  Patient's daughter called me in February, as Seroquel was prescribed the patient and the patient's daughter had read about the blackbox warning. That medication hasn't helped yet.  Biggest issue is angry/agitation.  One fall 3 weeks ago because was dehydrated.   06/22/17 update:  Patient seen in follow-up today.  She is accompanied by her daughter who supplements the history.  Patient is on Aricept.  Last visit, Depakote was started for mood/anger management.  She is on 250 mg twice a day.  She was already on Seroquel, 25 mg at night, which I did not change last visit.  The records that were made available to me  were reviewed.  Has been evaluated at the hospital multiple times for falls.  Several of these falls she hit her head.  She has had multiple CTs of the head since last visit.  Fortunately, these were all nonacute.  The most recent fall resulted in an admission to the hospital from 6/23-6/26/18.  Had MRI of the brain on 06/05/17 and was non acute.   Pt was d/c to [facility].  Was previously at assisted level living and now at SNF level for rehab (was not at [facility] previously).  Daughter unsure if this is permanent situation but thinks that it likely will be.   Hospital started her on risperdal 1 mg prn bid in addition to seroquel.  Her daughter called me about that and we went ahead and d/c the seroquel since daughter wasn't sure how helpful it had been.  SNF sent meds and neither seroquel nor risperdal on list but agitation better.  On remeron.  No longer walking, even with PT.  Daughter states that [facility] has done x-rays of the hip.  Pt is c/o hip pain.  Has had several falls since back to SNF  12/23/17 update: Patient is seen today in follow-up for memory change.  She is accompanied by her daughter who supplements the history.  Patient is on donepezil, 10 mg daily.  She is also on Depakote, 250 mg twice per day.  The patients daughter reports that her behavior and mood have been good and "its like a 180."  I have reviewed records since last visit.  She was in the emergency room on October 04, 2017 for fall.  Blood pressure was 70/40 at the nursing facility, but was improved upon arrival to the emergency room.  She was found to have a urinary tract infection and was treated with Keflex.  CT of the brain was nonacute.  Daughter states that she fell yesterday and hit her head and was checked at the SNF.  She also fell twice over the weekend.  She is now in PT.  She saw her sister at [place] who is in another facility and she recognized her sister and they had a good time.    03/28/18 update: Patient is  seen today in follow-up.  This patient is accompanied in the office by her child who supplements the history. She returns today for a new issue, which is dizziness.  Daughter states she thought that the reason was for falls but she thinks that falls were due to fact that wellbutrin was d/c.  Falls are better.  Patient and daughter both deny dizziness today.  She did have this in the past, but that has resolved.  Records have been reviewed since our  last visit.  She has been either in the emergency room or admitted to the hospital several times since our last visit.  She went to the emergency room on December 26, 2017 after a fall.  She did hit her head.  Likewise, she went back to the emergency room on January 17, 2018 because of a fall without her walker and was admitted.  She fractured her right hip with this fall.  She had surgery on January 18, 2018 and was discharged on January 21, 2018.  Patient did have a CT of the brain on January 17, 2018 due to fall.  This was nonacute.  There were incidental findings of carotid calcifications and ultrasound was recommended.   This was a previous issue.   In terms of memory change and behavioral issues, the patient remains on Aricept, 10 mg daily as well as Depakote, 250 mg twice per day.  She resides in a subacute nursing facility.  She is back on seroquel, now at 25 mg bid.  Daughter thinks that this was done right after surgery for sundowning but daughter would like her to be off of it.  Daughter also thinks that tramadol was contributing right after surgery, but once d/c that was better.    Allergies: No Known Allergies  Current Outpatient Medications on File Prior to Visit  Medication Sig Dispense Refill  . acetaminophen (TYLENOL) 325 MG tablet Take 650 mg by mouth 4 (four) times daily.     . Amino Acids-Protein Hydrolys (FEEDING SUPPLEMENT, PRO-STAT SUGAR FREE 64,) LIQD Take 30 mLs by mouth 2 (two) times daily between meals. low protein, low albumin    .  buPROPion (WELLBUTRIN XL) 300 MG 24 hr tablet Take 300 mg by mouth daily.    . divalproex (DEPAKOTE) 250 MG DR tablet Take 1 tablet (250 mg total) by mouth 2 (two) times daily. 180 tablet 0  . donepezil (ARICEPT) 10 MG tablet Take 1 tablet (10 mg total) by mouth at bedtime. 30 tablet 2  . lactose free nutrition (BOOST) LIQD Take 237 mLs by mouth 3 (three) times daily between meals.    Marland Kitchen LORazepam (ATIVAN) 0.5 MG tablet Take 0.5 mg by mouth every 4 (four) hours as needed. for restlessness/agitation    . mirtazapine (REMERON) 7.5 MG tablet Take 7.5 mg by mouth at bedtime.     Marland Kitchen QUEtiapine (SEROQUEL) 25 MG tablet Take 25 mg by mouth 2 (two) times daily.    . vitamin B-12 (CYANOCOBALAMIN) 500 MCG tablet Take 500 mcg by mouth daily.    . Vitamin D, Ergocalciferol, (DRISDOL) 50000 units CAPS capsule Take 50,000 Units by mouth every 7 (seven) days.     No current facility-administered medications on file prior to visit.      Past Medical History:  Diagnosis Date  . Age-related osteoporosis with current pathological fracture with routine healing 01/23/2018  . Alzheimer's dementia, late onset   . Cataract cortical, senile   . Depression   . Diverticulosis 11/22/2008  . Dizziness   . Heart murmur   . Hemorrhoids   . History of colonic polyps 11/22/2008  . Hx of migraine headaches   . Hypertension   . Late onset Alzheimer's disease with behavioral disturbance 01/23/2018  . Osteoarthritis   . Pelvic fracture ([employer])   . Psoriasis, unspecified   . RBBB   . Thrombocytopenia (HCC)    mild  . Vitamin D deficiency disease     Past Surgical History:  Procedure Laterality Date  .  ABDOMINAL HYSTERECTOMY    . APPENDECTOMY    . CATARACT EXTRACTION EXTRACAPSULAR  2007   [facility]  ([place])  . CATARACT EXTRACTION, BILATERAL    . CHOLECYSTECTOMY    . COLONOSCOPY W/ POLYPECTOMY     06/2002, 11/22/2008 Nicolas[facility] Ducking / [facility])  . INTRAMEDULLARY (IM) NAIL INTERTROCHANTERIC Right 01/18/2018   Procedure: INTRAMEDULLARY  (IM) NAIL INTERTROCHANTRIC ([facility]);  Surgeon: Lovell Sheehan, MD;  Location: [facility] ORS;  Service: Orthopedics;  Laterality: Right;  . MASTOIDECTOMY      Social History   Socioeconomic History  . Marital status: Widowed    Spouse name: Not on file  . Number of children: 3  . Years of education: 68  . Highest education level: High school graduate  Occupational History  . Occupation: retired    Comment: Dance movement psychotherapist  Social Needs  . Financial resource strain: Not on file  . Food insecurity:    Worry: Not on file    Inability: Not on file  . Transportation needs:    Medical: Not on file    Non-medical: Not on file  Tobacco Use  . Smoking status: Former Smoker    Packs/day: 0.50    Years: 15.00    Pack years: 7.50    Types: Cigarettes    Last attempt to quit: 12/14/2007    Years since quitting: 10.2  . Smokeless tobacco: Never Used  Substance and Sexual Activity  . Alcohol use: No    Alcohol/week: 0.0 oz  . Drug use: No  . Sexual activity: Not on file  Lifestyle  . Physical activity:    Days per week: Not on file    Minutes per session: Not on file  . Stress: Not on file  Relationships  . Social connections:    Talks on phone: Not on file    Gets together: Not on file    Attends religious service: Not on file    Active member of club or organization: Not on file    Attends meetings of clubs or organizations: Not on file    Relationship status: Not on file  . Intimate partner violence:    Fear of current or ex partner: Not on file    Emotionally abused: Not on file    Physically abused: Not on file    Forced sexual activity: Not on file  Other Topics Concern  . Not on file  Social History Narrative  . Not on file    Family Status  Relation Name Status  . Mother  Deceased       heart disease  . Father  Deceased       unknown  . Sister  Alive       atrial fib, rheumatoid  . Sister  Alive       unknown  . Brother  Alive       unknown  . Daughter  Alive        healthy  . Son  Alive       healthy  . Son  Deceased       down syndrome  . Sister  (Not Specified)    ROS:  A complete 10 system ROS was obtained and was unremarkable except as above.   VITALS:   Vitals:   03/28/18 1059  SpO2: 96%    Wt Readings from Last 3 Encounters:  02/10/18 119 lb (54 kg)  02/03/18 128 lb 1.6 oz (58.1 kg)  01/21/18 120 lb (54.4 kg)    HEENT:  Normocephalic, atraumatic. The mucous membranes are moist. The superficial temporal arteries are without ropiness or tenderness. Cardiovascular: Regular rate and rhythm. Lungs: Clear to auscultation bilaterally. Neck: There are no carotid bruits noted bilaterally.  NEUROLOGICAL:  Orientation:  She is alert today.  She doesn't know where she is or why she is here.  She does not answer questions appropriately.  She is interactive. Montreal Cognitive Assessment  05/03/2016  Visuospatial/ Executive (0/5) 2  Naming (0/3) 2  Attention: Read list of digits (0/2) 2  Attention: Read list of letters (0/1) 1  Attention: Serial 7 subtraction starting at 100 (0/3) 1  Language: Repeat phrase (0/2) 2  Language : Fluency (0/1) 0  Abstraction (0/2) 2  Delayed Recall (0/5) 0  Orientation (0/6) 3  Total 15  Adjusted Score (based on education) 15    Cranial nerves: There is good facial symmetry.  Extraocular muscles are intact and visual fields are full to confrontational testing. Speech is fluent and clear. Soft palate rises symmetrically and there is no tongue deviation. Hearing is intact to conversational tone. Tone: Tone is good throughout. Sensation: Sensation is intact to light touch throughout. Coordination:  The patient has no difficulty with RAM's or FNF bilaterally. Motor: Strength is at least 4/5 in the bilateral upper and lower extremities.  She has cognitive difficulty participating with manual muscle testing. Gait and Station: Did not ambulate the patient today.  We did attempt to do orthostatics while  standing and she was unsteady and feels uncomfortable putting any weight on the hip that she fractured.  LABS:    Chemistry      Component Value Date/Time   NA 142 02/06/2018 2227   NA 146 (H) 03/21/2017 1059   NA 141 03/06/2012 1912   K 3.6 02/06/2018 2227   K 3.5 03/06/2012 1912   CL 107 02/06/2018 2227   CL 104 03/06/2012 1912   CO2 25 02/06/2018 2227   CO2 25 03/06/2012 1912   BUN 30 (H) 02/06/2018 2227   BUN 12 03/21/2017 1059   BUN 17 03/06/2012 1912   CREATININE 0.47 02/06/2018 2227   CREATININE 0.58 (L) 03/06/2012 1912      Component Value Date/Time   CALCIUM 8.6 (L) 02/06/2018 2227   CALCIUM 9.0 03/06/2012 1912   ALKPHOS 160 (H) 02/06/2018 2227   ALKPHOS 144 (H) 03/06/2012 1912   AST 23 02/06/2018 2227   AST 38 (H) 03/06/2012 1912   ALT 13 (L) 02/06/2018 2227   ALT 30 03/06/2012 1912   BILITOT 0.6 02/06/2018 2227   BILITOT 0.6 03/21/2017 1059   BILITOT 2.1 (H) 03/06/2012 1912     Lab Results  Component Value Date   VITAMINB12 556 06/05/2017   IMPRESSIONS/PLAN:  1.  Alzheimer's dementia  -she saw Dr. Si Raider for neuropscyhometric testing on 06/08/16 and subsequently had a feedback session with her where results and recommendations were given to her.  These are detailed within the chart.  This confirmed the diagnosis of Alzheimer's dementia  -We discussed the importance of physical and mental exercises and I explained to the patient and family what this means.    -We'll continue on donepezil.  -Told her daughter to talk to the subacute nursing facility about her Seroquel.  She was on this, then it was changed to Risperdal, then everything was discontinued and she is back on it now.  Her daughter thinks that was because of agitation the day after surgery for her hip fracture, but daughter is under the impression  that is better now.  If able, I would like to see her off of the daytime Seroquel.  I asked the nursing facility to assess this.  -on VPA - 250 mg bid and  doing well on that.  Risks, benefits, side effects and alternative therapies were discussed.  The opportunity to ask questions was given and they were answered to the best of my ability.  The patient expressed understanding and willingness to follow the outlined treatment protocols.  -engaging in activities at SNF  2.  B12 deficiency  -on oral supplementation now  3.  Dizziness  -Patient and daughter both deny that this is an issue.  We did discuss the importance of hydration and exactly what that means.  -We will do ultrasound of the carotids to make sure there is nothing there that is contributing.  Even if there was, I am not sure she is a surgical candidate and we discussed this today.  Regardless, she recently had a CT of the head 2 months ago after a fall which demonstrated bilateral carotid calcifications and ultrasound was recommended.  4.  Follow-up as needed.  Much greater than 50% of this visit was spent in counseling and coordinating care.  Total face to face time:  25 min

## 2018-03-28 ENCOUNTER — Encounter: Payer: Self-pay | Admitting: Neurology

## 2018-03-28 ENCOUNTER — Ambulatory Visit (INDEPENDENT_AMBULATORY_CARE_PROVIDER_SITE_OTHER): Payer: PPO | Admitting: Neurology

## 2018-03-28 DIAGNOSIS — F0281 Dementia in other diseases classified elsewhere with behavioral disturbance: Secondary | ICD-10-CM

## 2018-03-28 DIAGNOSIS — E538 Deficiency of other specified B group vitamins: Secondary | ICD-10-CM

## 2018-03-28 DIAGNOSIS — R42 Dizziness and giddiness: Secondary | ICD-10-CM

## 2018-03-28 DIAGNOSIS — G301 Alzheimer's disease with late onset: Secondary | ICD-10-CM | POA: Diagnosis not present

## 2018-03-28 DIAGNOSIS — I6529 Occlusion and stenosis of unspecified carotid artery: Secondary | ICD-10-CM | POA: Diagnosis not present

## 2018-03-28 DIAGNOSIS — F02818 Dementia in other diseases classified elsewhere, unspecified severity, with other behavioral disturbance: Secondary | ICD-10-CM

## 2018-03-28 NOTE — Patient Instructions (Addendum)
1.  Please assess need for daytime seroquel.  Does patient need it twice per day?

## 2018-03-29 ENCOUNTER — Encounter: Payer: Self-pay | Admitting: Neurology

## 2018-03-29 DIAGNOSIS — M25651 Stiffness of right hip, not elsewhere classified: Secondary | ICD-10-CM | POA: Diagnosis not present

## 2018-03-29 DIAGNOSIS — R262 Difficulty in walking, not elsewhere classified: Secondary | ICD-10-CM | POA: Diagnosis not present

## 2018-03-29 DIAGNOSIS — M25551 Pain in right hip: Secondary | ICD-10-CM | POA: Diagnosis not present

## 2018-03-30 DIAGNOSIS — M25651 Stiffness of right hip, not elsewhere classified: Secondary | ICD-10-CM | POA: Diagnosis not present

## 2018-03-30 DIAGNOSIS — R262 Difficulty in walking, not elsewhere classified: Secondary | ICD-10-CM | POA: Diagnosis not present

## 2018-03-30 DIAGNOSIS — M25551 Pain in right hip: Secondary | ICD-10-CM | POA: Diagnosis not present

## 2018-03-31 DIAGNOSIS — R262 Difficulty in walking, not elsewhere classified: Secondary | ICD-10-CM | POA: Diagnosis not present

## 2018-03-31 DIAGNOSIS — M25551 Pain in right hip: Secondary | ICD-10-CM | POA: Diagnosis not present

## 2018-03-31 DIAGNOSIS — M25651 Stiffness of right hip, not elsewhere classified: Secondary | ICD-10-CM | POA: Diagnosis not present

## 2018-04-03 DIAGNOSIS — M25651 Stiffness of right hip, not elsewhere classified: Secondary | ICD-10-CM | POA: Diagnosis not present

## 2018-04-03 DIAGNOSIS — R262 Difficulty in walking, not elsewhere classified: Secondary | ICD-10-CM | POA: Diagnosis not present

## 2018-04-03 DIAGNOSIS — M25551 Pain in right hip: Secondary | ICD-10-CM | POA: Diagnosis not present

## 2018-04-06 DIAGNOSIS — M25551 Pain in right hip: Secondary | ICD-10-CM | POA: Diagnosis not present

## 2018-04-06 DIAGNOSIS — R262 Difficulty in walking, not elsewhere classified: Secondary | ICD-10-CM | POA: Diagnosis not present

## 2018-04-06 DIAGNOSIS — M25651 Stiffness of right hip, not elsewhere classified: Secondary | ICD-10-CM | POA: Diagnosis not present

## 2018-04-10 DIAGNOSIS — M25551 Pain in right hip: Secondary | ICD-10-CM | POA: Diagnosis not present

## 2018-04-10 DIAGNOSIS — M25651 Stiffness of right hip, not elsewhere classified: Secondary | ICD-10-CM | POA: Diagnosis not present

## 2018-04-10 DIAGNOSIS — R262 Difficulty in walking, not elsewhere classified: Secondary | ICD-10-CM | POA: Diagnosis not present

## 2018-04-11 DIAGNOSIS — S7291XA Unspecified fracture of right femur, initial encounter for closed fracture: Secondary | ICD-10-CM | POA: Diagnosis not present

## 2018-04-12 DIAGNOSIS — F22 Delusional disorders: Secondary | ICD-10-CM | POA: Diagnosis not present

## 2018-04-12 DIAGNOSIS — M6281 Muscle weakness (generalized): Secondary | ICD-10-CM | POA: Diagnosis not present

## 2018-04-12 DIAGNOSIS — S72141D Displaced intertrochanteric fracture of right femur, subsequent encounter for closed fracture with routine healing: Secondary | ICD-10-CM | POA: Diagnosis not present

## 2018-04-12 DIAGNOSIS — F0280 Dementia in other diseases classified elsewhere without behavioral disturbance: Secondary | ICD-10-CM | POA: Diagnosis not present

## 2018-04-12 DIAGNOSIS — R269 Unspecified abnormalities of gait and mobility: Secondary | ICD-10-CM | POA: Diagnosis not present

## 2018-04-12 DIAGNOSIS — G301 Alzheimer's disease with late onset: Secondary | ICD-10-CM | POA: Diagnosis not present

## 2018-04-12 DIAGNOSIS — F0281 Dementia in other diseases classified elsewhere with behavioral disturbance: Secondary | ICD-10-CM | POA: Diagnosis not present

## 2018-04-12 DIAGNOSIS — F329 Major depressive disorder, single episode, unspecified: Secondary | ICD-10-CM | POA: Diagnosis not present

## 2018-04-14 DIAGNOSIS — I739 Peripheral vascular disease, unspecified: Secondary | ICD-10-CM | POA: Diagnosis not present

## 2018-04-14 DIAGNOSIS — L84 Corns and callosities: Secondary | ICD-10-CM | POA: Diagnosis not present

## 2018-04-14 DIAGNOSIS — M25551 Pain in right hip: Secondary | ICD-10-CM | POA: Diagnosis not present

## 2018-04-14 DIAGNOSIS — M25651 Stiffness of right hip, not elsewhere classified: Secondary | ICD-10-CM | POA: Diagnosis not present

## 2018-04-14 DIAGNOSIS — B351 Tinea unguium: Secondary | ICD-10-CM | POA: Diagnosis not present

## 2018-04-14 DIAGNOSIS — R262 Difficulty in walking, not elsewhere classified: Secondary | ICD-10-CM | POA: Diagnosis not present

## 2018-04-17 DIAGNOSIS — E119 Type 2 diabetes mellitus without complications: Secondary | ICD-10-CM | POA: Diagnosis not present

## 2018-04-17 DIAGNOSIS — E782 Mixed hyperlipidemia: Secondary | ICD-10-CM | POA: Diagnosis not present

## 2018-04-17 DIAGNOSIS — Z79899 Other long term (current) drug therapy: Secondary | ICD-10-CM | POA: Diagnosis not present

## 2018-04-17 DIAGNOSIS — D518 Other vitamin B12 deficiency anemias: Secondary | ICD-10-CM | POA: Diagnosis not present

## 2018-04-18 DIAGNOSIS — R262 Difficulty in walking, not elsewhere classified: Secondary | ICD-10-CM | POA: Diagnosis not present

## 2018-04-18 DIAGNOSIS — M25651 Stiffness of right hip, not elsewhere classified: Secondary | ICD-10-CM | POA: Diagnosis not present

## 2018-04-18 DIAGNOSIS — M25551 Pain in right hip: Secondary | ICD-10-CM | POA: Diagnosis not present

## 2018-04-21 DIAGNOSIS — G301 Alzheimer's disease with late onset: Secondary | ICD-10-CM | POA: Diagnosis not present

## 2018-04-21 DIAGNOSIS — E559 Vitamin D deficiency, unspecified: Secondary | ICD-10-CM | POA: Diagnosis not present

## 2018-04-21 DIAGNOSIS — M25551 Pain in right hip: Secondary | ICD-10-CM | POA: Diagnosis not present

## 2018-04-21 DIAGNOSIS — R262 Difficulty in walking, not elsewhere classified: Secondary | ICD-10-CM | POA: Diagnosis not present

## 2018-04-21 DIAGNOSIS — I10 Essential (primary) hypertension: Secondary | ICD-10-CM | POA: Diagnosis not present

## 2018-04-21 DIAGNOSIS — I739 Peripheral vascular disease, unspecified: Secondary | ICD-10-CM | POA: Diagnosis not present

## 2018-04-21 DIAGNOSIS — M25651 Stiffness of right hip, not elsewhere classified: Secondary | ICD-10-CM | POA: Diagnosis not present

## 2018-04-26 ENCOUNTER — Ambulatory Visit (INDEPENDENT_AMBULATORY_CARE_PROVIDER_SITE_OTHER): Payer: PPO

## 2018-04-26 DIAGNOSIS — F0280 Dementia in other diseases classified elsewhere without behavioral disturbance: Secondary | ICD-10-CM | POA: Diagnosis not present

## 2018-04-26 DIAGNOSIS — I6529 Occlusion and stenosis of unspecified carotid artery: Secondary | ICD-10-CM

## 2018-04-26 DIAGNOSIS — M25651 Stiffness of right hip, not elsewhere classified: Secondary | ICD-10-CM | POA: Diagnosis not present

## 2018-04-26 DIAGNOSIS — G301 Alzheimer's disease with late onset: Secondary | ICD-10-CM | POA: Diagnosis not present

## 2018-04-26 DIAGNOSIS — M25551 Pain in right hip: Secondary | ICD-10-CM | POA: Diagnosis not present

## 2018-04-26 DIAGNOSIS — R262 Difficulty in walking, not elsewhere classified: Secondary | ICD-10-CM | POA: Diagnosis not present

## 2018-04-26 DIAGNOSIS — F22 Delusional disorders: Secondary | ICD-10-CM | POA: Diagnosis not present

## 2018-04-26 DIAGNOSIS — F329 Major depressive disorder, single episode, unspecified: Secondary | ICD-10-CM | POA: Diagnosis not present

## 2018-04-26 DIAGNOSIS — R42 Dizziness and giddiness: Secondary | ICD-10-CM | POA: Diagnosis not present

## 2018-04-27 ENCOUNTER — Telehealth: Payer: Self-pay | Admitting: Neurology

## 2018-04-27 NOTE — Telephone Encounter (Signed)
-----   Message from Langley, DO sent at 04/27/2018  1:10 PM EDT ----- Let pt know that u/s looked good

## 2018-04-27 NOTE — Telephone Encounter (Signed)
Mychart message sent to patient.

## 2018-04-28 DIAGNOSIS — M25551 Pain in right hip: Secondary | ICD-10-CM | POA: Diagnosis not present

## 2018-05-01 DIAGNOSIS — M25551 Pain in right hip: Secondary | ICD-10-CM | POA: Diagnosis not present

## 2018-05-05 DIAGNOSIS — R262 Difficulty in walking, not elsewhere classified: Secondary | ICD-10-CM | POA: Diagnosis not present

## 2018-05-05 DIAGNOSIS — M25651 Stiffness of right hip, not elsewhere classified: Secondary | ICD-10-CM | POA: Diagnosis not present

## 2018-05-05 DIAGNOSIS — M25551 Pain in right hip: Secondary | ICD-10-CM | POA: Diagnosis not present

## 2018-05-09 DIAGNOSIS — R262 Difficulty in walking, not elsewhere classified: Secondary | ICD-10-CM | POA: Diagnosis not present

## 2018-05-09 DIAGNOSIS — M25551 Pain in right hip: Secondary | ICD-10-CM | POA: Diagnosis not present

## 2018-05-09 DIAGNOSIS — M25651 Stiffness of right hip, not elsewhere classified: Secondary | ICD-10-CM | POA: Diagnosis not present

## 2018-05-13 DIAGNOSIS — F0281 Dementia in other diseases classified elsewhere with behavioral disturbance: Secondary | ICD-10-CM | POA: Diagnosis not present

## 2018-05-13 DIAGNOSIS — M6281 Muscle weakness (generalized): Secondary | ICD-10-CM | POA: Diagnosis not present

## 2018-05-13 DIAGNOSIS — S72141D Displaced intertrochanteric fracture of right femur, subsequent encounter for closed fracture with routine healing: Secondary | ICD-10-CM | POA: Diagnosis not present

## 2018-05-13 DIAGNOSIS — R269 Unspecified abnormalities of gait and mobility: Secondary | ICD-10-CM | POA: Diagnosis not present

## 2018-05-15 DIAGNOSIS — E7849 Other hyperlipidemia: Secondary | ICD-10-CM | POA: Diagnosis not present

## 2018-05-15 DIAGNOSIS — E559 Vitamin D deficiency, unspecified: Secondary | ICD-10-CM | POA: Diagnosis not present

## 2018-05-15 DIAGNOSIS — Z79899 Other long term (current) drug therapy: Secondary | ICD-10-CM | POA: Diagnosis not present

## 2018-05-15 DIAGNOSIS — E119 Type 2 diabetes mellitus without complications: Secondary | ICD-10-CM | POA: Diagnosis not present

## 2018-05-15 DIAGNOSIS — E038 Other specified hypothyroidism: Secondary | ICD-10-CM | POA: Diagnosis not present

## 2018-05-15 DIAGNOSIS — D518 Other vitamin B12 deficiency anemias: Secondary | ICD-10-CM | POA: Diagnosis not present

## 2018-05-17 DIAGNOSIS — F0280 Dementia in other diseases classified elsewhere without behavioral disturbance: Secondary | ICD-10-CM | POA: Diagnosis not present

## 2018-05-17 DIAGNOSIS — G301 Alzheimer's disease with late onset: Secondary | ICD-10-CM | POA: Diagnosis not present

## 2018-05-17 DIAGNOSIS — F22 Delusional disorders: Secondary | ICD-10-CM | POA: Diagnosis not present

## 2018-05-19 DIAGNOSIS — E559 Vitamin D deficiency, unspecified: Secondary | ICD-10-CM | POA: Diagnosis not present

## 2018-05-19 DIAGNOSIS — G301 Alzheimer's disease with late onset: Secondary | ICD-10-CM | POA: Diagnosis not present

## 2018-05-19 DIAGNOSIS — I739 Peripheral vascular disease, unspecified: Secondary | ICD-10-CM | POA: Diagnosis not present

## 2018-05-19 DIAGNOSIS — I10 Essential (primary) hypertension: Secondary | ICD-10-CM | POA: Diagnosis not present

## 2018-05-24 DIAGNOSIS — G301 Alzheimer's disease with late onset: Secondary | ICD-10-CM | POA: Diagnosis not present

## 2018-05-24 DIAGNOSIS — F0280 Dementia in other diseases classified elsewhere without behavioral disturbance: Secondary | ICD-10-CM | POA: Diagnosis not present

## 2018-05-24 DIAGNOSIS — F329 Major depressive disorder, single episode, unspecified: Secondary | ICD-10-CM | POA: Diagnosis not present

## 2018-05-24 DIAGNOSIS — F22 Delusional disorders: Secondary | ICD-10-CM | POA: Diagnosis not present

## 2018-06-05 DIAGNOSIS — I714 Abdominal aortic aneurysm, without rupture: Secondary | ICD-10-CM | POA: Diagnosis not present

## 2018-06-05 DIAGNOSIS — R0989 Other specified symptoms and signs involving the circulatory and respiratory systems: Secondary | ICD-10-CM | POA: Diagnosis not present

## 2018-06-07 DIAGNOSIS — I739 Peripheral vascular disease, unspecified: Secondary | ICD-10-CM | POA: Diagnosis not present

## 2018-06-07 DIAGNOSIS — R221 Localized swelling, mass and lump, neck: Secondary | ICD-10-CM | POA: Diagnosis not present

## 2018-06-09 DIAGNOSIS — F0281 Dementia in other diseases classified elsewhere with behavioral disturbance: Secondary | ICD-10-CM | POA: Diagnosis not present

## 2018-06-09 DIAGNOSIS — R296 Repeated falls: Secondary | ICD-10-CM | POA: Diagnosis not present

## 2018-06-09 DIAGNOSIS — G301 Alzheimer's disease with late onset: Secondary | ICD-10-CM | POA: Diagnosis not present

## 2018-06-12 DIAGNOSIS — S72141D Displaced intertrochanteric fracture of right femur, subsequent encounter for closed fracture with routine healing: Secondary | ICD-10-CM | POA: Diagnosis not present

## 2018-06-12 DIAGNOSIS — F0281 Dementia in other diseases classified elsewhere with behavioral disturbance: Secondary | ICD-10-CM | POA: Diagnosis not present

## 2018-06-12 DIAGNOSIS — M6281 Muscle weakness (generalized): Secondary | ICD-10-CM | POA: Diagnosis not present

## 2018-06-12 DIAGNOSIS — R269 Unspecified abnormalities of gait and mobility: Secondary | ICD-10-CM | POA: Diagnosis not present

## 2018-06-16 DIAGNOSIS — I739 Peripheral vascular disease, unspecified: Secondary | ICD-10-CM | POA: Diagnosis not present

## 2018-06-16 DIAGNOSIS — E538 Deficiency of other specified B group vitamins: Secondary | ICD-10-CM | POA: Diagnosis not present

## 2018-06-16 DIAGNOSIS — I10 Essential (primary) hypertension: Secondary | ICD-10-CM | POA: Diagnosis not present

## 2018-06-16 DIAGNOSIS — G301 Alzheimer's disease with late onset: Secondary | ICD-10-CM | POA: Diagnosis not present

## 2018-07-13 DIAGNOSIS — F0281 Dementia in other diseases classified elsewhere with behavioral disturbance: Secondary | ICD-10-CM | POA: Diagnosis not present

## 2018-07-13 DIAGNOSIS — S72141D Displaced intertrochanteric fracture of right femur, subsequent encounter for closed fracture with routine healing: Secondary | ICD-10-CM | POA: Diagnosis not present

## 2018-07-13 DIAGNOSIS — R269 Unspecified abnormalities of gait and mobility: Secondary | ICD-10-CM | POA: Diagnosis not present

## 2018-07-13 DIAGNOSIS — M6281 Muscle weakness (generalized): Secondary | ICD-10-CM | POA: Diagnosis not present

## 2018-07-14 DIAGNOSIS — I739 Peripheral vascular disease, unspecified: Secondary | ICD-10-CM | POA: Diagnosis not present

## 2018-07-14 DIAGNOSIS — E559 Vitamin D deficiency, unspecified: Secondary | ICD-10-CM | POA: Diagnosis not present

## 2018-07-14 DIAGNOSIS — G301 Alzheimer's disease with late onset: Secondary | ICD-10-CM | POA: Diagnosis not present

## 2018-07-14 DIAGNOSIS — I10 Essential (primary) hypertension: Secondary | ICD-10-CM | POA: Diagnosis not present

## 2018-07-22 NOTE — Telephone Encounter (Signed)
No longer a pt at [facility] 

## 2018-08-11 DIAGNOSIS — E559 Vitamin D deficiency, unspecified: Secondary | ICD-10-CM | POA: Diagnosis not present

## 2018-08-11 DIAGNOSIS — I739 Peripheral vascular disease, unspecified: Secondary | ICD-10-CM | POA: Diagnosis not present

## 2018-08-11 DIAGNOSIS — I10 Essential (primary) hypertension: Secondary | ICD-10-CM | POA: Diagnosis not present

## 2018-08-11 DIAGNOSIS — G301 Alzheimer's disease with late onset: Secondary | ICD-10-CM | POA: Diagnosis not present

## 2018-08-13 DIAGNOSIS — R269 Unspecified abnormalities of gait and mobility: Secondary | ICD-10-CM | POA: Diagnosis not present

## 2018-08-13 DIAGNOSIS — S72141D Displaced intertrochanteric fracture of right femur, subsequent encounter for closed fracture with routine healing: Secondary | ICD-10-CM | POA: Diagnosis not present

## 2018-08-13 DIAGNOSIS — F0281 Dementia in other diseases classified elsewhere with behavioral disturbance: Secondary | ICD-10-CM | POA: Diagnosis not present

## 2018-08-13 DIAGNOSIS — M6281 Muscle weakness (generalized): Secondary | ICD-10-CM | POA: Diagnosis not present

## 2018-08-23 DIAGNOSIS — E559 Vitamin D deficiency, unspecified: Secondary | ICD-10-CM | POA: Diagnosis not present

## 2018-08-23 DIAGNOSIS — E038 Other specified hypothyroidism: Secondary | ICD-10-CM | POA: Diagnosis not present

## 2018-08-23 DIAGNOSIS — D518 Other vitamin B12 deficiency anemias: Secondary | ICD-10-CM | POA: Diagnosis not present

## 2018-08-23 DIAGNOSIS — Z79899 Other long term (current) drug therapy: Secondary | ICD-10-CM | POA: Diagnosis not present

## 2018-08-23 DIAGNOSIS — E119 Type 2 diabetes mellitus without complications: Secondary | ICD-10-CM | POA: Diagnosis not present

## 2018-08-23 DIAGNOSIS — E7849 Other hyperlipidemia: Secondary | ICD-10-CM | POA: Diagnosis not present

## 2018-08-31 DIAGNOSIS — R748 Abnormal levels of other serum enzymes: Secondary | ICD-10-CM | POA: Diagnosis not present

## 2018-09-08 DIAGNOSIS — I10 Essential (primary) hypertension: Secondary | ICD-10-CM | POA: Diagnosis not present

## 2018-09-08 DIAGNOSIS — E559 Vitamin D deficiency, unspecified: Secondary | ICD-10-CM | POA: Diagnosis not present

## 2018-09-08 DIAGNOSIS — G301 Alzheimer's disease with late onset: Secondary | ICD-10-CM | POA: Diagnosis not present

## 2018-09-08 DIAGNOSIS — I739 Peripheral vascular disease, unspecified: Secondary | ICD-10-CM | POA: Diagnosis not present

## 2018-09-12 DIAGNOSIS — M6281 Muscle weakness (generalized): Secondary | ICD-10-CM | POA: Diagnosis not present

## 2018-09-12 DIAGNOSIS — F0281 Dementia in other diseases classified elsewhere with behavioral disturbance: Secondary | ICD-10-CM | POA: Diagnosis not present

## 2018-09-12 DIAGNOSIS — S72141D Displaced intertrochanteric fracture of right femur, subsequent encounter for closed fracture with routine healing: Secondary | ICD-10-CM | POA: Diagnosis not present

## 2018-09-12 DIAGNOSIS — R269 Unspecified abnormalities of gait and mobility: Secondary | ICD-10-CM | POA: Diagnosis not present

## 2018-10-05 DIAGNOSIS — E559 Vitamin D deficiency, unspecified: Secondary | ICD-10-CM | POA: Diagnosis not present

## 2018-10-05 DIAGNOSIS — I10 Essential (primary) hypertension: Secondary | ICD-10-CM | POA: Diagnosis not present

## 2018-10-05 DIAGNOSIS — G301 Alzheimer's disease with late onset: Secondary | ICD-10-CM | POA: Diagnosis not present

## 2018-10-05 DIAGNOSIS — I739 Peripheral vascular disease, unspecified: Secondary | ICD-10-CM | POA: Diagnosis not present

## 2018-10-13 DIAGNOSIS — M6281 Muscle weakness (generalized): Secondary | ICD-10-CM | POA: Diagnosis not present

## 2018-10-13 DIAGNOSIS — R269 Unspecified abnormalities of gait and mobility: Secondary | ICD-10-CM | POA: Diagnosis not present

## 2018-10-13 DIAGNOSIS — S72141D Displaced intertrochanteric fracture of right femur, subsequent encounter for closed fracture with routine healing: Secondary | ICD-10-CM | POA: Diagnosis not present

## 2018-10-13 DIAGNOSIS — F0281 Dementia in other diseases classified elsewhere with behavioral disturbance: Secondary | ICD-10-CM | POA: Diagnosis not present

## 2018-11-03 DIAGNOSIS — I10 Essential (primary) hypertension: Secondary | ICD-10-CM | POA: Diagnosis not present

## 2018-11-03 DIAGNOSIS — E538 Deficiency of other specified B group vitamins: Secondary | ICD-10-CM | POA: Diagnosis not present

## 2018-11-03 DIAGNOSIS — I739 Peripheral vascular disease, unspecified: Secondary | ICD-10-CM | POA: Diagnosis not present

## 2018-11-03 DIAGNOSIS — G301 Alzheimer's disease with late onset: Secondary | ICD-10-CM | POA: Diagnosis not present

## 2018-11-12 DIAGNOSIS — S72141D Displaced intertrochanteric fracture of right femur, subsequent encounter for closed fracture with routine healing: Secondary | ICD-10-CM | POA: Diagnosis not present

## 2018-11-12 DIAGNOSIS — R269 Unspecified abnormalities of gait and mobility: Secondary | ICD-10-CM | POA: Diagnosis not present

## 2018-11-12 DIAGNOSIS — M6281 Muscle weakness (generalized): Secondary | ICD-10-CM | POA: Diagnosis not present

## 2018-11-12 DIAGNOSIS — F0281 Dementia in other diseases classified elsewhere with behavioral disturbance: Secondary | ICD-10-CM | POA: Diagnosis not present

## 2018-11-23 DIAGNOSIS — E038 Other specified hypothyroidism: Secondary | ICD-10-CM | POA: Diagnosis not present

## 2018-11-23 DIAGNOSIS — Z79899 Other long term (current) drug therapy: Secondary | ICD-10-CM | POA: Diagnosis not present

## 2018-11-23 DIAGNOSIS — D518 Other vitamin B12 deficiency anemias: Secondary | ICD-10-CM | POA: Diagnosis not present

## 2018-11-23 DIAGNOSIS — E119 Type 2 diabetes mellitus without complications: Secondary | ICD-10-CM | POA: Diagnosis not present

## 2018-11-23 DIAGNOSIS — E7849 Other hyperlipidemia: Secondary | ICD-10-CM | POA: Diagnosis not present

## 2018-11-23 DIAGNOSIS — E559 Vitamin D deficiency, unspecified: Secondary | ICD-10-CM | POA: Diagnosis not present

## 2018-11-24 DIAGNOSIS — I10 Essential (primary) hypertension: Secondary | ICD-10-CM | POA: Diagnosis not present

## 2018-11-24 DIAGNOSIS — E559 Vitamin D deficiency, unspecified: Secondary | ICD-10-CM | POA: Diagnosis not present

## 2018-11-24 DIAGNOSIS — G301 Alzheimer's disease with late onset: Secondary | ICD-10-CM | POA: Diagnosis not present

## 2018-11-24 DIAGNOSIS — F329 Major depressive disorder, single episode, unspecified: Secondary | ICD-10-CM | POA: Diagnosis not present

## 2018-12-13 DIAGNOSIS — F0281 Dementia in other diseases classified elsewhere with behavioral disturbance: Secondary | ICD-10-CM | POA: Diagnosis not present

## 2018-12-13 DIAGNOSIS — R269 Unspecified abnormalities of gait and mobility: Secondary | ICD-10-CM | POA: Diagnosis not present

## 2018-12-13 DIAGNOSIS — M6281 Muscle weakness (generalized): Secondary | ICD-10-CM | POA: Diagnosis not present

## 2018-12-13 DIAGNOSIS — S72141D Displaced intertrochanteric fracture of right femur, subsequent encounter for closed fracture with routine healing: Secondary | ICD-10-CM | POA: Diagnosis not present

## 2018-12-22 DIAGNOSIS — G301 Alzheimer's disease with late onset: Secondary | ICD-10-CM | POA: Diagnosis not present

## 2018-12-22 DIAGNOSIS — E559 Vitamin D deficiency, unspecified: Secondary | ICD-10-CM | POA: Diagnosis not present

## 2018-12-22 DIAGNOSIS — I10 Essential (primary) hypertension: Secondary | ICD-10-CM | POA: Diagnosis not present

## 2018-12-22 DIAGNOSIS — E538 Deficiency of other specified B group vitamins: Secondary | ICD-10-CM | POA: Diagnosis not present

## 2019-01-05 DIAGNOSIS — I10 Essential (primary) hypertension: Secondary | ICD-10-CM | POA: Diagnosis not present

## 2019-01-05 DIAGNOSIS — I739 Peripheral vascular disease, unspecified: Secondary | ICD-10-CM | POA: Diagnosis not present

## 2019-01-05 DIAGNOSIS — M1990 Unspecified osteoarthritis, unspecified site: Secondary | ICD-10-CM | POA: Diagnosis not present

## 2019-01-05 DIAGNOSIS — G301 Alzheimer's disease with late onset: Secondary | ICD-10-CM | POA: Diagnosis not present

## 2019-01-13 DIAGNOSIS — F0281 Dementia in other diseases classified elsewhere with behavioral disturbance: Secondary | ICD-10-CM | POA: Diagnosis not present

## 2019-01-13 DIAGNOSIS — R269 Unspecified abnormalities of gait and mobility: Secondary | ICD-10-CM | POA: Diagnosis not present

## 2019-01-13 DIAGNOSIS — S72141D Displaced intertrochanteric fracture of right femur, subsequent encounter for closed fracture with routine healing: Secondary | ICD-10-CM | POA: Diagnosis not present

## 2019-01-13 DIAGNOSIS — M6281 Muscle weakness (generalized): Secondary | ICD-10-CM | POA: Diagnosis not present

## 2019-01-19 DIAGNOSIS — F0281 Dementia in other diseases classified elsewhere with behavioral disturbance: Secondary | ICD-10-CM | POA: Diagnosis not present

## 2019-02-09 DIAGNOSIS — F0280 Dementia in other diseases classified elsewhere without behavioral disturbance: Secondary | ICD-10-CM | POA: Diagnosis not present

## 2019-02-09 DIAGNOSIS — R269 Unspecified abnormalities of gait and mobility: Secondary | ICD-10-CM | POA: Diagnosis not present

## 2019-02-09 DIAGNOSIS — M159 Polyosteoarthritis, unspecified: Secondary | ICD-10-CM | POA: Diagnosis not present

## 2019-02-11 DIAGNOSIS — S72141D Displaced intertrochanteric fracture of right femur, subsequent encounter for closed fracture with routine healing: Secondary | ICD-10-CM | POA: Diagnosis not present

## 2019-02-11 DIAGNOSIS — M6281 Muscle weakness (generalized): Secondary | ICD-10-CM | POA: Diagnosis not present

## 2019-02-11 DIAGNOSIS — R269 Unspecified abnormalities of gait and mobility: Secondary | ICD-10-CM | POA: Diagnosis not present

## 2019-02-11 DIAGNOSIS — F0281 Dementia in other diseases classified elsewhere with behavioral disturbance: Secondary | ICD-10-CM | POA: Diagnosis not present

## 2019-02-15 DIAGNOSIS — Z79899 Other long term (current) drug therapy: Secondary | ICD-10-CM | POA: Diagnosis not present

## 2019-02-15 DIAGNOSIS — E559 Vitamin D deficiency, unspecified: Secondary | ICD-10-CM | POA: Diagnosis not present

## 2019-02-15 DIAGNOSIS — D518 Other vitamin B12 deficiency anemias: Secondary | ICD-10-CM | POA: Diagnosis not present

## 2019-02-15 DIAGNOSIS — E038 Other specified hypothyroidism: Secondary | ICD-10-CM | POA: Diagnosis not present

## 2019-02-15 DIAGNOSIS — E7849 Other hyperlipidemia: Secondary | ICD-10-CM | POA: Diagnosis not present

## 2019-02-15 DIAGNOSIS — E119 Type 2 diabetes mellitus without complications: Secondary | ICD-10-CM | POA: Diagnosis not present

## 2019-03-20 DIAGNOSIS — I739 Peripheral vascular disease, unspecified: Secondary | ICD-10-CM | POA: Diagnosis not present

## 2019-03-20 DIAGNOSIS — M159 Polyosteoarthritis, unspecified: Secondary | ICD-10-CM | POA: Diagnosis not present

## 2019-03-20 DIAGNOSIS — G301 Alzheimer's disease with late onset: Secondary | ICD-10-CM | POA: Diagnosis not present

## 2019-04-17 DIAGNOSIS — I10 Essential (primary) hypertension: Secondary | ICD-10-CM | POA: Diagnosis not present

## 2019-04-17 DIAGNOSIS — F329 Major depressive disorder, single episode, unspecified: Secondary | ICD-10-CM | POA: Diagnosis not present

## 2019-04-17 DIAGNOSIS — E559 Vitamin D deficiency, unspecified: Secondary | ICD-10-CM | POA: Diagnosis not present

## 2019-04-17 DIAGNOSIS — F0281 Dementia in other diseases classified elsewhere with behavioral disturbance: Secondary | ICD-10-CM | POA: Diagnosis not present

## 2019-05-15 DIAGNOSIS — I10 Essential (primary) hypertension: Secondary | ICD-10-CM | POA: Diagnosis not present

## 2019-05-15 DIAGNOSIS — D638 Anemia in other chronic diseases classified elsewhere: Secondary | ICD-10-CM | POA: Diagnosis not present

## 2019-05-15 DIAGNOSIS — M159 Polyosteoarthritis, unspecified: Secondary | ICD-10-CM | POA: Diagnosis not present

## 2019-05-15 DIAGNOSIS — B351 Tinea unguium: Secondary | ICD-10-CM | POA: Diagnosis not present

## 2019-05-15 DIAGNOSIS — I739 Peripheral vascular disease, unspecified: Secondary | ICD-10-CM | POA: Diagnosis not present

## 2019-05-15 DIAGNOSIS — F0280 Dementia in other diseases classified elsewhere without behavioral disturbance: Secondary | ICD-10-CM | POA: Diagnosis not present

## 2019-05-17 DIAGNOSIS — E7849 Other hyperlipidemia: Secondary | ICD-10-CM | POA: Diagnosis not present

## 2019-05-17 DIAGNOSIS — E559 Vitamin D deficiency, unspecified: Secondary | ICD-10-CM | POA: Diagnosis not present

## 2019-05-17 DIAGNOSIS — E119 Type 2 diabetes mellitus without complications: Secondary | ICD-10-CM | POA: Diagnosis not present

## 2019-05-17 DIAGNOSIS — E038 Other specified hypothyroidism: Secondary | ICD-10-CM | POA: Diagnosis not present

## 2019-05-17 DIAGNOSIS — D518 Other vitamin B12 deficiency anemias: Secondary | ICD-10-CM | POA: Diagnosis not present

## 2019-05-17 DIAGNOSIS — Z79899 Other long term (current) drug therapy: Secondary | ICD-10-CM | POA: Diagnosis not present

## 2019-06-11 DIAGNOSIS — R011 Cardiac murmur, unspecified: Secondary | ICD-10-CM | POA: Diagnosis not present

## 2019-06-12 DIAGNOSIS — I10 Essential (primary) hypertension: Secondary | ICD-10-CM | POA: Diagnosis not present

## 2019-06-12 DIAGNOSIS — D638 Anemia in other chronic diseases classified elsewhere: Secondary | ICD-10-CM | POA: Diagnosis not present

## 2019-06-12 DIAGNOSIS — E559 Vitamin D deficiency, unspecified: Secondary | ICD-10-CM | POA: Diagnosis not present

## 2019-06-12 DIAGNOSIS — M159 Polyosteoarthritis, unspecified: Secondary | ICD-10-CM | POA: Diagnosis not present

## 2019-07-10 DIAGNOSIS — D638 Anemia in other chronic diseases classified elsewhere: Secondary | ICD-10-CM | POA: Diagnosis not present

## 2019-07-10 DIAGNOSIS — I10 Essential (primary) hypertension: Secondary | ICD-10-CM | POA: Diagnosis not present

## 2019-07-10 DIAGNOSIS — M159 Polyosteoarthritis, unspecified: Secondary | ICD-10-CM | POA: Diagnosis not present

## 2019-07-10 DIAGNOSIS — F0281 Dementia in other diseases classified elsewhere with behavioral disturbance: Secondary | ICD-10-CM | POA: Diagnosis not present

## 2019-07-26 DIAGNOSIS — F329 Major depressive disorder, single episode, unspecified: Secondary | ICD-10-CM | POA: Diagnosis not present

## 2019-08-09 DIAGNOSIS — I10 Essential (primary) hypertension: Secondary | ICD-10-CM | POA: Diagnosis not present

## 2019-08-09 DIAGNOSIS — M159 Polyosteoarthritis, unspecified: Secondary | ICD-10-CM | POA: Diagnosis not present

## 2019-08-09 DIAGNOSIS — E559 Vitamin D deficiency, unspecified: Secondary | ICD-10-CM | POA: Diagnosis not present

## 2019-08-09 DIAGNOSIS — D638 Anemia in other chronic diseases classified elsewhere: Secondary | ICD-10-CM | POA: Diagnosis not present

## 2019-08-23 DIAGNOSIS — D518 Other vitamin B12 deficiency anemias: Secondary | ICD-10-CM | POA: Diagnosis not present

## 2019-08-23 DIAGNOSIS — Z79899 Other long term (current) drug therapy: Secondary | ICD-10-CM | POA: Diagnosis not present

## 2019-08-30 DIAGNOSIS — U071 COVID-19: Secondary | ICD-10-CM | POA: Diagnosis not present

## 2019-09-04 DIAGNOSIS — R293 Abnormal posture: Secondary | ICD-10-CM | POA: Diagnosis not present

## 2019-09-12 DIAGNOSIS — R05 Cough: Secondary | ICD-10-CM | POA: Diagnosis not present

## 2019-09-12 DIAGNOSIS — U071 COVID-19: Secondary | ICD-10-CM | POA: Diagnosis not present

## 2019-09-12 DIAGNOSIS — F331 Major depressive disorder, recurrent, moderate: Secondary | ICD-10-CM | POA: Diagnosis not present

## 2019-09-12 DIAGNOSIS — E559 Vitamin D deficiency, unspecified: Secondary | ICD-10-CM | POA: Diagnosis not present

## 2019-09-12 DIAGNOSIS — R32 Unspecified urinary incontinence: Secondary | ICD-10-CM | POA: Diagnosis not present

## 2019-09-12 DIAGNOSIS — G301 Alzheimer's disease with late onset: Secondary | ICD-10-CM | POA: Diagnosis not present

## 2019-09-12 DIAGNOSIS — M159 Polyosteoarthritis, unspecified: Secondary | ICD-10-CM | POA: Diagnosis not present

## 2019-09-12 DIAGNOSIS — D519 Vitamin B12 deficiency anemia, unspecified: Secondary | ICD-10-CM | POA: Diagnosis not present

## 2019-09-12 DIAGNOSIS — I10 Essential (primary) hypertension: Secondary | ICD-10-CM | POA: Diagnosis not present

## 2019-09-12 DIAGNOSIS — F0280 Dementia in other diseases classified elsewhere without behavioral disturbance: Secondary | ICD-10-CM | POA: Diagnosis not present

## 2019-09-13 DIAGNOSIS — U071 COVID-19: Secondary | ICD-10-CM | POA: Diagnosis not present

## 2019-09-13 DIAGNOSIS — F0281 Dementia in other diseases classified elsewhere with behavioral disturbance: Secondary | ICD-10-CM | POA: Diagnosis not present

## 2019-09-13 DIAGNOSIS — I10 Essential (primary) hypertension: Secondary | ICD-10-CM | POA: Diagnosis not present

## 2019-09-13 DIAGNOSIS — R0989 Other specified symptoms and signs involving the circulatory and respiratory systems: Secondary | ICD-10-CM | POA: Diagnosis not present

## 2019-09-19 DIAGNOSIS — U071 COVID-19: Secondary | ICD-10-CM | POA: Diagnosis not present

## 2019-09-19 DIAGNOSIS — E559 Vitamin D deficiency, unspecified: Secondary | ICD-10-CM | POA: Diagnosis not present

## 2019-09-19 DIAGNOSIS — F0280 Dementia in other diseases classified elsewhere without behavioral disturbance: Secondary | ICD-10-CM | POA: Diagnosis not present

## 2019-09-19 DIAGNOSIS — F331 Major depressive disorder, recurrent, moderate: Secondary | ICD-10-CM | POA: Diagnosis not present

## 2019-09-19 DIAGNOSIS — D519 Vitamin B12 deficiency anemia, unspecified: Secondary | ICD-10-CM | POA: Diagnosis not present

## 2019-09-19 DIAGNOSIS — R32 Unspecified urinary incontinence: Secondary | ICD-10-CM | POA: Diagnosis not present

## 2019-09-19 DIAGNOSIS — I10 Essential (primary) hypertension: Secondary | ICD-10-CM | POA: Diagnosis not present

## 2019-09-19 DIAGNOSIS — M159 Polyosteoarthritis, unspecified: Secondary | ICD-10-CM | POA: Diagnosis not present

## 2019-09-19 DIAGNOSIS — G301 Alzheimer's disease with late onset: Secondary | ICD-10-CM | POA: Diagnosis not present

## 2019-09-26 DIAGNOSIS — R32 Unspecified urinary incontinence: Secondary | ICD-10-CM | POA: Diagnosis not present

## 2019-09-26 DIAGNOSIS — D519 Vitamin B12 deficiency anemia, unspecified: Secondary | ICD-10-CM | POA: Diagnosis not present

## 2019-09-26 DIAGNOSIS — F0280 Dementia in other diseases classified elsewhere without behavioral disturbance: Secondary | ICD-10-CM | POA: Diagnosis not present

## 2019-09-26 DIAGNOSIS — G301 Alzheimer's disease with late onset: Secondary | ICD-10-CM | POA: Diagnosis not present

## 2019-09-26 DIAGNOSIS — I10 Essential (primary) hypertension: Secondary | ICD-10-CM | POA: Diagnosis not present

## 2019-09-26 DIAGNOSIS — F331 Major depressive disorder, recurrent, moderate: Secondary | ICD-10-CM | POA: Diagnosis not present

## 2019-09-26 DIAGNOSIS — E559 Vitamin D deficiency, unspecified: Secondary | ICD-10-CM | POA: Diagnosis not present

## 2019-09-26 DIAGNOSIS — U071 COVID-19: Secondary | ICD-10-CM | POA: Diagnosis not present

## 2019-09-26 DIAGNOSIS — M159 Polyosteoarthritis, unspecified: Secondary | ICD-10-CM | POA: Diagnosis not present

## 2019-09-27 DIAGNOSIS — G301 Alzheimer's disease with late onset: Secondary | ICD-10-CM | POA: Diagnosis not present

## 2019-09-27 DIAGNOSIS — F0280 Dementia in other diseases classified elsewhere without behavioral disturbance: Secondary | ICD-10-CM | POA: Diagnosis not present

## 2019-09-27 DIAGNOSIS — D519 Vitamin B12 deficiency anemia, unspecified: Secondary | ICD-10-CM | POA: Diagnosis not present

## 2019-09-27 DIAGNOSIS — U071 COVID-19: Secondary | ICD-10-CM | POA: Diagnosis not present

## 2019-09-28 DIAGNOSIS — F330 Major depressive disorder, recurrent, mild: Secondary | ICD-10-CM | POA: Diagnosis not present

## 2019-09-30 ENCOUNTER — Emergency Department
Admission: EM | Admit: 2019-09-30 | Discharge: 2019-10-01 | Disposition: A | Discharge status: Home / Self Care | Payer: PPO | Attending: Emergency Medicine | Admitting: Emergency Medicine

## 2019-09-30 ENCOUNTER — Encounter: Payer: Self-pay | Admitting: Emergency Medicine

## 2019-09-30 ENCOUNTER — Other Ambulatory Visit: Payer: Self-pay

## 2019-09-30 DIAGNOSIS — R4020 Unspecified coma: Secondary | ICD-10-CM | POA: Diagnosis not present

## 2019-09-30 DIAGNOSIS — F0281 Dementia in other diseases classified elsewhere with behavioral disturbance: Secondary | ICD-10-CM | POA: Insufficient documentation

## 2019-09-30 DIAGNOSIS — R0902 Hypoxemia: Secondary | ICD-10-CM | POA: Diagnosis not present

## 2019-09-30 DIAGNOSIS — Z87891 Personal history of nicotine dependence: Secondary | ICD-10-CM | POA: Insufficient documentation

## 2019-09-30 DIAGNOSIS — N390 Urinary tract infection, site not specified: Secondary | ICD-10-CM | POA: Insufficient documentation

## 2019-09-30 DIAGNOSIS — G301 Alzheimer's disease with late onset: Secondary | ICD-10-CM | POA: Insufficient documentation

## 2019-09-30 DIAGNOSIS — I10 Essential (primary) hypertension: Secondary | ICD-10-CM | POA: Diagnosis not present

## 2019-09-30 DIAGNOSIS — Z79899 Other long term (current) drug therapy: Secondary | ICD-10-CM | POA: Diagnosis not present

## 2019-09-30 DIAGNOSIS — I959 Hypotension, unspecified: Secondary | ICD-10-CM | POA: Diagnosis not present

## 2019-09-30 DIAGNOSIS — R4182 Altered mental status, unspecified: Secondary | ICD-10-CM | POA: Diagnosis present

## 2019-09-30 LAB — CBC WITH DIFFERENTIAL/PLATELET
Abs Immature Granulocytes: 0.03 10*3/uL (ref 0.00–0.07)
Basophils Absolute: 0.1 10*3/uL (ref 0.0–0.1)
Basophils Relative: 1 %
Eosinophils Absolute: 0.1 10*3/uL (ref 0.0–0.5)
Eosinophils Relative: 2 %
HCT: 41.5 % (ref 36.0–46.0)
Hemoglobin: 13.4 g/dL (ref 12.0–15.0)
Immature Granulocytes: 0 %
Lymphocytes Relative: 32 %
Lymphs Abs: 2.4 10*3/uL (ref 0.7–4.0)
MCH: 31.7 pg (ref 26.0–34.0)
MCHC: 32.3 g/dL (ref 30.0–36.0)
MCV: 98.1 fL (ref 80.0–100.0)
Monocytes Absolute: 0.8 10*3/uL (ref 0.1–1.0)
Monocytes Relative: 11 %
Neutro Abs: 4.1 10*3/uL (ref 1.7–7.7)
Neutrophils Relative %: 54 %
Platelets: 177 10*3/uL (ref 150–400)
RBC: 4.23 MIL/uL (ref 3.87–5.11)
RDW: 13.1 % (ref 11.5–15.5)
WBC: 7.4 10*3/uL (ref 4.0–10.5)
nRBC: 0 % (ref 0.0–0.2)

## 2019-09-30 LAB — BASIC METABOLIC PANEL
Anion gap: 9 (ref 5–15)
BUN: 24 mg/dL — ABNORMAL HIGH (ref 8–23)
CO2: 26 mmol/L (ref 22–32)
Calcium: 9.3 mg/dL (ref 8.9–10.3)
Chloride: 109 mmol/L (ref 98–111)
Creatinine, Ser: 0.46 mg/dL (ref 0.44–1.00)
GFR calc Af Amer: >60 mL/min (ref >60)
GFR calc non Af Amer: >60 mL/min (ref >60)
Glucose, Bld: 93 mg/dL (ref 70–99)
Potassium: 3.6 mmol/L (ref 3.5–5.1)
Sodium: 144 mmol/L (ref 135–145)

## 2019-09-30 NOTE — ED Triage Notes (Signed)
Patient brought in by ems from [facility] memory care. Per facility patient more agitated then normal. Ems vs  Blood pressure 122/53 and temperature 98.8.

## 2019-09-30 NOTE — ED Provider Notes (Signed)
[facility] Emergency Department Provider Note  ____________________________________________   I have reviewed the triage vital signs and the nursing notes.   HISTORY  Chief Complaint Altered Mental Status   History limited by and level 5 caveat due to: Dementia HPI [name] is a 85 y.o. female who presents to the emergency department today from living facility because of concern for difficulty waking up. Apparently staff were doing rounds this evening. Patient was in bed sleeping. When they tried to wake her up they were having a hard time. When she did wake up she was agitated. The patient herself cannot give any significant history. Vital signs without concerning abnormalities with EMS.   Records reviewed. Per medical record review patient has a history of dementia.   Past Medical History:  Diagnosis Date  . Age-related osteoporosis with current pathological fracture with routine healing 01/23/2018  . Alzheimer's dementia, late onset ([place])   . Cataract cortical, senile   . Depression   . Diverticulosis 11/22/2008  . Dizziness   . Heart murmur   . Hemorrhoids   . History of colonic polyps 11/22/2008  . Hx of migraine headaches   . Hypertension   . Late onset Alzheimer's disease with behavioral disturbance ([place]) 01/23/2018  . Osteoarthritis   . Pelvic fracture ([place])   . Psoriasis, unspecified   . RBBB   . Thrombocytopenia (HCC)    mild  . Vitamin D deficiency disease     Patient Active Problem List   Diagnosis Date Noted  . Status post hip surgery 03/21/2018  . Closed right hip fracture ([place]) 01/18/2018  . Altered mental status 06/07/2017  . Hyperglycemia 06/07/2017  . Dementia with behavioral disturbance ([place]) 06/07/2017  . Weakness 06/04/2017  . Late onset Alzheimer's disease without behavioral disturbance ([place]) 09/07/2016  . B12 deficiency 02/07/2016  . Major depressive disorder, recurrent, moderate ([place]) 02/05/2016  . Vitamin D  deficiency disease   . RBBB   . Thrombocytopenia ([place])   . Heart murmur   . Essential hypertension 09/05/2014    Past Surgical History:  Procedure Laterality Date  . ABDOMINAL HYSTERECTOMY    . APPENDECTOMY    . CATARACT EXTRACTION EXTRACAPSULAR  2007   OU  ([place])  . CATARACT EXTRACTION, BILATERAL    . CHOLECYSTECTOMY    . COLONOSCOPY W/ POLYPECTOMY     06/2002, 11/22/2008 Nicolasa Ducking / [facility])  . INTRAMEDULLARY (IM) NAIL INTERTROCHANTERIC Right 01/18/2018   Procedure: INTRAMEDULLARY (IM) NAIL INTERTROCHANTRIC (TFNA);  Surgeon: Lovell Sheehan, MD;  Location: [facility] ORS;  Service: Orthopedics;  Laterality: Right;  . MASTOIDECTOMY      Prior to Admission medications   Medication Sig Start Date End Date Taking? Authorizing Provider  acetaminophen (TYLENOL) 325 MG tablet Take 650 mg by mouth 4 (four) times daily.     [provider]  Amino Acids-Protein Hydrolys (FEEDING SUPPLEMENT, PRO-STAT SUGAR FREE 64,) LIQD Take 30 mLs by mouth 2 (two) times daily between meals. low protein, low albumin    [provider]  buPROPion (WELLBUTRIN XL) 300 MG 24 hr tablet Take 300 mg by mouth daily.    [provider]  divalproex (DEPAKOTE) 250 MG DR tablet Take 1 tablet (250 mg total) by mouth 2 (two) times daily. 06/07/17   Theodoro Grist, MD  donepezil (ARICEPT) 10 MG tablet Take 1 tablet (10 mg total) by mouth at bedtime. 11/29/16   Tat, Eustace Quail, DO  lactose free nutrition (BOOST) LIQD Take 237 mLs by mouth 3 (  three) times daily between meals.    [provider]  LORazepam (ATIVAN) 0.5 MG tablet Take 0.5 mg by mouth every 4 (four) hours as needed. for restlessness/agitation    [provider]  mirtazapine (REMERON) 7.5 MG tablet Take 7.5 mg by mouth at bedtime.     [provider]  QUEtiapine (SEROQUEL) 25 MG tablet Take 25 mg by mouth 2 (two) times daily.    [provider]  vitamin B-12 (CYANOCOBALAMIN) 500 MCG tablet Take 500 mcg by mouth daily.     [provider]  Vitamin D, Ergocalciferol, (DRISDOL) 50000 units CAPS capsule Take 50,000 Units by mouth every 7 (seven) days.    [provider]    Allergies Patient has no known allergies.  Family History  Problem Relation Age of Onset  . Arthritis Mother   . Mental illness Mother   . Stroke Mother   . Arthritis Father   . Atrial fibrillation Sister     Social History Social History   Tobacco Use  . Smoking status: Former Smoker    Packs/day: 0.50    Years: 15.00    Pack years: 7.50    Types: Cigarettes    Quit date: 12/14/2007    Years since quitting: 11.8  . Smokeless tobacco: Never Used  Substance Use Topics  . Alcohol use: No    Alcohol/week: 0.0 standard drinks  . Drug use: No    Review of Systems Unable to obtain reliable ROS secondary to dementia.   ____________________________________________   PHYSICAL EXAM:  VITAL SIGNS: ED Triage Vitals  Enc Vitals Group     BP --      Pulse Rate 09/30/19 2326 75     Resp 09/30/19 2326 18     Temp 09/30/19 2326 98.3 F (36.8 C)     Temp Source 09/30/19 2326 Oral     SpO2 09/30/19 2326 95 %     Weight 09/30/19 2322 120 lb (54.4 kg)     Height 09/30/19 2322 5\' 1"  (1.549 m)     Head Circumference --      Peak Flow --      Pain Score 09/30/19 2321 0    Constitutional: Awake and alert. Calm.  Eyes: Conjunctivae are normal.  ENT      Head: Normocephalic and atraumatic.      Nose: No congestion/rhinnorhea.      Mouth/Throat: Mucous membranes are moist.      Neck: No stridor. Hematological/Lymphatic/Immunilogical: No cervical lymphadenopathy. Cardiovascular: Normal rate, regular rhythm.  No murmurs, rubs, or gallops. Respiratory: Normal respiratory effort without tachypnea nor retractions. Breath sounds are clear and equal bilaterally. No wheezes/rales/rhonchi. Gastrointestinal: Soft and non tender. No rebound. No guarding.  Genitourinary: Deferred Musculoskeletal: Normal range of motion  in all extremities. No lower extremity edema. Neurologic: Dementia Skin:  Skin is warm, dry and intact. No rash noted. Psychiatric: Mood and affect are normal. Speech and behavior are normal. Patient exhibits appropriate insight and judgment.  ____________________________________________    LABS (pertinent positives/negatives)  CBC wbc 7.4, hgb 13.4, plt 177 BMP wnl except bun 24 UA hazy, positive nitrite, trace leukocytes, 11-20 wbc, many bacteria.  ____________________________________________   EKG  None  ____________________________________________    RADIOLOGY  None ____________________________________________   PROCEDURES  Procedures  ____________________________________________   INITIAL IMPRESSION / ASSESSMENT AND PLAN / ED COURSE  Pertinent labs & imaging results that were available during my care of the patient were reviewed by me and considered in my  medical decision making (see chart for details).   Patient presents via EMS from living facility because of concern for difficulty waking her from sleep and then some agitation once she was woken up. On exam here patient calm. Vital signs without concerning findings. Blood work without concerning abnormality. UA is concerning for UTI. Will plan on giving dose of antibiotics here and discharging with further antibiotics.    ____________________________________________   FINAL CLINICAL IMPRESSION(S) / ED DIAGNOSES  Final diagnoses:  Lower urinary tract infectious disease     Note: This dictation was prepared with Dragon dictation. Any transcriptional errors that result from this process are unintentional     Nance Pear, MD 10/01/19 [phone]

## 2019-10-01 DIAGNOSIS — R279 Unspecified lack of coordination: Secondary | ICD-10-CM | POA: Diagnosis not present

## 2019-10-01 DIAGNOSIS — R5381 Other malaise: Secondary | ICD-10-CM | POA: Diagnosis not present

## 2019-10-01 DIAGNOSIS — Z743 Need for continuous supervision: Secondary | ICD-10-CM | POA: Diagnosis not present

## 2019-10-01 LAB — URINALYSIS, COMPLETE (UACMP) WITH MICROSCOPIC
Bilirubin Urine: NEGATIVE
Glucose, UA: NEGATIVE mg/dL
Hgb urine dipstick: NEGATIVE
Ketones, ur: NEGATIVE mg/dL
Nitrite: POSITIVE — AB
Protein, ur: NEGATIVE mg/dL
Specific Gravity, Urine: 1.034 — ABNORMAL HIGH (ref 1.005–1.030)
pH: 5 (ref 5.0–8.0)

## 2019-10-01 MED ORDER — SODIUM CHLORIDE 0.9 % IV SOLN
1.0000 g | Freq: Once | INTRAVENOUS | Status: AC
  Administered 2019-10-01: 01:00:00 1 g via INTRAVENOUS
  Filled 2019-10-01: qty 10

## 2019-10-01 MED ORDER — CEPHALEXIN 500 MG PO CAPS: 500.0000 mg | ORAL_CAPSULE | Freq: Four times a day (QID) | ORAL | 0 refills | Status: AC

## 2019-10-01 NOTE — Discharge Instructions (Addendum)
Please seek medical attention for any high fevers, chest pain, shortness of breath, change in behavior, persistent vomiting, bloody stool or any other new or concerning symptoms.  

## 2019-10-02 DIAGNOSIS — I10 Essential (primary) hypertension: Secondary | ICD-10-CM | POA: Diagnosis not present

## 2019-10-02 DIAGNOSIS — M159 Polyosteoarthritis, unspecified: Secondary | ICD-10-CM | POA: Diagnosis not present

## 2019-10-02 DIAGNOSIS — R32 Unspecified urinary incontinence: Secondary | ICD-10-CM | POA: Diagnosis not present

## 2019-10-02 DIAGNOSIS — F331 Major depressive disorder, recurrent, moderate: Secondary | ICD-10-CM | POA: Diagnosis not present

## 2019-10-02 DIAGNOSIS — F0280 Dementia in other diseases classified elsewhere without behavioral disturbance: Secondary | ICD-10-CM | POA: Diagnosis not present

## 2019-10-02 DIAGNOSIS — D519 Vitamin B12 deficiency anemia, unspecified: Secondary | ICD-10-CM | POA: Diagnosis not present

## 2019-10-02 DIAGNOSIS — G301 Alzheimer's disease with late onset: Secondary | ICD-10-CM | POA: Diagnosis not present

## 2019-10-02 DIAGNOSIS — U071 COVID-19: Secondary | ICD-10-CM | POA: Diagnosis not present

## 2019-10-02 DIAGNOSIS — E559 Vitamin D deficiency, unspecified: Secondary | ICD-10-CM | POA: Diagnosis not present

## 2019-10-05 DIAGNOSIS — U071 COVID-19: Secondary | ICD-10-CM | POA: Diagnosis not present

## 2019-10-05 DIAGNOSIS — M159 Polyosteoarthritis, unspecified: Secondary | ICD-10-CM | POA: Diagnosis not present

## 2019-10-05 DIAGNOSIS — R32 Unspecified urinary incontinence: Secondary | ICD-10-CM | POA: Diagnosis not present

## 2019-10-05 DIAGNOSIS — E559 Vitamin D deficiency, unspecified: Secondary | ICD-10-CM | POA: Diagnosis not present

## 2019-10-05 DIAGNOSIS — D519 Vitamin B12 deficiency anemia, unspecified: Secondary | ICD-10-CM | POA: Diagnosis not present

## 2019-10-05 DIAGNOSIS — I10 Essential (primary) hypertension: Secondary | ICD-10-CM | POA: Diagnosis not present

## 2019-10-05 DIAGNOSIS — F331 Major depressive disorder, recurrent, moderate: Secondary | ICD-10-CM | POA: Diagnosis not present

## 2019-10-05 DIAGNOSIS — G301 Alzheimer's disease with late onset: Secondary | ICD-10-CM | POA: Diagnosis not present

## 2019-10-05 DIAGNOSIS — F0280 Dementia in other diseases classified elsewhere without behavioral disturbance: Secondary | ICD-10-CM | POA: Diagnosis not present

## 2019-10-09 DIAGNOSIS — E538 Deficiency of other specified B group vitamins: Secondary | ICD-10-CM | POA: Diagnosis not present

## 2019-10-09 DIAGNOSIS — M159 Polyosteoarthritis, unspecified: Secondary | ICD-10-CM | POA: Diagnosis not present

## 2019-10-09 DIAGNOSIS — G301 Alzheimer's disease with late onset: Secondary | ICD-10-CM | POA: Diagnosis not present

## 2019-10-09 DIAGNOSIS — I10 Essential (primary) hypertension: Secondary | ICD-10-CM | POA: Diagnosis not present

## 2019-10-15 DIAGNOSIS — U071 COVID-19: Secondary | ICD-10-CM | POA: Diagnosis not present

## 2019-10-15 DIAGNOSIS — D519 Vitamin B12 deficiency anemia, unspecified: Secondary | ICD-10-CM | POA: Diagnosis not present

## 2019-10-15 DIAGNOSIS — E559 Vitamin D deficiency, unspecified: Secondary | ICD-10-CM | POA: Diagnosis not present

## 2019-10-15 DIAGNOSIS — I10 Essential (primary) hypertension: Secondary | ICD-10-CM | POA: Diagnosis not present

## 2019-10-15 DIAGNOSIS — R32 Unspecified urinary incontinence: Secondary | ICD-10-CM | POA: Diagnosis not present

## 2019-10-15 DIAGNOSIS — F0280 Dementia in other diseases classified elsewhere without behavioral disturbance: Secondary | ICD-10-CM | POA: Diagnosis not present

## 2019-10-15 DIAGNOSIS — F331 Major depressive disorder, recurrent, moderate: Secondary | ICD-10-CM | POA: Diagnosis not present

## 2019-10-15 DIAGNOSIS — M159 Polyosteoarthritis, unspecified: Secondary | ICD-10-CM | POA: Diagnosis not present

## 2019-10-15 DIAGNOSIS — G301 Alzheimer's disease with late onset: Secondary | ICD-10-CM | POA: Diagnosis not present

## 2019-10-26 DIAGNOSIS — G301 Alzheimer's disease with late onset: Secondary | ICD-10-CM | POA: Diagnosis not present

## 2019-10-26 DIAGNOSIS — F330 Major depressive disorder, recurrent, mild: Secondary | ICD-10-CM | POA: Diagnosis not present

## 2019-11-06 DIAGNOSIS — R269 Unspecified abnormalities of gait and mobility: Secondary | ICD-10-CM | POA: Diagnosis not present

## 2019-11-06 DIAGNOSIS — I739 Peripheral vascular disease, unspecified: Secondary | ICD-10-CM | POA: Diagnosis not present

## 2019-11-06 DIAGNOSIS — I10 Essential (primary) hypertension: Secondary | ICD-10-CM | POA: Diagnosis not present

## 2019-11-06 DIAGNOSIS — E559 Vitamin D deficiency, unspecified: Secondary | ICD-10-CM | POA: Diagnosis not present

## 2019-11-07 DIAGNOSIS — R0602 Shortness of breath: Secondary | ICD-10-CM | POA: Diagnosis not present

## 2019-11-15 DIAGNOSIS — E559 Vitamin D deficiency, unspecified: Secondary | ICD-10-CM | POA: Diagnosis not present

## 2019-11-15 DIAGNOSIS — M159 Polyosteoarthritis, unspecified: Secondary | ICD-10-CM | POA: Diagnosis not present

## 2019-11-15 DIAGNOSIS — Z79899 Other long term (current) drug therapy: Secondary | ICD-10-CM | POA: Diagnosis not present

## 2019-11-15 DIAGNOSIS — G301 Alzheimer's disease with late onset: Secondary | ICD-10-CM | POA: Diagnosis not present

## 2019-11-15 DIAGNOSIS — E7849 Other hyperlipidemia: Secondary | ICD-10-CM | POA: Diagnosis not present

## 2019-11-15 DIAGNOSIS — F331 Major depressive disorder, recurrent, moderate: Secondary | ICD-10-CM | POA: Diagnosis not present

## 2019-11-15 DIAGNOSIS — I10 Essential (primary) hypertension: Secondary | ICD-10-CM | POA: Diagnosis not present

## 2019-11-15 DIAGNOSIS — E119 Type 2 diabetes mellitus without complications: Secondary | ICD-10-CM | POA: Diagnosis not present

## 2019-11-15 DIAGNOSIS — F0280 Dementia in other diseases classified elsewhere without behavioral disturbance: Secondary | ICD-10-CM | POA: Diagnosis not present

## 2019-11-15 DIAGNOSIS — R32 Unspecified urinary incontinence: Secondary | ICD-10-CM | POA: Diagnosis not present

## 2019-11-15 DIAGNOSIS — E038 Other specified hypothyroidism: Secondary | ICD-10-CM | POA: Diagnosis not present

## 2019-11-15 DIAGNOSIS — M6281 Muscle weakness (generalized): Secondary | ICD-10-CM | POA: Diagnosis not present

## 2019-11-15 DIAGNOSIS — D518 Other vitamin B12 deficiency anemias: Secondary | ICD-10-CM | POA: Diagnosis not present

## 2019-11-15 DIAGNOSIS — D519 Vitamin B12 deficiency anemia, unspecified: Secondary | ICD-10-CM | POA: Diagnosis not present

## 2019-11-22 DIAGNOSIS — F330 Major depressive disorder, recurrent, mild: Secondary | ICD-10-CM | POA: Diagnosis not present

## 2019-11-22 DIAGNOSIS — G301 Alzheimer's disease with late onset: Secondary | ICD-10-CM | POA: Diagnosis not present

## 2019-12-04 DIAGNOSIS — M159 Polyosteoarthritis, unspecified: Secondary | ICD-10-CM | POA: Diagnosis not present

## 2019-12-04 DIAGNOSIS — E538 Deficiency of other specified B group vitamins: Secondary | ICD-10-CM | POA: Diagnosis not present

## 2019-12-04 DIAGNOSIS — G301 Alzheimer's disease with late onset: Secondary | ICD-10-CM | POA: Diagnosis not present

## 2019-12-04 DIAGNOSIS — I10 Essential (primary) hypertension: Secondary | ICD-10-CM | POA: Diagnosis not present

## 2019-12-20 DIAGNOSIS — E559 Vitamin D deficiency, unspecified: Secondary | ICD-10-CM | POA: Diagnosis not present

## 2019-12-20 DIAGNOSIS — M159 Polyosteoarthritis, unspecified: Secondary | ICD-10-CM | POA: Diagnosis not present

## 2019-12-20 DIAGNOSIS — F0280 Dementia in other diseases classified elsewhere without behavioral disturbance: Secondary | ICD-10-CM | POA: Diagnosis not present

## 2019-12-20 DIAGNOSIS — G301 Alzheimer's disease with late onset: Secondary | ICD-10-CM | POA: Diagnosis not present

## 2019-12-20 DIAGNOSIS — I10 Essential (primary) hypertension: Secondary | ICD-10-CM | POA: Diagnosis not present

## 2019-12-20 DIAGNOSIS — F331 Major depressive disorder, recurrent, moderate: Secondary | ICD-10-CM | POA: Diagnosis not present

## 2019-12-20 DIAGNOSIS — D519 Vitamin B12 deficiency anemia, unspecified: Secondary | ICD-10-CM | POA: Diagnosis not present

## 2019-12-20 DIAGNOSIS — M6281 Muscle weakness (generalized): Secondary | ICD-10-CM | POA: Diagnosis not present

## 2019-12-20 DIAGNOSIS — R32 Unspecified urinary incontinence: Secondary | ICD-10-CM | POA: Diagnosis not present

## 2019-12-28 DIAGNOSIS — G301 Alzheimer's disease with late onset: Secondary | ICD-10-CM | POA: Diagnosis not present

## 2019-12-28 DIAGNOSIS — F330 Major depressive disorder, recurrent, mild: Secondary | ICD-10-CM | POA: Diagnosis not present

## 2020-01-01 DIAGNOSIS — I739 Peripheral vascular disease, unspecified: Secondary | ICD-10-CM | POA: Diagnosis not present

## 2020-01-01 DIAGNOSIS — I10 Essential (primary) hypertension: Secondary | ICD-10-CM | POA: Diagnosis not present

## 2020-01-01 DIAGNOSIS — G301 Alzheimer's disease with late onset: Secondary | ICD-10-CM | POA: Diagnosis not present

## 2020-01-01 DIAGNOSIS — E538 Deficiency of other specified B group vitamins: Secondary | ICD-10-CM | POA: Diagnosis not present

## 2020-01-15 DIAGNOSIS — D519 Vitamin B12 deficiency anemia, unspecified: Secondary | ICD-10-CM | POA: Diagnosis not present

## 2020-01-15 DIAGNOSIS — M159 Polyosteoarthritis, unspecified: Secondary | ICD-10-CM | POA: Diagnosis not present

## 2020-01-15 DIAGNOSIS — R1312 Dysphagia, oropharyngeal phase: Secondary | ICD-10-CM | POA: Diagnosis not present

## 2020-01-15 DIAGNOSIS — M6281 Muscle weakness (generalized): Secondary | ICD-10-CM | POA: Diagnosis not present

## 2020-01-15 DIAGNOSIS — Z8616 Personal history of COVID-19: Secondary | ICD-10-CM | POA: Diagnosis not present

## 2020-01-15 DIAGNOSIS — G301 Alzheimer's disease with late onset: Secondary | ICD-10-CM | POA: Diagnosis not present

## 2020-01-15 DIAGNOSIS — E559 Vitamin D deficiency, unspecified: Secondary | ICD-10-CM | POA: Diagnosis not present

## 2020-01-15 DIAGNOSIS — I10 Essential (primary) hypertension: Secondary | ICD-10-CM | POA: Diagnosis not present

## 2020-01-15 DIAGNOSIS — F331 Major depressive disorder, recurrent, moderate: Secondary | ICD-10-CM | POA: Diagnosis not present

## 2020-01-15 DIAGNOSIS — F0280 Dementia in other diseases classified elsewhere without behavioral disturbance: Secondary | ICD-10-CM | POA: Diagnosis not present

## 2020-01-29 DIAGNOSIS — E538 Deficiency of other specified B group vitamins: Secondary | ICD-10-CM | POA: Diagnosis not present

## 2020-01-29 DIAGNOSIS — G301 Alzheimer's disease with late onset: Secondary | ICD-10-CM | POA: Diagnosis not present

## 2020-01-29 DIAGNOSIS — F0281 Dementia in other diseases classified elsewhere with behavioral disturbance: Secondary | ICD-10-CM | POA: Diagnosis not present

## 2020-01-29 DIAGNOSIS — I10 Essential (primary) hypertension: Secondary | ICD-10-CM | POA: Diagnosis not present

## 2020-02-08 DIAGNOSIS — G301 Alzheimer's disease with late onset: Secondary | ICD-10-CM | POA: Diagnosis not present

## 2020-02-08 DIAGNOSIS — F0390 Unspecified dementia without behavioral disturbance: Secondary | ICD-10-CM | POA: Diagnosis not present

## 2020-02-14 DIAGNOSIS — Z79899 Other long term (current) drug therapy: Secondary | ICD-10-CM | POA: Diagnosis not present

## 2020-02-14 DIAGNOSIS — E119 Type 2 diabetes mellitus without complications: Secondary | ICD-10-CM | POA: Diagnosis not present

## 2020-02-14 DIAGNOSIS — E038 Other specified hypothyroidism: Secondary | ICD-10-CM | POA: Diagnosis not present

## 2020-02-14 DIAGNOSIS — E559 Vitamin D deficiency, unspecified: Secondary | ICD-10-CM | POA: Diagnosis not present

## 2020-02-14 DIAGNOSIS — E7849 Other hyperlipidemia: Secondary | ICD-10-CM | POA: Diagnosis not present

## 2020-02-14 DIAGNOSIS — D518 Other vitamin B12 deficiency anemias: Secondary | ICD-10-CM | POA: Diagnosis not present

## 2020-02-20 DIAGNOSIS — R1312 Dysphagia, oropharyngeal phase: Secondary | ICD-10-CM | POA: Diagnosis not present

## 2020-02-20 DIAGNOSIS — G301 Alzheimer's disease with late onset: Secondary | ICD-10-CM | POA: Diagnosis not present

## 2020-02-20 DIAGNOSIS — F0280 Dementia in other diseases classified elsewhere without behavioral disturbance: Secondary | ICD-10-CM | POA: Diagnosis not present

## 2020-02-20 DIAGNOSIS — D519 Vitamin B12 deficiency anemia, unspecified: Secondary | ICD-10-CM | POA: Diagnosis not present

## 2020-02-26 DIAGNOSIS — E538 Deficiency of other specified B group vitamins: Secondary | ICD-10-CM | POA: Diagnosis not present

## 2020-02-26 DIAGNOSIS — F0281 Dementia in other diseases classified elsewhere with behavioral disturbance: Secondary | ICD-10-CM | POA: Diagnosis not present

## 2020-02-26 DIAGNOSIS — G301 Alzheimer's disease with late onset: Secondary | ICD-10-CM | POA: Diagnosis not present

## 2020-02-26 DIAGNOSIS — I10 Essential (primary) hypertension: Secondary | ICD-10-CM | POA: Diagnosis not present

## 2020-03-19 DIAGNOSIS — S72141D Displaced intertrochanteric fracture of right femur, subsequent encounter for closed fracture with routine healing: Secondary | ICD-10-CM | POA: Diagnosis not present

## 2020-03-19 DIAGNOSIS — M6281 Muscle weakness (generalized): Secondary | ICD-10-CM | POA: Diagnosis not present

## 2020-03-19 DIAGNOSIS — R269 Unspecified abnormalities of gait and mobility: Secondary | ICD-10-CM | POA: Diagnosis not present

## 2020-03-19 DIAGNOSIS — F0281 Dementia in other diseases classified elsewhere with behavioral disturbance: Secondary | ICD-10-CM | POA: Diagnosis not present

## 2020-03-21 DIAGNOSIS — F330 Major depressive disorder, recurrent, mild: Secondary | ICD-10-CM | POA: Diagnosis not present

## 2020-03-21 DIAGNOSIS — G301 Alzheimer's disease with late onset: Secondary | ICD-10-CM | POA: Diagnosis not present

## 2020-03-21 DIAGNOSIS — F0390 Unspecified dementia without behavioral disturbance: Secondary | ICD-10-CM | POA: Diagnosis not present

## 2020-03-25 DIAGNOSIS — I10 Essential (primary) hypertension: Secondary | ICD-10-CM | POA: Diagnosis not present

## 2020-03-25 DIAGNOSIS — F0280 Dementia in other diseases classified elsewhere without behavioral disturbance: Secondary | ICD-10-CM | POA: Diagnosis not present

## 2020-03-25 DIAGNOSIS — G301 Alzheimer's disease with late onset: Secondary | ICD-10-CM | POA: Diagnosis not present

## 2020-03-25 DIAGNOSIS — E559 Vitamin D deficiency, unspecified: Secondary | ICD-10-CM | POA: Diagnosis not present

## 2020-03-31 DIAGNOSIS — B351 Tinea unguium: Secondary | ICD-10-CM | POA: Diagnosis not present

## 2020-04-18 DIAGNOSIS — S72141D Displaced intertrochanteric fracture of right femur, subsequent encounter for closed fracture with routine healing: Secondary | ICD-10-CM | POA: Diagnosis not present

## 2020-04-18 DIAGNOSIS — R269 Unspecified abnormalities of gait and mobility: Secondary | ICD-10-CM | POA: Diagnosis not present

## 2020-04-18 DIAGNOSIS — M6281 Muscle weakness (generalized): Secondary | ICD-10-CM | POA: Diagnosis not present

## 2020-04-18 DIAGNOSIS — F0281 Dementia in other diseases classified elsewhere with behavioral disturbance: Secondary | ICD-10-CM | POA: Diagnosis not present

## 2020-04-22 DIAGNOSIS — G309 Alzheimer's disease, unspecified: Secondary | ICD-10-CM | POA: Diagnosis not present

## 2020-04-22 DIAGNOSIS — F329 Major depressive disorder, single episode, unspecified: Secondary | ICD-10-CM | POA: Diagnosis not present

## 2020-04-22 DIAGNOSIS — F419 Anxiety disorder, unspecified: Secondary | ICD-10-CM | POA: Diagnosis not present

## 2020-04-22 DIAGNOSIS — I10 Essential (primary) hypertension: Secondary | ICD-10-CM | POA: Diagnosis not present

## 2020-04-24 DIAGNOSIS — M6281 Muscle weakness (generalized): Secondary | ICD-10-CM | POA: Diagnosis not present

## 2020-04-24 DIAGNOSIS — M1990 Unspecified osteoarthritis, unspecified site: Secondary | ICD-10-CM | POA: Diagnosis not present

## 2020-04-24 DIAGNOSIS — F0280 Dementia in other diseases classified elsewhere without behavioral disturbance: Secondary | ICD-10-CM | POA: Diagnosis not present

## 2020-04-24 DIAGNOSIS — F329 Major depressive disorder, single episode, unspecified: Secondary | ICD-10-CM | POA: Diagnosis not present

## 2020-04-24 DIAGNOSIS — R262 Difficulty in walking, not elsewhere classified: Secondary | ICD-10-CM | POA: Diagnosis not present

## 2020-04-24 DIAGNOSIS — I10 Essential (primary) hypertension: Secondary | ICD-10-CM | POA: Diagnosis not present

## 2020-04-24 DIAGNOSIS — G301 Alzheimer's disease with late onset: Secondary | ICD-10-CM | POA: Diagnosis not present

## 2020-04-24 DIAGNOSIS — D519 Vitamin B12 deficiency anemia, unspecified: Secondary | ICD-10-CM | POA: Diagnosis not present

## 2020-05-13 DIAGNOSIS — M6281 Muscle weakness (generalized): Secondary | ICD-10-CM | POA: Diagnosis not present

## 2020-05-13 DIAGNOSIS — R262 Difficulty in walking, not elsewhere classified: Secondary | ICD-10-CM | POA: Diagnosis not present

## 2020-05-13 DIAGNOSIS — I10 Essential (primary) hypertension: Secondary | ICD-10-CM | POA: Diagnosis not present

## 2020-05-13 DIAGNOSIS — F329 Major depressive disorder, single episode, unspecified: Secondary | ICD-10-CM | POA: Diagnosis not present

## 2020-05-13 DIAGNOSIS — D519 Vitamin B12 deficiency anemia, unspecified: Secondary | ICD-10-CM | POA: Diagnosis not present

## 2020-05-13 DIAGNOSIS — F0280 Dementia in other diseases classified elsewhere without behavioral disturbance: Secondary | ICD-10-CM | POA: Diagnosis not present

## 2020-05-13 DIAGNOSIS — M1990 Unspecified osteoarthritis, unspecified site: Secondary | ICD-10-CM | POA: Diagnosis not present

## 2020-05-13 DIAGNOSIS — G301 Alzheimer's disease with late onset: Secondary | ICD-10-CM | POA: Diagnosis not present

## 2020-05-16 DIAGNOSIS — F330 Major depressive disorder, recurrent, mild: Secondary | ICD-10-CM | POA: Diagnosis not present

## 2020-05-16 DIAGNOSIS — G301 Alzheimer's disease with late onset: Secondary | ICD-10-CM | POA: Diagnosis not present

## 2020-05-19 DIAGNOSIS — F0281 Dementia in other diseases classified elsewhere with behavioral disturbance: Secondary | ICD-10-CM | POA: Diagnosis not present

## 2020-05-19 DIAGNOSIS — S72141D Displaced intertrochanteric fracture of right femur, subsequent encounter for closed fracture with routine healing: Secondary | ICD-10-CM | POA: Diagnosis not present

## 2020-05-19 DIAGNOSIS — R269 Unspecified abnormalities of gait and mobility: Secondary | ICD-10-CM | POA: Diagnosis not present

## 2020-05-19 DIAGNOSIS — M6281 Muscle weakness (generalized): Secondary | ICD-10-CM | POA: Diagnosis not present

## 2020-05-20 DIAGNOSIS — E559 Vitamin D deficiency, unspecified: Secondary | ICD-10-CM | POA: Diagnosis not present

## 2020-05-20 DIAGNOSIS — G309 Alzheimer's disease, unspecified: Secondary | ICD-10-CM | POA: Diagnosis not present

## 2020-05-20 DIAGNOSIS — F0280 Dementia in other diseases classified elsewhere without behavioral disturbance: Secondary | ICD-10-CM | POA: Diagnosis not present

## 2020-05-20 DIAGNOSIS — I10 Essential (primary) hypertension: Secondary | ICD-10-CM | POA: Diagnosis not present

## 2020-05-22 DIAGNOSIS — E119 Type 2 diabetes mellitus without complications: Secondary | ICD-10-CM | POA: Diagnosis not present

## 2020-05-22 DIAGNOSIS — E559 Vitamin D deficiency, unspecified: Secondary | ICD-10-CM | POA: Diagnosis not present

## 2020-05-22 DIAGNOSIS — D518 Other vitamin B12 deficiency anemias: Secondary | ICD-10-CM | POA: Diagnosis not present

## 2020-05-22 DIAGNOSIS — E7849 Other hyperlipidemia: Secondary | ICD-10-CM | POA: Diagnosis not present

## 2020-05-22 DIAGNOSIS — E038 Other specified hypothyroidism: Secondary | ICD-10-CM | POA: Diagnosis not present

## 2020-05-22 DIAGNOSIS — Z79899 Other long term (current) drug therapy: Secondary | ICD-10-CM | POA: Diagnosis not present

## 2020-06-17 DIAGNOSIS — F419 Anxiety disorder, unspecified: Secondary | ICD-10-CM | POA: Diagnosis not present

## 2020-06-17 DIAGNOSIS — G309 Alzheimer's disease, unspecified: Secondary | ICD-10-CM | POA: Diagnosis not present

## 2020-06-17 DIAGNOSIS — F0280 Dementia in other diseases classified elsewhere without behavioral disturbance: Secondary | ICD-10-CM | POA: Diagnosis not present

## 2020-06-17 DIAGNOSIS — I10 Essential (primary) hypertension: Secondary | ICD-10-CM | POA: Diagnosis not present

## 2020-06-18 DIAGNOSIS — F0281 Dementia in other diseases classified elsewhere with behavioral disturbance: Secondary | ICD-10-CM | POA: Diagnosis not present

## 2020-06-18 DIAGNOSIS — R269 Unspecified abnormalities of gait and mobility: Secondary | ICD-10-CM | POA: Diagnosis not present

## 2020-06-18 DIAGNOSIS — S72141D Displaced intertrochanteric fracture of right femur, subsequent encounter for closed fracture with routine healing: Secondary | ICD-10-CM | POA: Diagnosis not present

## 2020-06-18 DIAGNOSIS — M6281 Muscle weakness (generalized): Secondary | ICD-10-CM | POA: Diagnosis not present

## 2020-07-15 DIAGNOSIS — G8929 Other chronic pain: Secondary | ICD-10-CM | POA: Diagnosis not present

## 2020-07-15 DIAGNOSIS — R1084 Generalized abdominal pain: Secondary | ICD-10-CM | POA: Diagnosis not present

## 2020-07-15 DIAGNOSIS — M545 Low back pain: Secondary | ICD-10-CM | POA: Diagnosis not present

## 2020-07-15 DIAGNOSIS — M1990 Unspecified osteoarthritis, unspecified site: Secondary | ICD-10-CM | POA: Diagnosis not present

## 2020-07-15 DIAGNOSIS — G309 Alzheimer's disease, unspecified: Secondary | ICD-10-CM | POA: Diagnosis not present

## 2020-07-16 DIAGNOSIS — R109 Unspecified abdominal pain: Secondary | ICD-10-CM | POA: Diagnosis not present

## 2020-07-18 DIAGNOSIS — B351 Tinea unguium: Secondary | ICD-10-CM | POA: Diagnosis not present

## 2020-07-19 DIAGNOSIS — R269 Unspecified abnormalities of gait and mobility: Secondary | ICD-10-CM | POA: Diagnosis not present

## 2020-07-19 DIAGNOSIS — S72141D Displaced intertrochanteric fracture of right femur, subsequent encounter for closed fracture with routine healing: Secondary | ICD-10-CM | POA: Diagnosis not present

## 2020-07-19 DIAGNOSIS — M6281 Muscle weakness (generalized): Secondary | ICD-10-CM | POA: Diagnosis not present

## 2020-07-19 DIAGNOSIS — F0281 Dementia in other diseases classified elsewhere with behavioral disturbance: Secondary | ICD-10-CM | POA: Diagnosis not present

## 2020-08-12 DIAGNOSIS — G8929 Other chronic pain: Secondary | ICD-10-CM | POA: Diagnosis not present

## 2020-08-12 DIAGNOSIS — G301 Alzheimer's disease with late onset: Secondary | ICD-10-CM | POA: Diagnosis not present

## 2020-08-12 DIAGNOSIS — K5900 Constipation, unspecified: Secondary | ICD-10-CM | POA: Diagnosis not present

## 2020-08-12 DIAGNOSIS — R1084 Generalized abdominal pain: Secondary | ICD-10-CM | POA: Diagnosis not present

## 2020-08-19 DIAGNOSIS — M6281 Muscle weakness (generalized): Secondary | ICD-10-CM | POA: Diagnosis not present

## 2020-08-19 DIAGNOSIS — R269 Unspecified abnormalities of gait and mobility: Secondary | ICD-10-CM | POA: Diagnosis not present

## 2020-08-19 DIAGNOSIS — F0281 Dementia in other diseases classified elsewhere with behavioral disturbance: Secondary | ICD-10-CM | POA: Diagnosis not present

## 2020-08-19 DIAGNOSIS — S72141D Displaced intertrochanteric fracture of right femur, subsequent encounter for closed fracture with routine healing: Secondary | ICD-10-CM | POA: Diagnosis not present

## 2020-08-22 DIAGNOSIS — F330 Major depressive disorder, recurrent, mild: Secondary | ICD-10-CM | POA: Diagnosis not present

## 2020-08-22 DIAGNOSIS — G301 Alzheimer's disease with late onset: Secondary | ICD-10-CM | POA: Diagnosis not present

## 2020-08-22 DIAGNOSIS — F419 Anxiety disorder, unspecified: Secondary | ICD-10-CM | POA: Diagnosis not present

## 2020-08-28 DIAGNOSIS — E7849 Other hyperlipidemia: Secondary | ICD-10-CM | POA: Diagnosis not present

## 2020-08-28 DIAGNOSIS — D518 Other vitamin B12 deficiency anemias: Secondary | ICD-10-CM | POA: Diagnosis not present

## 2020-08-28 DIAGNOSIS — Z79899 Other long term (current) drug therapy: Secondary | ICD-10-CM | POA: Diagnosis not present

## 2020-08-28 DIAGNOSIS — E119 Type 2 diabetes mellitus without complications: Secondary | ICD-10-CM | POA: Diagnosis not present

## 2020-08-28 DIAGNOSIS — E038 Other specified hypothyroidism: Secondary | ICD-10-CM | POA: Diagnosis not present

## 2020-08-28 DIAGNOSIS — E559 Vitamin D deficiency, unspecified: Secondary | ICD-10-CM | POA: Diagnosis not present

## 2020-09-04 DIAGNOSIS — E7849 Other hyperlipidemia: Secondary | ICD-10-CM | POA: Diagnosis not present

## 2020-09-04 DIAGNOSIS — Z79899 Other long term (current) drug therapy: Secondary | ICD-10-CM | POA: Diagnosis not present

## 2020-09-04 DIAGNOSIS — D518 Other vitamin B12 deficiency anemias: Secondary | ICD-10-CM | POA: Diagnosis not present

## 2020-09-04 DIAGNOSIS — E119 Type 2 diabetes mellitus without complications: Secondary | ICD-10-CM | POA: Diagnosis not present

## 2020-09-09 DIAGNOSIS — K5900 Constipation, unspecified: Secondary | ICD-10-CM | POA: Diagnosis not present

## 2020-09-09 DIAGNOSIS — G301 Alzheimer's disease with late onset: Secondary | ICD-10-CM | POA: Diagnosis not present

## 2020-09-09 DIAGNOSIS — M545 Low back pain: Secondary | ICD-10-CM | POA: Diagnosis not present

## 2020-09-09 DIAGNOSIS — E559 Vitamin D deficiency, unspecified: Secondary | ICD-10-CM | POA: Diagnosis not present

## 2020-09-18 DIAGNOSIS — F0281 Dementia in other diseases classified elsewhere with behavioral disturbance: Secondary | ICD-10-CM | POA: Diagnosis not present

## 2020-09-18 DIAGNOSIS — R269 Unspecified abnormalities of gait and mobility: Secondary | ICD-10-CM | POA: Diagnosis not present

## 2020-09-18 DIAGNOSIS — M6281 Muscle weakness (generalized): Secondary | ICD-10-CM | POA: Diagnosis not present

## 2020-09-18 DIAGNOSIS — S72141D Displaced intertrochanteric fracture of right femur, subsequent encounter for closed fracture with routine healing: Secondary | ICD-10-CM | POA: Diagnosis not present

## 2020-09-19 DIAGNOSIS — G301 Alzheimer's disease with late onset: Secondary | ICD-10-CM | POA: Diagnosis not present

## 2020-09-19 DIAGNOSIS — F330 Major depressive disorder, recurrent, mild: Secondary | ICD-10-CM | POA: Diagnosis not present

## 2020-09-19 DIAGNOSIS — F419 Anxiety disorder, unspecified: Secondary | ICD-10-CM | POA: Diagnosis not present

## 2020-10-07 DIAGNOSIS — K5900 Constipation, unspecified: Secondary | ICD-10-CM | POA: Diagnosis not present

## 2020-10-07 DIAGNOSIS — G309 Alzheimer's disease, unspecified: Secondary | ICD-10-CM | POA: Diagnosis not present

## 2020-10-07 DIAGNOSIS — F0280 Dementia in other diseases classified elsewhere without behavioral disturbance: Secondary | ICD-10-CM | POA: Diagnosis not present

## 2020-10-07 DIAGNOSIS — G8929 Other chronic pain: Secondary | ICD-10-CM | POA: Diagnosis not present

## 2020-10-07 DIAGNOSIS — G301 Alzheimer's disease with late onset: Secondary | ICD-10-CM | POA: Diagnosis not present

## 2020-10-15 DIAGNOSIS — B351 Tinea unguium: Secondary | ICD-10-CM | POA: Diagnosis not present

## 2020-10-15 DIAGNOSIS — R059 Cough, unspecified: Secondary | ICD-10-CM | POA: Diagnosis not present

## 2020-10-15 DIAGNOSIS — I739 Peripheral vascular disease, unspecified: Secondary | ICD-10-CM | POA: Diagnosis not present

## 2020-10-17 DIAGNOSIS — G301 Alzheimer's disease with late onset: Secondary | ICD-10-CM | POA: Diagnosis not present

## 2020-10-17 DIAGNOSIS — F330 Major depressive disorder, recurrent, mild: Secondary | ICD-10-CM | POA: Diagnosis not present

## 2020-10-17 DIAGNOSIS — F419 Anxiety disorder, unspecified: Secondary | ICD-10-CM | POA: Diagnosis not present

## 2020-10-19 DIAGNOSIS — F0281 Dementia in other diseases classified elsewhere with behavioral disturbance: Secondary | ICD-10-CM | POA: Diagnosis not present

## 2020-10-19 DIAGNOSIS — S72141D Displaced intertrochanteric fracture of right femur, subsequent encounter for closed fracture with routine healing: Secondary | ICD-10-CM | POA: Diagnosis not present

## 2020-10-19 DIAGNOSIS — R269 Unspecified abnormalities of gait and mobility: Secondary | ICD-10-CM | POA: Diagnosis not present

## 2020-10-19 DIAGNOSIS — M6281 Muscle weakness (generalized): Secondary | ICD-10-CM | POA: Diagnosis not present

## 2020-11-18 DIAGNOSIS — G8929 Other chronic pain: Secondary | ICD-10-CM | POA: Diagnosis not present

## 2020-11-18 DIAGNOSIS — F0281 Dementia in other diseases classified elsewhere with behavioral disturbance: Secondary | ICD-10-CM | POA: Diagnosis not present

## 2020-11-18 DIAGNOSIS — M6281 Muscle weakness (generalized): Secondary | ICD-10-CM | POA: Diagnosis not present

## 2020-11-18 DIAGNOSIS — R269 Unspecified abnormalities of gait and mobility: Secondary | ICD-10-CM | POA: Diagnosis not present

## 2020-11-18 DIAGNOSIS — K5900 Constipation, unspecified: Secondary | ICD-10-CM | POA: Diagnosis not present

## 2020-11-18 DIAGNOSIS — G301 Alzheimer's disease with late onset: Secondary | ICD-10-CM | POA: Diagnosis not present

## 2020-11-18 DIAGNOSIS — S72141D Displaced intertrochanteric fracture of right femur, subsequent encounter for closed fracture with routine healing: Secondary | ICD-10-CM | POA: Diagnosis not present

## 2020-11-18 DIAGNOSIS — E559 Vitamin D deficiency, unspecified: Secondary | ICD-10-CM | POA: Diagnosis not present

## 2020-11-20 DIAGNOSIS — E119 Type 2 diabetes mellitus without complications: Secondary | ICD-10-CM | POA: Diagnosis not present

## 2020-11-20 DIAGNOSIS — Z79899 Other long term (current) drug therapy: Secondary | ICD-10-CM | POA: Diagnosis not present

## 2020-11-20 DIAGNOSIS — D518 Other vitamin B12 deficiency anemias: Secondary | ICD-10-CM | POA: Diagnosis not present

## 2020-11-20 DIAGNOSIS — E559 Vitamin D deficiency, unspecified: Secondary | ICD-10-CM | POA: Diagnosis not present

## 2020-11-20 DIAGNOSIS — E7849 Other hyperlipidemia: Secondary | ICD-10-CM | POA: Diagnosis not present

## 2020-11-20 DIAGNOSIS — E038 Other specified hypothyroidism: Secondary | ICD-10-CM | POA: Diagnosis not present

## 2020-11-25 DIAGNOSIS — M1990 Unspecified osteoarthritis, unspecified site: Secondary | ICD-10-CM | POA: Diagnosis not present

## 2020-11-25 DIAGNOSIS — F32A Depression, unspecified: Secondary | ICD-10-CM | POA: Diagnosis not present

## 2020-11-25 DIAGNOSIS — G8929 Other chronic pain: Secondary | ICD-10-CM | POA: Diagnosis not present

## 2020-11-25 DIAGNOSIS — I10 Essential (primary) hypertension: Secondary | ICD-10-CM | POA: Diagnosis not present

## 2020-11-25 DIAGNOSIS — F419 Anxiety disorder, unspecified: Secondary | ICD-10-CM | POA: Diagnosis not present

## 2020-11-25 DIAGNOSIS — I6523 Occlusion and stenosis of bilateral carotid arteries: Secondary | ICD-10-CM | POA: Diagnosis not present

## 2020-11-25 DIAGNOSIS — E870 Hyperosmolality and hypernatremia: Secondary | ICD-10-CM | POA: Diagnosis not present

## 2020-11-25 DIAGNOSIS — K5900 Constipation, unspecified: Secondary | ICD-10-CM | POA: Diagnosis not present

## 2020-11-25 DIAGNOSIS — E559 Vitamin D deficiency, unspecified: Secondary | ICD-10-CM | POA: Diagnosis not present

## 2020-11-25 DIAGNOSIS — R0989 Other specified symptoms and signs involving the circulatory and respiratory systems: Secondary | ICD-10-CM | POA: Diagnosis not present

## 2020-11-25 DIAGNOSIS — F0280 Dementia in other diseases classified elsewhere without behavioral disturbance: Secondary | ICD-10-CM | POA: Diagnosis not present

## 2020-11-25 DIAGNOSIS — G301 Alzheimer's disease with late onset: Secondary | ICD-10-CM | POA: Diagnosis not present

## 2020-12-01 DIAGNOSIS — R011 Cardiac murmur, unspecified: Secondary | ICD-10-CM | POA: Diagnosis not present

## 2020-12-04 DIAGNOSIS — R221 Localized swelling, mass and lump, neck: Secondary | ICD-10-CM | POA: Diagnosis not present

## 2020-12-04 DIAGNOSIS — E042 Nontoxic multinodular goiter: Secondary | ICD-10-CM | POA: Diagnosis not present

## 2020-12-04 DIAGNOSIS — I700 Atherosclerosis of aorta: Secondary | ICD-10-CM | POA: Diagnosis not present

## 2020-12-04 DIAGNOSIS — I714 Abdominal aortic aneurysm, without rupture: Secondary | ICD-10-CM | POA: Diagnosis not present

## 2020-12-05 DIAGNOSIS — R590 Localized enlarged lymph nodes: Secondary | ICD-10-CM | POA: Diagnosis not present

## 2020-12-05 DIAGNOSIS — D492 Neoplasm of unspecified behavior of bone, soft tissue, and skin: Secondary | ICD-10-CM | POA: Diagnosis not present

## 2020-12-05 DIAGNOSIS — R229 Localized swelling, mass and lump, unspecified: Secondary | ICD-10-CM | POA: Diagnosis not present

## 2020-12-08 DIAGNOSIS — I739 Peripheral vascular disease, unspecified: Secondary | ICD-10-CM | POA: Diagnosis not present

## 2020-12-08 DIAGNOSIS — I70229 Atherosclerosis of native arteries of extremities with rest pain, unspecified extremity: Secondary | ICD-10-CM | POA: Diagnosis not present

## 2020-12-18 DIAGNOSIS — F0280 Dementia in other diseases classified elsewhere without behavioral disturbance: Secondary | ICD-10-CM | POA: Diagnosis not present

## 2020-12-18 DIAGNOSIS — D518 Other vitamin B12 deficiency anemias: Secondary | ICD-10-CM | POA: Diagnosis not present

## 2020-12-18 DIAGNOSIS — E119 Type 2 diabetes mellitus without complications: Secondary | ICD-10-CM | POA: Diagnosis not present

## 2020-12-18 DIAGNOSIS — F419 Anxiety disorder, unspecified: Secondary | ICD-10-CM | POA: Diagnosis not present

## 2020-12-18 DIAGNOSIS — Z79899 Other long term (current) drug therapy: Secondary | ICD-10-CM | POA: Diagnosis not present

## 2020-12-18 DIAGNOSIS — E7849 Other hyperlipidemia: Secondary | ICD-10-CM | POA: Diagnosis not present

## 2020-12-18 DIAGNOSIS — G309 Alzheimer's disease, unspecified: Secondary | ICD-10-CM | POA: Diagnosis not present

## 2020-12-18 DIAGNOSIS — I10 Essential (primary) hypertension: Secondary | ICD-10-CM | POA: Diagnosis not present

## 2020-12-18 DIAGNOSIS — K5900 Constipation, unspecified: Secondary | ICD-10-CM | POA: Diagnosis not present

## 2020-12-18 DIAGNOSIS — G301 Alzheimer's disease with late onset: Secondary | ICD-10-CM | POA: Diagnosis not present

## 2020-12-18 DIAGNOSIS — G8929 Other chronic pain: Secondary | ICD-10-CM | POA: Diagnosis not present

## 2020-12-19 DIAGNOSIS — R269 Unspecified abnormalities of gait and mobility: Secondary | ICD-10-CM | POA: Diagnosis not present

## 2020-12-19 DIAGNOSIS — S72141D Displaced intertrochanteric fracture of right femur, subsequent encounter for closed fracture with routine healing: Secondary | ICD-10-CM | POA: Diagnosis not present

## 2020-12-19 DIAGNOSIS — F0281 Dementia in other diseases classified elsewhere with behavioral disturbance: Secondary | ICD-10-CM | POA: Diagnosis not present

## 2020-12-19 DIAGNOSIS — M6281 Muscle weakness (generalized): Secondary | ICD-10-CM | POA: Diagnosis not present

## 2021-01-05 DIAGNOSIS — Z20828 Contact with and (suspected) exposure to other viral communicable diseases: Secondary | ICD-10-CM | POA: Diagnosis not present

## 2021-01-05 DIAGNOSIS — E7849 Other hyperlipidemia: Secondary | ICD-10-CM | POA: Diagnosis not present

## 2021-01-05 DIAGNOSIS — D518 Other vitamin B12 deficiency anemias: Secondary | ICD-10-CM | POA: Diagnosis not present

## 2021-01-05 DIAGNOSIS — B974 Respiratory syncytial virus as the cause of diseases classified elsewhere: Secondary | ICD-10-CM | POA: Diagnosis not present

## 2021-01-05 DIAGNOSIS — U071 COVID-19: Secondary | ICD-10-CM | POA: Diagnosis not present

## 2021-01-05 DIAGNOSIS — J101 Influenza due to other identified influenza virus with other respiratory manifestations: Secondary | ICD-10-CM | POA: Diagnosis not present

## 2021-01-05 DIAGNOSIS — Z79899 Other long term (current) drug therapy: Secondary | ICD-10-CM | POA: Diagnosis not present

## 2021-01-05 DIAGNOSIS — R0902 Hypoxemia: Secondary | ICD-10-CM | POA: Diagnosis not present

## 2021-01-05 DIAGNOSIS — E119 Type 2 diabetes mellitus without complications: Secondary | ICD-10-CM | POA: Diagnosis not present

## 2021-01-07 ENCOUNTER — Other Ambulatory Visit: Payer: Self-pay

## 2021-01-07 ENCOUNTER — Inpatient Hospital Stay
Admission: EM | Admit: 2021-01-07 | Discharge: 2021-01-12 | DRG: 871 | Disposition: A | Discharge status: Skilled Nursing Facility | Payer: PPO | Source: Skilled Nursing Facility | Attending: Internal Medicine | Admitting: Internal Medicine

## 2021-01-07 ENCOUNTER — Emergency Department: Payer: PPO

## 2021-01-07 DIAGNOSIS — Z79899 Other long term (current) drug therapy: Secondary | ICD-10-CM | POA: Diagnosis not present

## 2021-01-07 DIAGNOSIS — Z818 Family history of other mental and behavioral disorders: Secondary | ICD-10-CM | POA: Diagnosis not present

## 2021-01-07 DIAGNOSIS — B9620 Unspecified Escherichia coli [E. coli] as the cause of diseases classified elsewhere: Secondary | ICD-10-CM | POA: Diagnosis present

## 2021-01-07 DIAGNOSIS — Z66 Do not resuscitate: Secondary | ICD-10-CM | POA: Diagnosis not present

## 2021-01-07 DIAGNOSIS — R4020 Unspecified coma: Secondary | ICD-10-CM | POA: Diagnosis not present

## 2021-01-07 DIAGNOSIS — Z20822 Contact with and (suspected) exposure to covid-19: Secondary | ICD-10-CM | POA: Diagnosis present

## 2021-01-07 DIAGNOSIS — Z86010 Personal history of colonic polyps: Secondary | ICD-10-CM

## 2021-01-07 DIAGNOSIS — F03918 Unspecified dementia, unspecified severity, with other behavioral disturbance: Secondary | ICD-10-CM | POA: Diagnosis present

## 2021-01-07 DIAGNOSIS — E870 Hyperosmolality and hypernatremia: Secondary | ICD-10-CM | POA: Diagnosis present

## 2021-01-07 DIAGNOSIS — G301 Alzheimer's disease with late onset: Secondary | ICD-10-CM | POA: Diagnosis not present

## 2021-01-07 DIAGNOSIS — I10 Essential (primary) hypertension: Secondary | ICD-10-CM | POA: Diagnosis present

## 2021-01-07 DIAGNOSIS — Z7189 Other specified counseling: Secondary | ICD-10-CM | POA: Diagnosis not present

## 2021-01-07 DIAGNOSIS — R0902 Hypoxemia: Secondary | ICD-10-CM | POA: Diagnosis not present

## 2021-01-07 DIAGNOSIS — R7989 Other specified abnormal findings of blood chemistry: Secondary | ICD-10-CM | POA: Diagnosis present

## 2021-01-07 DIAGNOSIS — A419 Sepsis, unspecified organism: Secondary | ICD-10-CM | POA: Diagnosis not present

## 2021-01-07 DIAGNOSIS — Z90710 Acquired absence of both cervix and uterus: Secondary | ICD-10-CM

## 2021-01-07 DIAGNOSIS — F0391 Unspecified dementia with behavioral disturbance: Secondary | ICD-10-CM | POA: Diagnosis present

## 2021-01-07 DIAGNOSIS — R945 Abnormal results of liver function studies: Secondary | ICD-10-CM

## 2021-01-07 DIAGNOSIS — Z993 Dependence on wheelchair: Secondary | ICD-10-CM

## 2021-01-07 DIAGNOSIS — F0281 Dementia in other diseases classified elsewhere with behavioral disturbance: Secondary | ICD-10-CM | POA: Diagnosis present

## 2021-01-07 DIAGNOSIS — E559 Vitamin D deficiency, unspecified: Secondary | ICD-10-CM | POA: Diagnosis not present

## 2021-01-07 DIAGNOSIS — E860 Dehydration: Secondary | ICD-10-CM | POA: Diagnosis not present

## 2021-01-07 DIAGNOSIS — J9811 Atelectasis: Secondary | ICD-10-CM | POA: Diagnosis not present

## 2021-01-07 DIAGNOSIS — Z87891 Personal history of nicotine dependence: Secondary | ICD-10-CM

## 2021-01-07 DIAGNOSIS — R0689 Other abnormalities of breathing: Secondary | ICD-10-CM | POA: Diagnosis not present

## 2021-01-07 DIAGNOSIS — G9341 Metabolic encephalopathy: Secondary | ICD-10-CM | POA: Diagnosis present

## 2021-01-07 DIAGNOSIS — F419 Anxiety disorder, unspecified: Secondary | ICD-10-CM | POA: Diagnosis present

## 2021-01-07 DIAGNOSIS — R6520 Severe sepsis without septic shock: Secondary | ICD-10-CM | POA: Diagnosis present

## 2021-01-07 DIAGNOSIS — Z515 Encounter for palliative care: Secondary | ICD-10-CM

## 2021-01-07 DIAGNOSIS — R197 Diarrhea, unspecified: Secondary | ICD-10-CM | POA: Diagnosis not present

## 2021-01-07 DIAGNOSIS — Z9049 Acquired absence of other specified parts of digestive tract: Secondary | ICD-10-CM

## 2021-01-07 DIAGNOSIS — F32A Depression, unspecified: Secondary | ICD-10-CM | POA: Diagnosis not present

## 2021-01-07 DIAGNOSIS — I248 Other forms of acute ischemic heart disease: Secondary | ICD-10-CM | POA: Diagnosis present

## 2021-01-07 DIAGNOSIS — F418 Other specified anxiety disorders: Secondary | ICD-10-CM | POA: Diagnosis not present

## 2021-01-07 DIAGNOSIS — N390 Urinary tract infection, site not specified: Secondary | ICD-10-CM | POA: Diagnosis not present

## 2021-01-07 DIAGNOSIS — N3000 Acute cystitis without hematuria: Secondary | ICD-10-CM | POA: Diagnosis not present

## 2021-01-07 DIAGNOSIS — R404 Transient alteration of awareness: Secondary | ICD-10-CM | POA: Diagnosis not present

## 2021-01-07 DIAGNOSIS — R4182 Altered mental status, unspecified: Secondary | ICD-10-CM | POA: Diagnosis not present

## 2021-01-07 DIAGNOSIS — R778 Other specified abnormalities of plasma proteins: Secondary | ICD-10-CM | POA: Diagnosis not present

## 2021-01-07 LAB — URINALYSIS, COMPLETE (UACMP) WITH MICROSCOPIC
Bilirubin Urine: NEGATIVE
Glucose, UA: NEGATIVE mg/dL
Hgb urine dipstick: NEGATIVE
Ketones, ur: NEGATIVE mg/dL
Leukocytes,Ua: NEGATIVE
Nitrite: NEGATIVE
Protein, ur: 100 mg/dL — AB
Specific Gravity, Urine: 1.025 (ref 1.005–1.030)
pH: 5 (ref 5.0–8.0)

## 2021-01-07 LAB — CBC WITH DIFFERENTIAL/PLATELET
Abs Immature Granulocytes: 0.09 10*3/uL — ABNORMAL HIGH (ref 0.00–0.07)
Basophils Absolute: 0.1 10*3/uL (ref 0.0–0.1)
Basophils Relative: 0 %
Eosinophils Absolute: 0 10*3/uL (ref 0.0–0.5)
Eosinophils Relative: 0 %
HCT: 62.2 % — ABNORMAL HIGH (ref 36.0–46.0)
Hemoglobin: 19 g/dL — ABNORMAL HIGH (ref 12.0–15.0)
Immature Granulocytes: 1 %
Lymphocytes Relative: 18 %
Lymphs Abs: 3 10*3/uL (ref 0.7–4.0)
MCH: 32.2 pg (ref 26.0–34.0)
MCHC: 30.5 g/dL (ref 30.0–36.0)
MCV: 105.4 fL — ABNORMAL HIGH (ref 80.0–100.0)
Monocytes Absolute: 1.3 10*3/uL — ABNORMAL HIGH (ref 0.1–1.0)
Monocytes Relative: 8 %
Neutro Abs: 11.9 10*3/uL — ABNORMAL HIGH (ref 1.7–7.7)
Neutrophils Relative %: 73 %
Platelets: 184 10*3/uL (ref 150–400)
RBC: 5.9 MIL/uL — ABNORMAL HIGH (ref 3.87–5.11)
RDW: 13.1 % (ref 11.5–15.5)
WBC: 16.4 10*3/uL — ABNORMAL HIGH (ref 4.0–10.5)
nRBC: 0 % (ref 0.0–0.2)

## 2021-01-07 LAB — COMPREHENSIVE METABOLIC PANEL
ALT: 119 U/L — ABNORMAL HIGH (ref 0–44)
AST: 162 U/L — ABNORMAL HIGH (ref 15–41)
Albumin: 2.8 g/dL — ABNORMAL LOW (ref 3.5–5.0)
Alkaline Phosphatase: 87 U/L (ref 38–126)
Anion gap: 13 (ref 5–15)
BUN: 66 mg/dL — ABNORMAL HIGH (ref 8–23)
CO2: 28 mmol/L (ref 22–32)
Calcium: 9.3 mg/dL (ref 8.9–10.3)
Chloride: 119 mmol/L — ABNORMAL HIGH (ref 98–111)
Creatinine, Ser: 0.87 mg/dL (ref 0.44–1.00)
GFR, Estimated: >60 mL/min (ref >60)
Glucose, Bld: 119 mg/dL — ABNORMAL HIGH (ref 70–99)
Potassium: 4.2 mmol/L (ref 3.5–5.1)
Sodium: 160 mmol/L — ABNORMAL HIGH (ref 135–145)
Total Bilirubin: 1.9 mg/dL — ABNORMAL HIGH (ref 0.3–1.2)
Total Protein: 5.2 g/dL — ABNORMAL LOW (ref 6.5–8.1)

## 2021-01-07 LAB — OSMOLALITY: Osmolality: 351 mOsm/kg (ref 275–295)

## 2021-01-07 LAB — APTT: aPTT: 25 seconds (ref 24–36)

## 2021-01-07 LAB — PROTIME-INR
INR: 1.2 (ref 0.8–1.2)
Prothrombin Time: 15 seconds (ref 11.4–15.2)

## 2021-01-07 LAB — SARS CORONAVIRUS 2 BY RT PCR (HOSPITAL ORDER, PERFORMED IN ~~LOC~~ HOSPITAL LAB): SARS Coronavirus 2: NEGATIVE

## 2021-01-07 LAB — OSMOLALITY, URINE: Osmolality, Ur: 695 mOsm/kg (ref 300–900)

## 2021-01-07 LAB — HEPATITIS PANEL, ACUTE
HCV Ab: NONREACTIVE
Hep A IgM: NONREACTIVE
Hep B C IgM: NONREACTIVE
Hepatitis B Surface Ag: NONREACTIVE

## 2021-01-07 LAB — BASIC METABOLIC PANEL
Anion gap: 10 (ref 5–15)
Anion gap: 14 (ref 5–15)
BUN: 61 mg/dL — ABNORMAL HIGH (ref 8–23)
BUN: 47 mg/dL — ABNORMAL HIGH (ref 8–23)
CO2: 30 mmol/L (ref 22–32)
CO2: 24 mmol/L (ref 22–32)
Calcium: 9.1 mg/dL (ref 8.9–10.3)
Calcium: 8.9 mg/dL (ref 8.9–10.3)
Chloride: 120 mmol/L — ABNORMAL HIGH (ref 98–111)
Chloride: 123 mmol/L — ABNORMAL HIGH (ref 98–111)
Creatinine, Ser: 0.75 mg/dL (ref 0.44–1.00)
Creatinine, Ser: 0.6 mg/dL (ref 0.44–1.00)
GFR, Estimated: >60 mL/min (ref >60)
GFR, Estimated: >60 mL/min (ref >60)
Glucose, Bld: 131 mg/dL — ABNORMAL HIGH (ref 70–99)
Glucose, Bld: 104 mg/dL — ABNORMAL HIGH (ref 70–99)
Potassium: 4 mmol/L (ref 3.5–5.1)
Potassium: 3.9 mmol/L (ref 3.5–5.1)
Sodium: 160 mmol/L — ABNORMAL HIGH (ref 135–145)
Sodium: 161 mmol/L (ref 135–145)

## 2021-01-07 LAB — AMMONIA: Ammonia: 30 umol/L (ref 9–35)

## 2021-01-07 LAB — TROPONIN I (HIGH SENSITIVITY)
Troponin I (High Sensitivity): 50 ng/L — ABNORMAL HIGH (ref <18)
Troponin I (High Sensitivity): 44 ng/L — ABNORMAL HIGH (ref <18)
Troponin I (High Sensitivity): 34 ng/L — ABNORMAL HIGH (ref <18)
Troponin I (High Sensitivity): 34 ng/L — ABNORMAL HIGH (ref <18)

## 2021-01-07 LAB — LACTIC ACID, PLASMA
Lactic Acid, Venous: 2.7 mmol/L (ref 0.5–1.9)
Lactic Acid, Venous: 2.5 mmol/L (ref 0.5–1.9)
Lactic Acid, Venous: 2.2 mmol/L (ref 0.5–1.9)

## 2021-01-07 LAB — POC SARS CORONAVIRUS 2 AG -  ED: SARS Coronavirus 2 Ag: NEGATIVE

## 2021-01-07 LAB — BRAIN NATRIURETIC PEPTIDE: B Natriuretic Peptide: 46 pg/mL (ref 0.0–100.0)

## 2021-01-07 LAB — SODIUM, URINE, RANDOM: Sodium, Ur: <10 mmol/L

## 2021-01-07 LAB — PROCALCITONIN: Procalcitonin: 0.55 ng/mL

## 2021-01-07 LAB — CBG MONITORING, ED: Glucose-Capillary: 145 mg/dL — ABNORMAL HIGH (ref 70–99)

## 2021-01-07 MED ORDER — ENOXAPARIN SODIUM 40 MG/0.4ML ~~LOC~~ SOLN
40.0000 mg | SUBCUTANEOUS | Status: DC
  Administered 2021-01-07 – 2021-01-11 (×5): 40 mg via SUBCUTANEOUS
  Filled 2021-01-07 (×5): qty 0.4

## 2021-01-07 MED ORDER — ASPIRIN EC 81 MG PO TBEC: 81.0000 mg | DELAYED_RELEASE_TABLET | Freq: Every day | ORAL | Status: DC

## 2021-01-07 MED ORDER — SODIUM CHLORIDE 0.9 % IV SOLN
2.0000 g | Freq: Once | INTRAVENOUS | Status: AC
  Administered 2021-01-07: 2 g via INTRAVENOUS
  Filled 2021-01-07: qty 2

## 2021-01-07 MED ORDER — IBUPROFEN 400 MG PO TABS: 400.0000 mg | ORAL_TABLET | Freq: Four times a day (QID) | ORAL | Status: DC | PRN

## 2021-01-07 MED ORDER — VITAMIN D3 25 MCG (1000 UNIT) PO TABS
2000.0000 [IU] | ORAL_TABLET | Freq: Every day | ORAL | Status: DC
  Administered 2021-01-09 – 2021-01-11 (×3): 2000 [IU] via ORAL
  Filled 2021-01-07 (×9): qty 2

## 2021-01-07 MED ORDER — LIDOCAINE 5 % EX PTCH
1.0000 | MEDICATED_PATCH | Freq: Every morning | CUTANEOUS | Status: DC
  Administered 2021-01-09 – 2021-01-12 (×3): 1 via TRANSDERMAL
  Filled 2021-01-07 (×7): qty 1

## 2021-01-07 MED ORDER — LACTATED RINGERS IV BOLUS (SEPSIS)
1000.0000 mL | Freq: Once | INTRAVENOUS | Status: AC
  Administered 2021-01-07: 1000 mL via INTRAVENOUS

## 2021-01-07 MED ORDER — VANCOMYCIN HCL IN DEXTROSE 1-5 GM/200ML-% IV SOLN: 1000.0000 mg | Freq: Once | INTRAVENOUS | Status: DC

## 2021-01-07 MED ORDER — MIRTAZAPINE 15 MG PO TABS
15.0000 mg | ORAL_TABLET | Freq: Every day | ORAL | Status: DC
Start: 2021-01-07 — End: 2021-01-12
  Administered 2021-01-09 – 2021-01-11 (×3): 15 mg via ORAL
  Filled 2021-01-07 (×3): qty 1

## 2021-01-07 MED ORDER — DONEPEZIL HCL 5 MG PO TABS
10.0000 mg | ORAL_TABLET | Freq: Every day | ORAL | Status: DC
Start: 2021-01-07 — End: 2021-01-12
  Administered 2021-01-09 – 2021-01-11 (×3): 10 mg via ORAL
  Filled 2021-01-07 (×3): qty 2

## 2021-01-07 MED ORDER — VANCOMYCIN HCL 1250 MG/250ML IV SOLN
1250.0000 mg | Freq: Once | INTRAVENOUS | Status: AC
  Administered 2021-01-07: 1250 mg via INTRAVENOUS
  Filled 2021-01-07: qty 250

## 2021-01-07 MED ORDER — OYSTER CALCIUM 500 MG PO TABS
500.0000 mg | ORAL_TABLET | Freq: Three times a day (TID) | ORAL | Status: DC
  Administered 2021-01-09 – 2021-01-11 (×5): 500 mg via ORAL
  Filled 2021-01-07 (×18): qty 1

## 2021-01-07 MED ORDER — VANCOMYCIN HCL 750 MG/150ML IV SOLN
750.0000 mg | INTRAVENOUS | Status: DC
  Administered 2021-01-08: 750 mg via INTRAVENOUS
  Filled 2021-01-07 (×2): qty 150

## 2021-01-07 MED ORDER — SODIUM CHLORIDE 0.9 % IV SOLN
2.0000 g | Freq: Two times a day (BID) | INTRAVENOUS | Status: DC
  Administered 2021-01-07 – 2021-01-08 (×3): 2 g via INTRAVENOUS
  Filled 2021-01-07 (×6): qty 2

## 2021-01-07 MED ORDER — ACETAMINOPHEN 650 MG RE SUPP
975.0000 mg | Freq: Once | RECTAL | Status: AC
  Administered 2021-01-07: 975 mg via RECTAL

## 2021-01-07 MED ORDER — BUPROPION HCL ER (XL) 150 MG PO TB24
150.0000 mg | ORAL_TABLET | Freq: Every day | ORAL | Status: DC
Start: 2021-01-07 — End: 2021-01-12
  Administered 2021-01-09 – 2021-01-11 (×3): 150 mg via ORAL
  Filled 2021-01-07 (×5): qty 1

## 2021-01-07 MED ORDER — LACTATED RINGERS IV SOLN: INTRAVENOUS | Status: AC

## 2021-01-07 MED ORDER — METRONIDAZOLE IN NACL 5-0.79 MG/ML-% IV SOLN
500.0000 mg | Freq: Three times a day (TID) | INTRAVENOUS | Status: DC
  Administered 2021-01-07 – 2021-01-09 (×6): 500 mg via INTRAVENOUS
  Filled 2021-01-07 (×9): qty 100

## 2021-01-07 MED ORDER — ASPIRIN 300 MG RE SUPP
300.0000 mg | Freq: Four times a day (QID) | RECTAL | Status: DC | PRN
  Filled 2021-01-07: qty 1

## 2021-01-07 MED ORDER — ONDANSETRON HCL 4 MG PO TABS: 4.0000 mg | ORAL_TABLET | Freq: Four times a day (QID) | ORAL | Status: DC | PRN

## 2021-01-07 MED ORDER — METRONIDAZOLE IN NACL 5-0.79 MG/ML-% IV SOLN
500.0000 mg | Freq: Once | INTRAVENOUS | Status: AC
  Administered 2021-01-07: 500 mg via INTRAVENOUS
  Filled 2021-01-07: qty 100

## 2021-01-07 MED ORDER — LACTATED RINGERS IV BOLUS (SEPSIS)
2000.0000 mL | Freq: Once | INTRAVENOUS | Status: AC
  Administered 2021-01-07: 2000 mL via INTRAVENOUS

## 2021-01-07 NOTE — Consult Note (Signed)
CODE SEPSIS - PHARMACY COMMUNICATION  **Broad Spectrum Antibiotics should be administered within 1 hour of Sepsis diagnosis**  Time Code Sepsis Called/Page Received: 0710  Antibiotics Ordered: 0715  Time of 1st antibiotic administration: 0736  Additional action taken by pharmacy: n/a  If necessary, Name of Provider/Nurse Contacted: n/a  Lorna Dibble ,PharmD Clinical Pharmacist  01/07/2021  7:23 AM

## 2021-01-07 NOTE — ED Notes (Signed)
Pt still asleep at this time  

## 2021-01-07 NOTE — ED Provider Notes (Signed)
[facility] Emergency Department Provider Note    Event Date/Time   First MD Initiated Contact with Patient 01/07/21 0700     (approximate)  I have reviewed the triage vital signs and the nursing notes.   HISTORY  Chief Complaint unresponsive  Level V Caveat:  AMS  HPI Jaylena A Hora is a 87 y.o. female below listed extensive past medical history presents to the ER for evaluation of altered mental status and found less responsive this morning by staff at [facility].  Last seen normal yesterday.  Was put on oxygen "for comfort "several days ago.  No report of any complaints or falls.  Patient found to be markedly febrile to 105 with foul-smelling urine.  Unable to provide any additional history.    Past Medical History:  Diagnosis Date  . Age-related osteoporosis with current pathological fracture with routine healing 01/23/2018  . Alzheimer's dementia, late onset ([place])   . Cataract cortical, senile   . Depression   . Diverticulosis 11/22/2008  . Dizziness   . Heart murmur   . Hemorrhoids   . History of colonic polyps 11/22/2008  . Hx of migraine headaches   . Hypertension   . Late onset Alzheimer's disease with behavioral disturbance ([place]) 01/23/2018  . Osteoarthritis   . Pelvic fracture ([place])   . Psoriasis, unspecified   . RBBB   . Thrombocytopenia (HCC)    mild  . Vitamin D deficiency disease    Family History  Problem Relation Age of Onset  . Arthritis Mother   . Mental illness Mother   . Stroke Mother   . Arthritis Father   . Atrial fibrillation Sister    Past Surgical History:  Procedure Laterality Date  . ABDOMINAL HYSTERECTOMY    . APPENDECTOMY    . CATARACT EXTRACTION EXTRACAPSULAR  2007   OU  ([place])  . CATARACT EXTRACTION, BILATERAL    . CHOLECYSTECTOMY    . COLONOSCOPY W/ POLYPECTOMY     06/2002, 11/22/2008 Nicolasa Ducking / [facility])  . INTRAMEDULLARY (IM) NAIL INTERTROCHANTERIC Right 01/18/2018   Procedure: INTRAMEDULLARY (IM) NAIL  INTERTROCHANTRIC (TFNA);  Surgeon: Lovell Sheehan, MD;  Location: [facility] ORS;  Service: Orthopedics;  Laterality: Right;  . MASTOIDECTOMY     Patient Active Problem List   Diagnosis Date Noted  . Status post hip surgery 03/21/2018  . Closed right hip fracture ([place]) 01/18/2018  . Altered mental status 06/07/2017  . Hyperglycemia 06/07/2017  . Dementia with behavioral disturbance ([place]) 06/07/2017  . Weakness 06/04/2017  . Late onset Alzheimer's disease without behavioral disturbance ([place]) 09/07/2016  . B12 deficiency 02/07/2016  . Major depressive disorder, recurrent, moderate ([place]) 02/05/2016  . Vitamin D deficiency disease   . RBBB   . Thrombocytopenia ([place])   . Heart murmur   . Essential hypertension 09/05/2014      Prior to Admission medications   Medication Sig Start Date End Date Taking? Authorizing Provider  acetaminophen (TYLENOL) 325 MG tablet Take 650 mg by mouth 4 (four) times daily.     [provider]  Amino Acids-Protein Hydrolys (FEEDING SUPPLEMENT, PRO-STAT SUGAR FREE 64,) LIQD Take 30 mLs by mouth 2 (two) times daily between meals. low protein, low albumin    [provider]  buPROPion (WELLBUTRIN XL) 300 MG 24 hr tablet Take 300 mg by mouth daily.    [provider]  divalproex (DEPAKOTE) 250 MG DR tablet Take 1 tablet (250 mg total) by mouth 2 (two) times daily. 06/07/17  Theodoro Grist, MD  donepezil (ARICEPT) 10 MG tablet Take 1 tablet (10 mg total) by mouth at bedtime. 11/29/16   Tat, Eustace Quail, DO  lactose free nutrition (BOOST) LIQD Take 237 mLs by mouth 3 (three) times daily between meals.    [provider]  LORazepam (ATIVAN) 0.5 MG tablet Take 0.5 mg by mouth every 4 (four) hours as needed. for restlessness/agitation    [provider]  mirtazapine (REMERON) 7.5 MG tablet Take 7.5 mg by mouth at bedtime.     [provider]  QUEtiapine (SEROQUEL) 25 MG tablet Take 25 mg by mouth 2 (two) times daily.     [provider]  vitamin B-12 (CYANOCOBALAMIN) 500 MCG tablet Take 500 mcg by mouth daily.    [provider]  Vitamin D, Ergocalciferol, (DRISDOL) 50000 units CAPS capsule Take 50,000 Units by mouth every 7 (seven) days.    [provider]    Allergies Patient has no known allergies.    Social History Social History   Tobacco Use  . Smoking status: Former Smoker    Packs/day: 0.50    Years: 15.00    Pack years: 7.50    Types: Cigarettes    Quit date: 12/14/2007    Years since quitting: 13.0  . Smokeless tobacco: Never Used  Vaping Use  . Vaping Use: Never used  Substance Use Topics  . Alcohol use: No    Alcohol/week: 0.0 standard drinks  . Drug use: No    Review of Systems Patient denies headaches, rhinorrhea, blurry vision, numbness, shortness of breath, chest pain, edema, cough, abdominal pain, nausea, vomiting, diarrhea, dysuria, fevers, rashes or hallucinations unless otherwise stated above in HPI. ____________________________________________   PHYSICAL EXAM:  VITAL SIGNS: Vitals:   01/07/21 0945 01/07/21 1000  BP:  (!) 98/55  Pulse: 93 93  Resp: 16 (!) 21  Temp:    SpO2: 97% 96%    Constitutional: Alert and oriented.  Eyes: Conjunctivae are normal.  Head: Atraumatic. Nose: No congestion/rhinnorhea. Mouth/Throat: Mucous membranes are moist.   Neck: No stridor. Painless ROM.  Cardiovascular: Normal rate, regular rhythm. Grossly normal heart sounds.  Good peripheral circulation. Respiratory: Normal respiratory effort.  No retractions. Lungs CTAB. Gastrointestinal: Soft and nontender. No distention. No abdominal bruits. No CVA tenderness. Genitourinary:  Musculoskeletal: No lower extremity tenderness nor edema.  No joint effusions. Neurologic:  Normal speech and language. No gross focal neurologic deficits are appreciated. No facial droop Skin:  Skin is warm, dry and intact. No rash noted. Psychiatric: Mood and affect are normal.  Speech and behavior are normal.  ____________________________________________   LABS (all labs ordered are listed, but only abnormal results are displayed)  Results for orders placed or performed during the hospital encounter of 01/07/21 (from the past 24 hour(s))  Lactic acid, plasma     Status: Abnormal   Collection Time: 01/07/21  6:49 AM  Result Value Ref Range   Lactic Acid, Venous 2.7 (HH) 0.5 - 1.9 mmol/L  CBC with Differential     Status: Abnormal   Collection Time: 01/07/21  6:49 AM  Result Value Ref Range   WBC 16.4 (H) 4.0 - 10.5 K/uL   RBC 5.90 (H) 3.87 - 5.11 MIL/uL   Hemoglobin 19.0 (H) 12.0 - 15.0 g/dL   HCT 62.2 (H) 36.0 - 46.0 %   MCV 105.4 (H) 80.0 - 100.0 fL   MCH 32.2 26.0 - 34.0 pg   MCHC 30.5 30.0 - 36.0 g/dL   RDW  13.1 11.5 - 15.5 %   Platelets 184 150 - 400 K/uL   nRBC 0.0 0.0 - 0.2 %   Neutrophils Relative % 73 %   Neutro Abs 11.9 (H) 1.7 - 7.7 K/uL   Lymphocytes Relative 18 %   Lymphs Abs 3.0 0.7 - 4.0 K/uL   Monocytes Relative 8 %   Monocytes Absolute 1.3 (H) 0.1 - 1.0 K/uL   Eosinophils Relative 0 %   Eosinophils Absolute 0.0 0.0 - 0.5 K/uL   Basophils Relative 0 %   Basophils Absolute 0.1 0.0 - 0.1 K/uL   Immature Granulocytes 1 %   Abs Immature Granulocytes 0.09 (H) 0.00 - 0.07 K/uL  Urinalysis, Complete w Microscopic Urine, Catheterized     Status: Abnormal   Collection Time: 01/07/21  6:49 AM  Result Value Ref Range   Color, Urine AMBER (A) YELLOW   APPearance CLOUDY (A) CLEAR   Specific Gravity, Urine 1.025 1.005 - 1.030   pH 5.0 5.0 - 8.0   Glucose, UA NEGATIVE NEGATIVE mg/dL   Hgb urine dipstick NEGATIVE NEGATIVE   Bilirubin Urine NEGATIVE NEGATIVE   Ketones, ur NEGATIVE NEGATIVE mg/dL   Protein, ur 100 (A) NEGATIVE mg/dL   Nitrite NEGATIVE NEGATIVE   Leukocytes,Ua NEGATIVE NEGATIVE   WBC, UA 0-5 0 - 5 WBC/hpf   Bacteria, UA MANY (A) NONE SEEN   Squamous Epithelial / LPF 6-10 0 - 5   Mucus PRESENT    Hyaline Casts, UA PRESENT    SARS Coronavirus 2 by RT PCR (hospital order, performed in [place] hospital lab) Nasopharyngeal Nasopharyngeal Swab     Status: None   Collection Time: 01/07/21  6:49 AM   Specimen: Nasopharyngeal Swab  Result Value Ref Range   SARS Coronavirus 2 NEGATIVE NEGATIVE  Comprehensive metabolic panel     Status: Abnormal   Collection Time: 01/07/21  6:49 AM  Result Value Ref Range   Sodium 160 (H) 135 - 145 mmol/L   Potassium 4.2 3.5 - 5.1 mmol/L   Chloride 119 (H) 98 - 111 mmol/L   CO2 28 22 - 32 mmol/L   Glucose, Bld 119 (H) 70 - 99 mg/dL   BUN 66 (H) 8 - 23 mg/dL   Creatinine, Ser 0.87 0.44 - 1.00 mg/dL   Calcium 9.3 8.9 - 10.3 mg/dL   Total Protein 5.2 (L) 6.5 - 8.1 g/dL   Albumin 2.8 (L) 3.5 - 5.0 g/dL   AST 162 (H) 15 - 41 U/L   ALT 119 (H) 0 - 44 U/L   Alkaline Phosphatase 87 38 - 126 U/L   Total Bilirubin 1.9 (H) 0.3 - 1.2 mg/dL   GFR, Estimated >60 >60 mL/min   Anion gap 13 5 - 15  Troponin I (High Sensitivity)     Status: Abnormal   Collection Time: 01/07/21  6:49 AM  Result Value Ref Range   Troponin I (High Sensitivity) 50 (H) <18 ng/L  CBG monitoring, ED     Status: Abnormal   Collection Time: 01/07/21  6:52 AM  Result Value Ref Range   Glucose-Capillary 145 (H) 70 - 99 mg/dL  POC SARS Coronavirus 2 Ag-ED - Nasal Swab (BD Veritor Kit)     Status: None   Collection Time: 01/07/21  7:07 AM  Result Value Ref Range   SARS Coronavirus 2 Ag NEGATIVE NEGATIVE   ____________________________________________  EKG My review and personal interpretation at Time: 6:46   Indication: ams  Rate: 140  Rhythm: sinus Axis: left Other:  normal intervals, nonspecific t wave abn, no stemi ____________________________________________  RADIOLOGY  I personally reviewed all radiographic images ordered to evaluate for the above acute complaints and reviewed radiology reports and findings.  These findings were personally discussed with the patient.  Please see medical record for  radiology report.  ____________________________________________   PROCEDURES  Procedure(s) performed:  Procedures    Critical Care performed: no ____________________________________________   INITIAL IMPRESSION / ASSESSMENT AND PLAN / ED COURSE  Pertinent labs & imaging results that were available during my care of the patient were reviewed by me and considered in my medical decision making (see chart for details).   DDX: Dehydration, sepsis, pna, uti, hypoglycemia, cva, drug effect, withdrawal, encephalitis   Sargun A Seaborn is a 87 y.o. who presents to the ED with presentation as described above.  Patient is critically ill-appearing significantly febrile presentation concerning for severe sepsis.  Blood work obtained will start IV fluids as well as broad-spectrum IV antibiotics.  She arrives with a signed DNR form.  Clinical Course as of 01/07/21 1027  Wed Jan 07, 2021  0800 Patient's daughter at bedside and states that patient would not want any heroic or aggressive measures. Agreeable to trial of antibiotics but understands that this is likely end-of-life that she is had significant deterioration over the past several weeks and months. Would not want any further diagnostic imaging like CT scans would prefer to focus on comfort measures. We will cancel code sepsis. We will continue to manage her symptoms here in the ER. [PR]  1025 Patient with significant hypernatremia.  She is receiving IV fluids.  Covid is negative.  Has received broad-spectrum antibiotics.  I discussed my concern for poor prognosis given age advanced dementia and electrolyte abnormalities.  Family at bedside understands.  She would like to have a little bit more time to decide whether she would want antibiotics continued but agrees with plan for admission for comfort measures focused care. [PR]    Clinical Course User Index [PR] Merlyn Lot, MD    The patient was evaluated in Emergency Department today for  the symptoms described in the history of present illness. He/she was evaluated in the context of the global COVID-19 pandemic, which necessitated consideration that the patient might be at risk for infection with the SARS-CoV-2 virus that causes COVID-19. Institutional protocols and algorithms that pertain to the evaluation of patients at risk for COVID-19 are in a state of rapid change based on information released by regulatory bodies including the CDC and federal and state organizations. These policies and algorithms were followed during the patient's care in the ED.  As part of my medical decision making, I reviewed the following data within the [place] notes reviewed and incorporated, Labs reviewed, notes from prior ED visits and [place] Controlled Substance Database   ____________________________________________   FINAL CLINICAL IMPRESSION(S) / ED DIAGNOSES  Final diagnoses:  Severe sepsis ([place])  Hypernatremia      NEW MEDICATIONS STARTED DURING THIS VISIT:  New Prescriptions   No medications on file     Note:  This document was prepared using Dragon voice recognition software and may include unintentional dictation errors.    Merlyn Lot, MD 01/07/21 1027

## 2021-01-07 NOTE — Sepsis Progress Note (Signed)
Code sepsis protocol order cancelled by MD at 0800

## 2021-01-07 NOTE — ED Notes (Signed)
Pt's rectal temp taken with reading 97.6. Pt initial temp 105.2. MD made aware.

## 2021-01-07 NOTE — ED Notes (Signed)
Date and time results received: 01/07/21    Test: Sodium Critical Value:161  Name of Provider Notified: admitting

## 2021-01-07 NOTE — H&P (Signed)
History and Physical    [name]:[id number] DOB: 18-Jan-1934 DOA: 01/07/2021  Referring MD/NP/PA:   PCP: Willette Pa, PA-C   Patient coming from:  The patient is coming from memory unit of assisted living facility per her daughter.  At baseline, pt is dependent for most of ADL.        Chief Complaint: fever, worsening mental status, diarrhea  HPI: [name] is a 87 y.o. female with medical history significant of late onset Alzheimer's disease with behavior disturbance, hypertension, depression, anxiety, right bundle blockade, thrombocytopenia, psoriasis, who presents with fever, worsened mental status and diarrhea.  Patient has dementia, worsening mental status, not arousable, cannot provide any medical history.  Per her daughter, patient was noted to have fever of 105.2 since last night.  Patient also has diarrhea, does not seem to have chest pain, respiratory distress, cough, shortness breath or abdominal pain.  Not sure if patient has symptoms of UTI.  She slightly moves her extremities on painful stimuli.  No facial droop noted.  Per her daughter, at her normal baseline, patient could talk to family members, usually not oriented to time and place.  ED Course: pt was found to have WBC 16.4, sodium 160, lactic acid of 2.7, 2.5, troponin level 50, BNP 46, urinalysis (cloudy appearance, negative leukocyte, many bacteria, WBC 0-5), negative Covid PCR, ammonia level 30, abnormal liver function (ALP 87, AST 162, ALT 119, total bilirubin 1.9), temperature 105.2, soft blood pressure, heart rate 137, RR 32, oxygen saturation 96% on room air.  Chest x-ray showed low volume and bilateral mild atelectasis.  Patient is admitted to progressive bed as inpatient.  [place] of Systems: Could not be reviewed due to Alzheimer's and worsening mental status.   Allergy: No Known Allergies  Past Medical History:  Diagnosis Date  . Age-related osteoporosis with current pathological fracture with  routine healing 01/23/2018  . Alzheimer's dementia, late onset ([place])   . Cataract cortical, senile   . Depression   . Diverticulosis 11/22/2008  . Dizziness   . Heart murmur   . Hemorrhoids   . History of colonic polyps 11/22/2008  . Hx of migraine headaches   . Hypertension   . Late onset Alzheimer's disease with behavioral disturbance ([place]) 01/23/2018  . Osteoarthritis   . Pelvic fracture ([place])   . Psoriasis, unspecified   . RBBB   . Thrombocytopenia (HCC)    mild  . Vitamin D deficiency disease     Past Surgical History:  Procedure Laterality Date  . ABDOMINAL HYSTERECTOMY    . APPENDECTOMY    . CATARACT EXTRACTION EXTRACAPSULAR  2007   OU  ([place])  . CATARACT EXTRACTION, BILATERAL    . CHOLECYSTECTOMY    . COLONOSCOPY W/ POLYPECTOMY     06/2002, 11/22/2008 Nicolasa Ducking / [facility])  . INTRAMEDULLARY (IM) NAIL INTERTROCHANTERIC Right 01/18/2018   Procedure: INTRAMEDULLARY (IM) NAIL INTERTROCHANTRIC ([facility]);  Surgeon: Lovell Sheehan, MD;  Location: [facility] ORS;  Service: Orthopedics;  Laterality: Right;  . MASTOIDECTOMY      Social History:  reports that she quit smoking about 13 years ago. Her smoking use included cigarettes. She has a 7.50 pack-year smoking history. She has never used smokeless tobacco. She reports that she does not drink alcohol and does not use drugs.  Family History:  Family History  Problem Relation Age of Onset  . Arthritis Mother   . Mental illness Mother   . Stroke Mother   . Arthritis Father   .  Atrial fibrillation Sister      Prior to Admission medications   Medication Sig Start Date End Date Taking? Authorizing Provider  acetaminophen (TYLENOL) 325 MG tablet Take 650 mg by mouth 4 (four) times daily.     [provider]  Amino Acids-Protein Hydrolys (FEEDING SUPPLEMENT, PRO-STAT SUGAR FREE 64,) LIQD Take 30 mLs by mouth 2 (two) times daily between meals. low protein, low albumin    [provider]  buPROPion (WELLBUTRIN XL) 300 MG 24  hr tablet Take 300 mg by mouth daily.    [provider]  divalproex (DEPAKOTE) 250 MG DR tablet Take 1 tablet (250 mg total) by mouth 2 (two) times daily. 06/07/17   Theodoro Grist, MD  donepezil (ARICEPT) 10 MG tablet Take 1 tablet (10 mg total) by mouth at bedtime. 11/29/16   Tat, Eustace Quail, DO  lactose free nutrition (BOOST) LIQD Take 237 mLs by mouth 3 (three) times daily between meals.    [provider]  LORazepam (ATIVAN) 0.5 MG tablet Take 0.5 mg by mouth every 4 (four) hours as needed. for restlessness/agitation    [provider]  mirtazapine (REMERON) 7.5 MG tablet Take 7.5 mg by mouth at bedtime.     [provider]  QUEtiapine (SEROQUEL) 25 MG tablet Take 25 mg by mouth 2 (two) times daily.    [provider]  vitamin B-12 (CYANOCOBALAMIN) 500 MCG tablet Take 500 mcg by mouth daily.    [provider]  Vitamin D, Ergocalciferol, (DRISDOL) 50000 units CAPS capsule Take 50,000 Units by mouth every 7 (seven) days.    [provider]    Physical Exam: Vitals:   01/07/21 1500 01/07/21 1530 01/07/21 1600 01/07/21 1630  BP: 117/63 123/70 112/60 112/60  Pulse: 87 88 84 85  Resp: 19 (!) 21 (!) 24 (!) 35  Temp:      TempSrc:      SpO2: 97% 98% 98% 97%  Weight:      Height:       General: Not in acute distress.  Dry mucosal membrane HEENT:       Eyes: no scleral icterus.       ENT: No discharge from the ears and nose.       Neck: No JVD, no bruit, no mass felt. Heme: No neck lymph node enlargement. Cardiac: S1/S2, RRR, 1/6 murmurs, No gallops or rubs. Respiratory: No rales, wheezing, rhonchi or rubs. GI: Soft, nondistended, nontender, no organomegaly, BS present. GU: No hematuria Ext: has trace leg edema bilaterally. 1+DP/PT pulse bilaterally. Musculoskeletal: No joint deformities, No joint redness or warmth, no limitation of ROM in spin. Skin: No rashes.  Neuro: Patient is in coma, not arousable, not following  command. She slightly moves her extremities on painful stimuli Psych: Patient is not psychotic, no suicidal or hemocidal ideation.  Labs on Admission: I have personally reviewed following labs and imaging studies  CBC: Recent Labs  Lab 01/07/21 0649  WBC 16.4*  NEUTROABS 11.9*  HGB 19.0*  HCT 62.2*  MCV 105.4*  PLT [id number]   Basic Metabolic Panel: Recent Labs  Lab 01/07/21 0649 01/07/21 1148  NA 160* 160*  K 4.2 4.0  CL 119* 120*  CO2 28 30  GLUCOSE 119* 131*  BUN 66* 61*  CREATININE 0.87 0.75  CALCIUM 9.3 9.1   GFR: Estimated Creatinine Clearance: 48.8 mL/min (by C-G formula based on SCr of 0.75 mg/dL). Liver Function Tests: Recent Labs  Lab 01/07/21 0649  AST 162*  ALT 119*  ALKPHOS 87  BILITOT 1.9*  PROT 5.2*  ALBUMIN 2.8*   No results for input(s): LIPASE, AMYLASE in the last 168 hours. Recent Labs  Lab 01/07/21 1148  AMMONIA 30   Coagulation Profile: No results for input(s): INR, PROTIME in the last 168 hours. Cardiac Enzymes: No results for input(s): CKTOTAL, CKMB, CKMBINDEX, TROPONINI in the last 168 hours. BNP (last 3 results) No results for input(s): PROBNP in the last 8760 hours. HbA1C: No results for input(s): HGBA1C in the last 72 hours. CBG: Recent Labs  Lab 01/07/21 0652  GLUCAP 145*   Lipid Profile: No results for input(s): CHOL, HDL, LDLCALC, TRIG, CHOLHDL, LDLDIRECT in the last 72 hours. Thyroid Function Tests: No results for input(s): TSH, T4TOTAL, FREET4, T3FREE, THYROIDAB in the last 72 hours. Anemia Panel: No results for input(s): VITAMINB12, FOLATE, FERRITIN, TIBC, IRON, RETICCTPCT in the last 72 hours. Urine analysis:    Component Value Date/Time   COLORURINE AMBER (A) 01/07/2021 0649   APPEARANCEUR CLOUDY (A) 01/07/2021 0649   APPEARANCEUR Cloudy (A) 04/19/2016 0945   LABSPEC 1.025 01/07/2021 0649   LABSPEC 1.019 03/06/2012 2000   PHURINE 5.0 01/07/2021 0649   GLUCOSEU NEGATIVE 01/07/2021 0649   GLUCOSEU Negative  03/06/2012 2000   HGBUR NEGATIVE 01/07/2021 [place] 01/07/2021 0649   BILIRUBINUR Negative 04/19/2016 0945   BILIRUBINUR Negative 03/06/2012 2000   KETONESUR NEGATIVE 01/07/2021 0649   PROTEINUR 100 (A) 01/07/2021 0649   NITRITE NEGATIVE 01/07/2021 0649   LEUKOCYTESUR NEGATIVE 01/07/2021 0649   LEUKOCYTESUR Negative 03/06/2012 2000   Sepsis Labs: @LABRCNTIP (procalcitonin:4,lacticidven:4) ) Recent Results (from the past 240 hour(s))  SARS Coronavirus 2 by RT PCR (hospital order, performed in [place] hospital lab) Nasopharyngeal Nasopharyngeal Swab     Status: None   Collection Time: 01/07/21  6:49 AM   Specimen: Nasopharyngeal Swab  Result Value Ref Range Status   SARS Coronavirus 2 NEGATIVE NEGATIVE Final    Comment: (NOTE) SARS-CoV-2 target nucleic acids are NOT DETECTED.  The SARS-CoV-2 RNA is generally detectable in upper and lower respiratory specimens during the acute phase of infection. The lowest concentration of SARS-CoV-2 viral copies this assay can detect is 250 copies / mL. A negative result does not preclude SARS-CoV-2 infection and should not be used as the sole basis for treatment or other patient management decisions.  A negative result may occur with improper specimen collection / handling, submission of specimen other than nasopharyngeal swab, presence of viral mutation(s) within the areas targeted by this assay, and inadequate number of viral copies (<250 copies / mL). A negative result must be combined with clinical observations, patient history, and epidemiological information.  Fact Sheet for Patients:   StrictlyIdeas.no  Fact Sheet for Healthcare Providers: BankingDealers.co.za  This test is not yet approved or  cleared by the [place] FDA and has been authorized for detection and/or diagnosis of SARS-CoV-2 by FDA under an Emergency Use Authorization (EUA).  This EUA will  remain in effect (meaning this test can be used) for the duration of the COVID-19 declaration under Section 564(b)(1) of the Act, 21 U.S.C. section 360bbb-3(b)(1), unless the authorization is terminated or revoked sooner.  Performed at [facility], [address], [place], [place] [address]      Radiological Exams on Admission: DG Chest [facility] 1 View  Result Date: 01/07/2021 CLINICAL DATA:  Altered mental status. EXAM: PORTABLE CHEST 1 VIEW COMPARISON:  Chest x-ray 01/17/2018. FINDINGS: Mediastinum hilar structures normal. Low lung volumes  with mild bibasilar atelectasis. No prominent pleural effusion. No pneumothorax. Heart size normal. Thoracic spine scoliosis and degenerative change. Surgical clips right upper quadrant. IMPRESSION: Low lung volumes with mild bibasilar atelectasis. Electronically Signed   By: Marcello Moores  Register   On: 01/07/2021 07:02     EKG: I have personally reviewed.  Sinus rhythm, QTC 444, bifascicular block, nonspecific T wave change  Assessment/Plan Principal Problem:   Severe sepsis (HCC) Active Problems:   Dementia with behavioral disturbance (HCC)   Acute metabolic encephalopathy   Depression with anxiety   UTI (urinary tract infection)   Hypernatremia   Elevated troponin   Abnormal LFTs   Diarrhea   Comatose ([place])   Severe sepsis: Patient meets criteria for severe sepsis with leukocytosis, fever, tachypnea, tachycardia.  Lactic acid elevated at 2.7, 2.5.  Blood pressures are soft.  Source of infection is not completely clear, but possibly due to UTI.  Another possibility is biliary systemic infection given abnormal liver function.  Per patient's daughter, will hold off further images, and treat patient empirically with broad antibiotics.  If patient deteriorates, then will consider comfort care.  -Will admit to progressive bed as inpatient -Vancomycin, cefepime and Flagyl -Follow-up blood culture and urine culture -will get Procalcitonin and  trend lactic acid levels per sepsis protocol. -IVF: 3L of LR bolus, followed by 100 cc/h   Hypernatremia: Sodium level 160, likely due to dehydration -IVF: as above -check BMP q8h -check urine Osmo and urine sodium level, TSH  Dementia with behavioral disturbance (HCC) -Donepezil  Acute metabolic encephalopathy and comatose:: Ammonia level 30.  Possibly due to severe sepsis -hold off brain image per her daughter -Frequent neuro check -Treat as sepsis as above  Depression with anxiety -Remeron  Possible UTI (urinary tract infection): -On broad antibiotics as above -Follow-up urine culture and blood culture  Elevated troponin: Troponin 50, 44, 34, likely due to demand ischemia secondary to severe sepsis -Trend troponin -Check A1c, FLP -Patient is on as needed aspirin for fever  Abnormal LFTs -Check hepatitis panel -Hold off ultrasound of right upper quadrant per her daughter  Diarrhea -IV fluid -Check C. difficile PCR and GI pathogen panel        DVT ppx:  SQ Lovenox Code Status: DNR per her daughter who is the power of attorney Family Communication: Yes, patient's daughter   at bed side Disposition Plan:  Anticipate discharge back to previous environment Consults called: None Admission status and Level of care: Progressive Cardiac progressive unit  as inpt       Status is: Inpatient  Remains inpatient appropriate because:Inpatient level of care appropriate due to severity of illness   Dispo: The patient is from: ALF              Anticipated d/c is to: ALF              Anticipated d/c date is: 2 days              Patient currently is not medically stable to d/c.   Difficult to place patient No        Date of Service 01/07/2021    [facility]   If 7PM-7AM, please contact night-coverage [email or web address] 01/07/2021, 5:04 PM

## 2021-01-07 NOTE — ED Notes (Addendum)
Only able to obtain 1 set of blood cultures at this time. MD made aware. Antibiotics will be initiated as to not delay pt care

## 2021-01-07 NOTE — ED Notes (Signed)
Repeat trop and 2nd set of [facility] sent to lab

## 2021-01-07 NOTE — Sepsis Progress Note (Signed)
Sepsis protocol being followed by [facility] 

## 2021-01-07 NOTE — ED Notes (Signed)
Called pharmacy to to send maxipime due to ED pyxis being out of stock

## 2021-01-07 NOTE — Progress Notes (Signed)
Pharmacy Antibiotic Note  [name] is a 87 y.o. female admitted on 01/07/2021. Pharmacy has been consulted for vancomycin and cefepime dosing.  Plan: Vancomycin 1250 mg IV x 1 followed by 750 mg IV Q 24 hrs  Goal AUC 400-550 Expected AUC: 480 SCr used: 1.0  Cefepime 2 g IV q12h   Height: 5\' 7"  (170.2 cm) Weight: 59 kg (130 lb) IBW/kg (Calculated) : 61.6  Temp (24hrs), Avg:101.4 F (38.6 C), Min:97.6 F (36.4 C), Max:105.2 F (40.7 C)  Recent Labs  Lab 01/07/21 0649 01/07/21 1148  WBC 16.4*  --   CREATININE 0.87 0.75  LATICACIDVEN 2.7* 2.5*    Estimated Creatinine Clearance: 48.8 mL/min (by C-G formula based on SCr of 0.75 mg/dL).    No Known Allergies  Antimicrobials this admission: Vancomycin 1/26 >> Cefepime 1/26 >> Metronidazole 1/26 >>  Microbiology results: 1/26 BCx: pending 1/26 UCx: pending    Thank you for allowing pharmacy to be a part of this patient's care.  Tawnya Crook, PharmD 01/07/2021 5:39 PM

## 2021-01-07 NOTE — ED Triage Notes (Addendum)
Pt to ED via EMS from [facility]. Pt arrives responsive to pain only. Per facility pt is normally able to hold conversation. Pt smells of urine and has soiled brief. Pts temp 105.2 on arrival, ems cbg 181 and was given 225ml ns. Per facility pt was found like this "a few hours ago"

## 2021-01-07 NOTE — Consult Note (Signed)
PHARMACY -  BRIEF ANTIBIOTIC NOTE   Pharmacy has received consult(s) for vancomycin and cefepime from an ED provider.  The patient's profile has been reviewed for ht/wt/allergies/indication/available labs.    One time order(s) placed for: Vancomycin 1.25g x1  Cefepime 2g x1 Metronidazole 500mg  IV x1  Further antibiotics/pharmacy consults should be ordered by admitting physician if indicated.                       Thank you, Lorna Dibble 01/07/2021  7:20 AM

## 2021-01-08 DIAGNOSIS — A419 Sepsis, unspecified organism: Secondary | ICD-10-CM | POA: Diagnosis present

## 2021-01-08 LAB — CBC
HCT: 47.1 % — ABNORMAL HIGH (ref 36.0–46.0)
Hemoglobin: 14.2 g/dL (ref 12.0–15.0)
MCH: 32.3 pg (ref 26.0–34.0)
MCHC: 30.1 g/dL (ref 30.0–36.0)
MCV: 107 fL — ABNORMAL HIGH (ref 80.0–100.0)
Platelets: 98 10*3/uL — ABNORMAL LOW (ref 150–400)
RBC: 4.4 MIL/uL (ref 3.87–5.11)
RDW: 12.7 % (ref 11.5–15.5)
WBC: 14.5 10*3/uL — ABNORMAL HIGH (ref 4.0–10.5)
nRBC: 0 % (ref 0.0–0.2)

## 2021-01-08 LAB — BASIC METABOLIC PANEL
Anion gap: 9 (ref 5–15)
BUN: 41 mg/dL — ABNORMAL HIGH (ref 8–23)
CO2: 27 mmol/L (ref 22–32)
Calcium: 8.5 mg/dL — ABNORMAL LOW (ref 8.9–10.3)
Chloride: 121 mmol/L — ABNORMAL HIGH (ref 98–111)
Creatinine, Ser: 0.4 mg/dL — ABNORMAL LOW (ref 0.44–1.00)
GFR, Estimated: >60 mL/min (ref >60)
Glucose, Bld: 86 mg/dL (ref 70–99)
Potassium: 3.5 mmol/L (ref 3.5–5.1)
Sodium: 157 mmol/L — ABNORMAL HIGH (ref 135–145)

## 2021-01-08 LAB — HEPATIC FUNCTION PANEL
ALT: 103 U/L — ABNORMAL HIGH (ref 0–44)
AST: 81 U/L — ABNORMAL HIGH (ref 15–41)
Albumin: 2.7 g/dL — ABNORMAL LOW (ref 3.5–5.0)
Alkaline Phosphatase: 87 U/L (ref 38–126)
Bilirubin, Direct: 0.2 mg/dL (ref 0.0–0.2)
Indirect Bilirubin: 0.7 mg/dL (ref 0.3–0.9)
Total Bilirubin: 0.9 mg/dL (ref 0.3–1.2)
Total Protein: 5.1 g/dL — ABNORMAL LOW (ref 6.5–8.1)

## 2021-01-08 LAB — LIPID PANEL
Cholesterol: 101 mg/dL (ref 0–200)
HDL: 17 mg/dL — ABNORMAL LOW (ref >40)
LDL Cholesterol: 47 mg/dL (ref 0–99)
Total CHOL/HDL Ratio: 5.9 RATIO
Triglycerides: 184 mg/dL — ABNORMAL HIGH (ref <150)
VLDL: 37 mg/dL (ref 0–40)

## 2021-01-08 LAB — HEMOGLOBIN A1C
Hgb A1c MFr Bld: 5.3 % (ref 4.8–5.6)
Mean Plasma Glucose: 105.41 mg/dL

## 2021-01-08 LAB — TSH: TSH: 0.307 u[IU]/mL — ABNORMAL LOW (ref 0.350–4.500)

## 2021-01-08 MED ORDER — DEXTROSE-NACL 5-0.45 % IV SOLN: INTRAVENOUS | Status: DC

## 2021-01-08 NOTE — ED Notes (Signed)
Pt is still asleep. Pt is comfortable.

## 2021-01-08 NOTE — Progress Notes (Signed)
PROGRESS NOTE    [name]  UJW:[id number] DOB: [birth year] DOA: 01/07/2021 PCP: Caralyn Guile    Brief Narrative:  87 y.o. female with medical history significant of late onset Alzheimer's disease with behavior disturbance, hypertension, depression, anxiety, right bundle blockade, thrombocytopenia, psoriasis, who presents with fever, worsened mental status and diarrhea.  Patient has dementia, worsening mental status, not arousable, cannot provide any medical history.  Per her daughter, patient was noted to have fever of 105.2 since last night.  Patient also has diarrhea, does not seem to have chest pain, respiratory distress, cough, shortness breath or abdominal pain.  Not sure if patient has symptoms of UTI.  She slightly moves her extremities on painful stimuli.  No facial droop noted.  Per her daughter, at her normal baseline, patient could talk to family members, usually not oriented to time and place.  1/27: Daughter was present at bedside my evaluation this morning.  Reports that her mother is a little bit better this morning.  Opens eyes.  Attempting to speak though speech remains somewhat garbled.   Assessment & Plan:   Principal Problem:   Severe sepsis ([place]) Active Problems:   Dementia with behavioral disturbance (HCC)   Acute metabolic encephalopathy   Depression with anxiety   UTI (urinary tract infection)   Hypernatremia   Elevated troponin   Abnormal LFTs   Diarrhea   Comatose ([place])  Severe sepsis:  Patient meets criteria for severe sepsis with leukocytosis, fever, tachypnea, tachycardia.   Lactic acid elevated at 2.7, 2.5.   Blood pressures are soft.   Source of infection is not completely clear, but possibly due to UTI.   Another possibility is biliary systemic infection given abnormal liver function.   Per patient's daughter, will hold off further images, and treat patient empirically with broad antibiotics.   If patient deteriorates, then will  consider comfort care. Plan: Continue broad-spectrum IV antibiotic therapy for now Continue intravenous fluids Monitor cultures and fever curve Defer imaging for now If patient deteriorates will discuss with daughter regarding transition to comfort measures  Hypernatremia Sodium level 160, likely due to dehydration Plan: D5 half-normal saline Recheck sodium in a.m.   Dementia with behavioral disturbance (HCC) -Donepezil  Acute metabolic encephalopathy and comatose::  Ammonia level 30.  Possibly due to severe sepsis -Defer imaging at this time -Frequent neuro check -Treat as sepsis as above  Depression with anxiety -Remeron  Possible UTI (urinary tract infection): -On broad antibiotics as above -Follow-up urine culture and blood culture  Elevated troponin: Troponin 50, 44, 34, likely due to demand ischemia secondary to severe sepsis -Trend troponin -Check A1c, FLP -Patient is on as needed aspirin for fever  Abnormal LFTs -Check hepatitis panel -Hold off ultrasound of right upper quadrant per her daughter  Diarrhea -IV fluid -Check C. difficile PCR and GI pathogen panel   DVT prophylaxis: Lovenox Code Status: DNR Family Communication: Daughter at bedside Disposition Plan: Status is: Inpatient  Remains inpatient appropriate because:Inpatient level of care appropriate due to severity of illness   Dispo: The patient is from: SNF              Anticipated d/c is to: SNF              Anticipated d/c date is: 2 days              Patient currently is not medically stable to d/c.   Difficult to place patient No  Severe sepsis.  Source  unclear.  Per daughter's request are holding off on any advanced imaging at this time.  Will treat empirically with broad-spectrum IV antibiotics.  If patient deteriorates will have further discussion regarding transition to comfort measures.     Level of care: Progressive Cardiac  Consultants:  None  Procedures:    None  Antimicrobials  Vancomycin  Cefepime  Flagyl   Subjective: Patient seen and examined.  Daughter at bedside.  Patient minimally communicative but is trying to speak.  Hemodynamically stable.  Does not provide history.  Objective: Vitals:   01/08/21 1230 01/08/21 1300 01/08/21 1330 01/08/21 1349  BP: 126/64 135/63 127/62   Pulse: 84 81 80   Resp: (!) 22 (!) 21 (!) 21   Temp:    (!) 97.5 F (36.4 C)  TempSrc:    Oral  SpO2: 98% 97% 98%   Weight:      Height:        Intake/Output Summary (Last 24 hours) at 01/08/2021 1404 Last data filed at 01/08/2021 1400 Gross per 24 hour  Intake -  Output 800 ml  Net -800 ml   Filed Weights   01/07/21 0648  Weight: 59 kg    Examination:  General exam: No acute distress.  Appears frail Respiratory system: Poor respiratory effort.  Lungs clear.  Normal work of breathing Cardiovascular system: S1 & S2 heard, RRR. No JVD, murmurs, rubs, gallops or clicks. No pedal edema. Gastrointestinal system: Abdomen is nondistended, soft and nontender. No organomegaly or masses felt. Normal bowel sounds heard. Central nervous system: Lethargic, oriented x3, no focal deficits Extremities: No edema, decreased power bilaterally Skin: Pale, scattered excoriations Psychiatry: Judgement and insight appear impaired. Mood & affect flattened.     Data Reviewed: I have personally reviewed following labs and imaging studies  CBC: Recent Labs  Lab 01/07/21 0649 01/08/21 0458  WBC 16.4* 14.5*  NEUTROABS 11.9*  --   HGB 19.0* 14.2  HCT 62.2* 47.1*  MCV 105.4* 107.0*  PLT 184 98*   Basic Metabolic Panel: Recent Labs  Lab 01/07/21 0649 01/07/21 1148 01/07/21 1832 01/08/21 0458  NA 160* 160* 161* 157*  K 4.2 4.0 3.9 3.5  CL 119* 120* 123* 121*  CO2 28 30 24 27   GLUCOSE 119* 131* 104* 86  BUN 66* 61* 47* 41*  CREATININE 0.87 0.75 0.60 0.40*  CALCIUM 9.3 9.1 8.9 8.5*   GFR: Estimated Creatinine Clearance: 48.8 mL/min (A) (by C-G  formula based on SCr of 0.4 mg/dL (L)). Liver Function Tests: Recent Labs  Lab 01/07/21 0649 01/08/21 0458  AST 162* 81*  ALT 119* 103*  ALKPHOS 87 87  BILITOT 1.9* 0.9  PROT 5.2* 5.1*  ALBUMIN 2.8* 2.7*   No results for input(s): LIPASE, AMYLASE in the last 168 hours. Recent Labs  Lab 01/07/21 1148  AMMONIA 30   Coagulation Profile: Recent Labs  Lab 01/07/21 1832  INR 1.2   Cardiac Enzymes: No results for input(s): CKTOTAL, CKMB, CKMBINDEX, TROPONINI in the last 168 hours. BNP (last 3 results) No results for input(s): PROBNP in the last 8760 hours. HbA1C: Recent Labs    01/08/21 0458  HGBA1C 5.3   CBG: Recent Labs  Lab 01/07/21 0652  GLUCAP 145*   Lipid Profile: Recent Labs    01/08/21 0458  CHOL 101  HDL 17*  LDLCALC 47  TRIG 184*  CHOLHDL 5.9   Thyroid Function Tests: Recent Labs    01/08/21 0458  TSH 0.307*   Anemia Panel: No results for  input(s): VITAMINB12, FOLATE, FERRITIN, TIBC, IRON, RETICCTPCT in the last 72 hours. Sepsis Labs: Recent Labs  Lab 01/07/21 4287 01/07/21 1148 01/07/21 1832  PROCALCITON  --   --  0.55  LATICACIDVEN 2.7* 2.5* 2.2*    Recent Results (from the past 240 hour(s))  Culture, blood (routine x 2)     Status: None (Preliminary result)   Collection Time: 01/07/21  6:49 AM   Specimen: BLOOD  Result Value Ref Range Status   Specimen Description BLOOD RIGHT HAND  Final   Special Requests   Final    BOTTLES DRAWN AEROBIC AND ANAEROBIC Blood Culture results may not be optimal due to an inadequate volume of blood received in culture bottles   Culture   Final    NO GROWTH < 24 HOURS Performed at [facility], [address] [place], [place] [address]    Report Status PENDING  Incomplete  Culture, blood (routine x 2)     Status: None (Preliminary result)   Collection Time: 01/07/21  6:49 AM   Specimen: BLOOD  Result Value Ref Range Status   Specimen Description BLOOD LEFT ANTECUBITAL  Final   Special  Requests   Final    BOTTLES DRAWN AEROBIC AND ANAEROBIC Blood Culture results may not be optimal due to an inadequate volume of blood received in culture bottles   Culture   Final    NO GROWTH < 24 HOURS Performed at [facility], [address] [place], [place] [address]    Report Status PENDING  Incomplete  Urine culture     Status: Abnormal (Preliminary result)   Collection Time: 01/07/21  6:49 AM   Specimen: Urine, Random  Result Value Ref Range Status   Specimen Description   Final    URINE, RANDOM Performed at [facility], [address] [place], [place] [address]    Special Requests   Final    NONE Performed at [facility], [address], [place], [place] [address]    Culture (A)  Final    >=100,000 COLONIES/mL ESCHERICHIA COLI SUSCEPTIBILITIES TO FOLLOW Performed at [facility], [address] [place], [place] [address]    Report Status PENDING  Incomplete  SARS Coronavirus 2 by RT PCR (hospital order, performed in [facility] hospital lab) Nasopharyngeal Nasopharyngeal Swab     Status: None   Collection Time: 01/07/21  6:49 AM   Specimen: Nasopharyngeal Swab  Result Value Ref Range Status   SARS Coronavirus 2 NEGATIVE NEGATIVE Final    Comment: (NOTE) SARS-CoV-2 target nucleic acids are NOT DETECTED.  The SARS-CoV-2 RNA is generally detectable in upper and lower respiratory specimens during the acute phase of infection. The lowest concentration of SARS-CoV-2 viral copies this assay can detect is 250 copies / mL. A negative result does not preclude SARS-CoV-2 infection and should not be used as the sole basis for treatment or other patient management decisions.  A negative result may occur with improper specimen collection / handling, submission of specimen other than nasopharyngeal swab, presence of viral mutation(s) within the areas targeted by this assay, and inadequate number of viral copies (<250 copies / mL). A  negative result must be combined with clinical observations, patient history, and epidemiological information.  Fact Sheet for Patients:   BoilerBrush.com.cy  Fact Sheet for Healthcare Providers: https://pope.com/  This test is not yet approved or  cleared by the [place] FDA and has been authorized for detection and/or diagnosis of SARS-CoV-2 by FDA  under an Emergency Use Authorization (EUA).  This EUA will remain in effect (meaning this test can be used) for the duration of the COVID-19 declaration under Section 564(b)(1) of the Act, 21 U.S.C. section 360bbb-3(b)(1), unless the authorization is terminated or revoked sooner.  Performed at [facility], [address] [place], [place] [address]          Radiology Studies: [facility] Chest [place] 1 View  Result Date: 01/07/2021 CLINICAL DATA:  Altered mental status. EXAM: PORTABLE CHEST 1 VIEW COMPARISON:  Chest x-ray 01/17/2018. FINDINGS: Mediastinum hilar structures normal. Low lung volumes with mild bibasilar atelectasis. No prominent pleural effusion. No pneumothorax. Heart size normal. Thoracic spine scoliosis and degenerative change. Surgical clips right upper quadrant. IMPRESSION: Low lung volumes with mild bibasilar atelectasis. Electronically Signed   By: Marcello Moores  Register   On: 01/07/2021 07:02        Scheduled Meds: . buPROPion  150 mg Oral Daily  . cholecalciferol  2,000 Units Oral Daily  . donepezil  10 mg Oral QHS  . enoxaparin (LOVENOX) injection  40 mg Subcutaneous Q24H  . lidocaine  1 patch Transdermal q AM  . mirtazapine  15 mg Oral QHS  . oyster calcium  500 mg of elemental calcium Oral TID   Continuous Infusions: . ceFEPime (MAXIPIME) IV Stopped (01/08/21 1103)  . dextrose 5 % and 0.45% NaCl 75 mL/hr at 01/08/21 1007  . metronidazole 500 mg (01/08/21 1400)  . vancomycin Stopped (01/08/21 1122)     LOS: 1 day    Time spent: 25  minutes    Sidney Ace, MD [facility] Pager [phone]  If 7PM-7AM, please contact night-coverage 01/08/2021, 2:04 PM

## 2021-01-09 ENCOUNTER — Encounter: Payer: Self-pay | Admitting: Internal Medicine

## 2021-01-09 DIAGNOSIS — F0281 Dementia in other diseases classified elsewhere with behavioral disturbance: Secondary | ICD-10-CM

## 2021-01-09 DIAGNOSIS — Z7189 Other specified counseling: Secondary | ICD-10-CM

## 2021-01-09 DIAGNOSIS — R6520 Severe sepsis without septic shock: Secondary | ICD-10-CM

## 2021-01-09 DIAGNOSIS — G301 Alzheimer's disease with late onset: Secondary | ICD-10-CM

## 2021-01-09 DIAGNOSIS — Z515 Encounter for palliative care: Secondary | ICD-10-CM

## 2021-01-09 DIAGNOSIS — A419 Sepsis, unspecified organism: Principal | ICD-10-CM

## 2021-01-09 LAB — CBC WITH DIFFERENTIAL/PLATELET
Abs Immature Granulocytes: 0.07 10*3/uL (ref 0.00–0.07)
Basophils Absolute: 0 10*3/uL (ref 0.0–0.1)
Basophils Relative: 0 %
Eosinophils Absolute: 0.1 10*3/uL (ref 0.0–0.5)
Eosinophils Relative: 1 %
HCT: 41 % (ref 36.0–46.0)
Hemoglobin: 12.5 g/dL (ref 12.0–15.0)
Immature Granulocytes: 1 %
Lymphocytes Relative: 21 %
Lymphs Abs: 2.2 10*3/uL (ref 0.7–4.0)
MCH: 31.6 pg (ref 26.0–34.0)
MCHC: 30.5 g/dL (ref 30.0–36.0)
MCV: 103.5 fL — ABNORMAL HIGH (ref 80.0–100.0)
Monocytes Absolute: 0.5 10*3/uL (ref 0.1–1.0)
Monocytes Relative: 5 %
Neutro Abs: 7.4 10*3/uL (ref 1.7–7.7)
Neutrophils Relative %: 72 %
Platelets: 97 10*3/uL — ABNORMAL LOW (ref 150–400)
RBC: 3.96 MIL/uL (ref 3.87–5.11)
RDW: 12.2 % (ref 11.5–15.5)
WBC: 10.3 10*3/uL (ref 4.0–10.5)
nRBC: 0 % (ref 0.0–0.2)

## 2021-01-09 LAB — COMPREHENSIVE METABOLIC PANEL
ALT: 63 U/L — ABNORMAL HIGH (ref 0–44)
AST: 37 U/L (ref 15–41)
Albumin: 2.6 g/dL — ABNORMAL LOW (ref 3.5–5.0)
Alkaline Phosphatase: 88 U/L (ref 38–126)
Anion gap: 8 (ref 5–15)
BUN: 26 mg/dL — ABNORMAL HIGH (ref 8–23)
CO2: 30 mmol/L (ref 22–32)
Calcium: 8.4 mg/dL — ABNORMAL LOW (ref 8.9–10.3)
Chloride: 121 mmol/L — ABNORMAL HIGH (ref 98–111)
Creatinine, Ser: 0.35 mg/dL — ABNORMAL LOW (ref 0.44–1.00)
GFR, Estimated: >60 mL/min (ref >60)
Glucose, Bld: 135 mg/dL — ABNORMAL HIGH (ref 70–99)
Potassium: 3 mmol/L — ABNORMAL LOW (ref 3.5–5.1)
Sodium: 159 mmol/L — ABNORMAL HIGH (ref 135–145)
Total Bilirubin: 0.6 mg/dL (ref 0.3–1.2)
Total Protein: 4.8 g/dL — ABNORMAL LOW (ref 6.5–8.1)

## 2021-01-09 LAB — BASIC METABOLIC PANEL
Anion gap: 10 (ref 5–15)
BUN: 20 mg/dL (ref 8–23)
CO2: 27 mmol/L (ref 22–32)
Calcium: 8.4 mg/dL — ABNORMAL LOW (ref 8.9–10.3)
Chloride: 115 mmol/L — ABNORMAL HIGH (ref 98–111)
Creatinine, Ser: 0.39 mg/dL — ABNORMAL LOW (ref 0.44–1.00)
GFR, Estimated: >60 mL/min (ref >60)
Glucose, Bld: 143 mg/dL — ABNORMAL HIGH (ref 70–99)
Potassium: 3.5 mmol/L (ref 3.5–5.1)
Sodium: 152 mmol/L — ABNORMAL HIGH (ref 135–145)

## 2021-01-09 LAB — URINE CULTURE: Culture: >=100000 — AB

## 2021-01-09 LAB — MRSA PCR SCREENING: MRSA by PCR: NEGATIVE

## 2021-01-09 LAB — LACTIC ACID, PLASMA: Lactic Acid, Venous: 1.5 mmol/L (ref 0.5–1.9)

## 2021-01-09 MED ORDER — POTASSIUM CHLORIDE 10 MEQ/100ML IV SOLN
10.0000 meq | Freq: Once | INTRAVENOUS | Status: AC
  Administered 2021-01-09: 10 meq via INTRAVENOUS
  Filled 2021-01-09: qty 100

## 2021-01-09 MED ORDER — POTASSIUM CHLORIDE 10 MEQ/100ML IV SOLN
10.0000 meq | INTRAVENOUS | Status: AC
  Administered 2021-01-09 (×4): 10 meq via INTRAVENOUS
  Filled 2021-01-09 (×2): qty 100

## 2021-01-09 MED ORDER — DEXTROSE 5 % IV SOLN: INTRAVENOUS | Status: AC

## 2021-01-09 MED ORDER — SODIUM CHLORIDE 0.9 % IV SOLN
1.0000 g | INTRAVENOUS | Status: DC
  Administered 2021-01-09 – 2021-01-11 (×3): 1 g via INTRAVENOUS
  Filled 2021-01-09: qty 1
  Filled 2021-01-09 (×2): qty 10
  Filled 2021-01-09: qty 1

## 2021-01-09 MED ORDER — ARTIFICIAL TEARS OPHTHALMIC OINT
TOPICAL_OINTMENT | Freq: Every day | OPHTHALMIC | Status: DC
  Filled 2021-01-09: qty 3.5

## 2021-01-09 NOTE — Care Management Important Message (Signed)
Important Message  Patient Details  Name: [name] MRN: [id number] Date of Birth: [birth year]   Medicare Important Message Given:  Yes     Shelbie Ammons, RN 01/09/2021, 2:51 PM

## 2021-01-09 NOTE — TOC Progression Note (Signed)
Transition of Care [facility]) - Progression Note    Patient Details  Name: [name] MRN: [id number] Date of Birth: 1934/08/24  Transition of Care [facility]) CM/SW [place], RN Phone Number: 01/09/2021, 3:35 PM  Clinical Narrative:   RNCM reached out to Sharon with [facility] to assess patient status prior to hospitalization. Tameka reports that patient was wheelchair bound prior to hospital.          Expected Discharge Plan and Services                                                 Social Determinants of Health (SDOH) Interventions    Readmission Risk Interventions No flowsheet data found.

## 2021-01-09 NOTE — Progress Notes (Signed)
Patient rested well throughout the night. Patient smiling and responding back to this nurse with nods and gestures this morning. Will continue to monitor.

## 2021-01-09 NOTE — Evaluation (Signed)
Clinical/Bedside Swallow Evaluation Patient Details  Name: [name] MRN: [id number] Date of Birth: 1934-07-06  Today's Date: 01/09/2021 Time: SLP Start Time (ACUTE ONLY): 1250 SLP Stop Time (ACUTE ONLY): 1350 SLP Time Calculation (min) (ACUTE ONLY): 60 min  Past Medical History:  Past Medical History:  Diagnosis Date  . Age-related osteoporosis with current pathological fracture with routine healing 01/23/2018  . Alzheimer's dementia, late onset ([place])   . Cataract cortical, senile   . Depression   . Diverticulosis 11/22/2008  . Dizziness   . Heart murmur   . Hemorrhoids   . History of colonic polyps 11/22/2008  . Hx of migraine headaches   . Hypertension   . Late onset Alzheimer's disease with behavioral disturbance ([place]) 01/23/2018  . Osteoarthritis   . Pelvic fracture ([place])   . Psoriasis, unspecified   . RBBB   . Thrombocytopenia (HCC)    mild  . Vitamin D deficiency disease    Past Surgical History:  Past Surgical History:  Procedure Laterality Date  . ABDOMINAL HYSTERECTOMY    . APPENDECTOMY    . CATARACT EXTRACTION EXTRACAPSULAR  2007   [facility]  ([place])  . CATARACT EXTRACTION, BILATERAL    . CHOLECYSTECTOMY    . COLONOSCOPY W/ POLYPECTOMY     06/2002, 11/22/2008 Nicolasa Ducking / [facility])  . INTRAMEDULLARY (IM) NAIL INTERTROCHANTERIC Right 01/18/2018   Procedure: INTRAMEDULLARY (IM) NAIL INTERTROCHANTRIC ([facility]);  Surgeon: Lovell Sheehan, MD;  Location: [facility] ORS;  Service: Orthopedics;  Laterality: Right;  . MASTOIDECTOMY     HPI:  Pt is a 87 y.o. female with medical history significant of late onset Alzheimer's Dementia with behavior disturbance, hypertension, depression, anxiety, right bundle blockade, thrombocytopenia, psoriasis, and other Medical issues who presents with fever, worsened mental status and diarrhea from [facility] facility. Patient has Dementia, worsening mental status, and unable to provide any medical history. Per her daughter, patient was noted to have fever  of 105.2 since night before w/ faoul-smelling Urine. She was put on oxygen "for comfort "several days ago. Patient also has diarrhea, does not seem to have chest pain, respiratory distress, cough, shortness breath or abdominal pain.  She if fed at her facility per daughter's report; feels her diet is a regular diet.  Pt wears Dentures(not present this admit).   Assessment / Plan / Recommendation Clinical Impression  Pt appears to present w/ Mild+ oropharyngeal phase dysphagia in light of Significantly declined Cognitive status; Alzheimer's Dementia impacting the Oral phase as well as missing Dentition (dentures are at her facility). These factors impact her overall awareness/engagement and safety during po tasks which increases risk for aspiration, choking. Pt's risk for aspiration, as well as concern for meeting nutritional needs fully, is present but can be reduced when following general aspiration precautions, given feeding assistance, and when using a Modified consistency diet. She required Min+ tactile/verbal/ visual cues for orientation to bolus presentation, and during feeding to support her during po trials. Pt consumed trials of ice chip, purees, and Cup/Pinched straw sips of thin liquids w/ No consistent, overt clinical s/s of aspiration noted; no decline in vocal quality during few verbalizations, and no decline in respiratory status during/post trials. A throat clear and cough were noted separately during trials of thin liquids despite following aspiration precautions. Suspect the Oral phase delay and oral holding impacted timing of engaging pharyngeal swallowing. Oral phase was c/b frequent oral holding of all trial consistencies w/ piecemeal swallowing occurring. Thin liquids were assessed using a pinched straw delivery to  limit bolus amount/size in mouth. Given Time, pt manipulated the boluses to engage pharyngeal swallowing then cleared orally -- checking for oral clearing utilized. OM Exam was  cursory/observation, but No unilateral weakness noted. D/t pt's declined Cognitive status and her risk for aspiration, recommend initiation of the dysphagia level 1(puree) w/ Thin liquids -- also d/t not having her Dentures present; aspiration precautions; reduce Distractions during meals. Pills Crushed in Puree for safer swallowing. Support w/ feeding at meals -- check for oral clearing during/post intake. NSG updated. ST services recommends follow w/ Palliative Care for [place]. ST services can follow for ongoing education w/ pt/family -- briefly discussed the use of Thickened liquids if increased coughing w/ thin liquids is noted and/or decline in pulmonary status occurs, per GOC. Largely suspect that pt's advanced Dementia could continue to hamper oral intake and upgrade of diet. Precautions posted in room and given to Dtr. NSG updated. Palliative Care to follow. SLP Visit Diagnosis: Dysphagia, oropharyngeal phase (R13.12) (Dementia)    Aspiration Risk  Mild aspiration risk;Risk for inadequate nutrition/hydration    Diet Recommendation  Pureed diet (missing Dentures) w/ gravies to moisten; Thin liquids. Strict aspiration precautions -- Pinching Straw to limit bolus size/amount in mouth; Full Feeding support at all meals. Reduce distractions.  Medication Administration: Crushed with puree (for safer swallowing)    Other  Recommendations Recommended Consults:  (Palliative Care following; Dietician) Oral Care Recommendations: Oral care BID;Oral care before and after PO;Staff/trained caregiver to provide oral care Other Recommendations:  (n/a currently)   Follow up Recommendations None      Frequency and Duration min 2x/week  1 week       Prognosis Prognosis for Safe Diet Advancement: Fair Barriers to Reach Goals: Cognitive deficits;Time post onset;Severity of deficits      Swallow Study   General Date of Onset: 01/07/21 HPI: Pt is a 87 y.o. female with medical history significant of late  onset Alzheimer's Dementia with behavior disturbance, hypertension, depression, anxiety, right bundle blockade, thrombocytopenia, psoriasis, and other Medical issues who presents with fever, worsened mental status and diarrhea from Memory Care facility. Patient has Dementia, worsening mental status, and unable to provide any medical history. Per her daughter, patient was noted to have fever of 105.2 since night before w/ faoul-smelling Urine. She was put on oxygen "for comfort "several days ago. Patient also has diarrhea, does not seem to have chest pain, respiratory distress, cough, shortness breath or abdominal pain.  She if fed at her facility per daughter's report; feels her diet is a regular diet.  Pt wears Dentures(not present this admit). Type of Study: Bedside Swallow Evaluation Previous Swallow Assessment: none Diet Prior to this Study: Regular;Thin liquids (cut for her) Temperature Spikes Noted: No (wbc 10.3) Respiratory Status: Nasal cannula (2L chronic) History of Recent Intubation: No Behavior/Cognition: Alert;Cooperative;Pleasant mood;Confused;Distractible;Requires cueing (baseline Dementia) Oral Cavity Assessment: Dry (min) Oral Care Completed by SLP: Yes Oral Cavity - Dentition: Edentulous (dentures at facility) Vision: Functional for self-feeding Self-Feeding Abilities: Needs assist;Needs set up;Total assist (held cup to drink) Patient Positioning: Upright in bed (needed full positioning support) Baseline Vocal Quality: Low vocal intensity Volitional Cough: Cognitively unable to elicit Volitional Swallow: Unable to elicit    Oral/Motor/Sensory Function Overall Oral Motor/Sensory Function: Within functional limits   Ice Chips Ice chips: Within functional limits Presentation: Spoon (fed; 5 trials)   Thin Liquid Thin Liquid: Impaired Presentation: Cup;Self Fed;Straw (fully supported; 4 trials via each) Oral Phase Impairments: Poor awareness of bolus Oral Phase Functional  Implications: Oral holding Pharyngeal  Phase Impairments: Throat Clearing - Delayed;Cough - Immediate (x2 each) Other Comments: piecemeal swallowing    Nectar Thick Nectar Thick Liquid: Not tested   Honey Thick Honey Thick Liquid: Not tested   Puree Puree: Impaired Presentation: Spoon (fed; 8+ trials) Oral Phase Impairments: Poor awareness of bolus Oral Phase Functional Implications: Oral holding Pharyngeal Phase Impairments:  (none)   Solid     Solid: Not tested Other Comments: does not have dentures present        Orinda Kenner, MS, CCC-SLP Speech Language Pathologist Rehab Services [phone] [facility] 01/09/2021,3:24 PM

## 2021-01-09 NOTE — Progress Notes (Signed)
PROGRESS NOTE    [name]  [id number] DOB: 1934/02/11 DOA: 01/07/2021 PCP: Caralyn Guile    Brief Narrative:  87 y.o. female with medical history significant of late onset Alzheimer's disease with behavior disturbance, hypertension, depression, anxiety, right bundle blockade, thrombocytopenia, psoriasis, who presents with fever, worsened mental status and diarrhea.  Patient has dementia, worsening mental status, not arousable, cannot provide any medical history.  Per her daughter, patient was noted to have fever of 105.2 since last night.  Patient also has diarrhea, does not seem to have chest pain, respiratory distress, cough, shortness breath or abdominal pain.  Not sure if patient has symptoms of UTI.  She slightly moves her extremities on painful stimuli.  No facial droop noted.  Per her daughter, at her normal baseline, patient could talk to family members, usually not oriented to time and place.  1/27: Daughter was present at bedside my evaluation this morning.  Reports that her mother is a little bit better this morning.  Opens eyes.  Attempting to speak though speech remains somewhat garbled.  1/28: Hemodynamically stable.  Mental status slowly improving.  Urine culture positive for E. coli.  Antibiotics de-escalate to ceftriaxone.   Assessment & Plan:   Principal Problem:   Severe sepsis ([place]) Active Problems:   Dementia with behavioral disturbance (HCC)   Acute metabolic encephalopathy   Depression with anxiety   UTI (urinary tract infection)   Hypernatremia   Elevated troponin   Abnormal LFTs   Diarrhea   Comatose ([place])   Sepsis ([place])  Severe sepsis:  Patient meets criteria for severe sepsis with leukocytosis, fever, tachypnea, tachycardia.   Lactic acid elevated at 2.7, 2.5.   Blood pressures are soft.    If patient deteriorates, then will consider comfort care. Urine cx pos for E coli, likely source of sepsis Sepsis physiology improving Plan: DC  broad-spectrum antibiotics Start Rocephin 1 g every 24 hours Continue intravenous fluids Monitor cultures and fever curve Defer imaging for now If patient deteriorates will discuss with daughter regarding transition to comfort measures  Hypernatremia Sodium remains elevated despite D5 half-normal saline Plan: DC D5 half-normal Start D5 water at 50 cc for 8 hours   Dementia with behavioral disturbance (HCC) -Donepezil  Acute metabolic encephalopathy and comatose::  Ammonia level 30.  Possibly due to severe sepsis Mental status improving -Defer imaging at this time -Frequent neuro check -Treat as sepsis as above  Depression with anxiety -Remeron  Possible UTI (urinary tract infection): -On broad antibiotics as above -Follow-up urine culture and blood culture  Elevated troponin: Troponin 50, 44, 34, likely due to demand ischemia secondary to severe sepsis -Trend troponin -Check A1c, FLP -Patient is on as needed aspirin for fever  Abnormal LFTs -Check hepatitis panel -Hold off ultrasound of right upper quadrant per her daughter  Diarrhea -IV fluid -Check C. difficile PCR and GI pathogen panel    DVT prophylaxis: Lovenox Code Status: DNR Family Communication: Daughter at bedside 1/28 Disposition Plan: Status is: Inpatient  Remains inpatient appropriate because:Inpatient level of care appropriate due to severity of illness   Dispo: The patient is from: SNF              Anticipated d/c is to: SNF              Anticipated d/c date is: 1 day              Patient currently is not medically stable to d/c.   Difficult  to place patient No  Severe sepsis.  Suspected urinary source.  Blood cultures no growth to date.  Encephalopathy appears to be improving.  Sodium level remains elevated.  Possible disposition back to skilled nursing facility in 24 to 48 hours if patient continues to improve.    Level of care: Med-Surg  Consultants:  None  Procedures:    None  Antimicrobials  Vancomycin  Cefepime  Flagyl   Subjective: Patient seen and examined.  Daughter at bedside.  Patient pleasant but minimally communicative.  Does smile and nod.  Hemodynamically stable.  Objective: Vitals:   01/08/21 2337 01/09/21 0519 01/09/21 0737 01/09/21 1056  BP: (!) 122/49 (!) 113/47 (!) 140/56 (!) 123/46  Pulse: 87 75 80 77  Resp: 18 18 18 18   Temp: 97.6 F (36.4 C) 98.3 F (36.8 C) (!) 97.5 F (36.4 C) (!) 97.5 F (36.4 C)  TempSrc:  Oral Axillary   SpO2: 96% 99% 100% 100%  Weight:      Height:        Intake/Output Summary (Last 24 hours) at 01/09/2021 1156 Last data filed at 01/09/2021 0544 Gross per 24 hour  Intake 2275.49 ml  Output 900 ml  Net 1375.49 ml   Filed Weights   01/07/21 0648  Weight: 59 kg    Examination:  General exam: No acute distress.  Appears frail Respiratory system: Poor respiratory effort.  Lungs clear.  Normal work of breathing Cardiovascular system: S1 & S2 heard, RRR. No JVD, murmurs, rubs, gallops or clicks. No pedal edema. Gastrointestinal system: Abdomen is nondistended, soft and nontender. No organomegaly or masses felt. Normal bowel sounds heard. Central nervous system: Unable to assess orientation.  No focal deficits Extremities: No edema, decreased power bilaterally Skin: Pale, scattered excoriations Psychiatry: Judgement and insight appear impaired. Mood & affect flattened.     Data Reviewed: I have personally reviewed following labs and imaging studies  CBC: Recent Labs  Lab 01/07/21 0649 01/08/21 0458 01/09/21 0437  WBC 16.4* 14.5* 10.3  NEUTROABS 11.9*  --  7.4  HGB 19.0* 14.2 12.5  HCT 62.2* 47.1* 41.0  MCV 105.4* 107.0* 103.5*  PLT 184 98* 97*   Basic Metabolic Panel: Recent Labs  Lab 01/07/21 0649 01/07/21 1148 01/07/21 1832 01/08/21 0458 01/09/21 0437  NA 160* 160* 161* 157* 159*  K 4.2 4.0 3.9 3.5 3.0*  CL 119* 120* 123* 121* 121*  CO2 28 30 24 27 30   GLUCOSE  119* 131* 104* 86 135*  BUN 66* 61* 47* 41* 26*  CREATININE 0.87 0.75 0.60 0.40* 0.35*  CALCIUM 9.3 9.1 8.9 8.5* 8.4*   GFR: Estimated Creatinine Clearance: 48.8 mL/min (A) (by C-G formula based on SCr of 0.35 mg/dL (L)). Liver Function Tests: Recent Labs  Lab 01/07/21 0649 01/08/21 0458 01/09/21 0437  AST 162* 81* 37  ALT 119* 103* 63*  ALKPHOS 87 87 88  BILITOT 1.9* 0.9 0.6  PROT 5.2* 5.1* 4.8*  ALBUMIN 2.8* 2.7* 2.6*   No results for input(s): LIPASE, AMYLASE in the last 168 hours. Recent Labs  Lab 01/07/21 1148  AMMONIA 30   Coagulation Profile: Recent Labs  Lab 01/07/21 1832  INR 1.2   Cardiac Enzymes: No results for input(s): CKTOTAL, CKMB, CKMBINDEX, TROPONINI in the last 168 hours. BNP (last 3 results) No results for input(s): PROBNP in the last 8760 hours. HbA1C: Recent Labs    01/08/21 0458  HGBA1C 5.3   CBG: Recent Labs  Lab 01/07/21 0652  GLUCAP 145*  Lipid Profile: Recent Labs    01/08/21 0458  CHOL 101  HDL 17*  LDLCALC 47  TRIG 184*  CHOLHDL 5.9   Thyroid Function Tests: Recent Labs    01/08/21 0458  TSH 0.307*   Anemia Panel: No results for input(s): VITAMINB12, FOLATE, FERRITIN, TIBC, IRON, RETICCTPCT in the last 72 hours. Sepsis Labs: Recent Labs  Lab 01/07/21 1517 01/07/21 1148 01/07/21 1832 01/09/21 0437  PROCALCITON  --   --  0.55  --   LATICACIDVEN 2.7* 2.5* 2.2* 1.5    Recent Results (from the past 240 hour(s))  Culture, blood (routine x 2)     Status: None (Preliminary result)   Collection Time: 01/07/21  6:49 AM   Specimen: BLOOD  Result Value Ref Range Status   Specimen Description BLOOD RIGHT HAND  Final   Special Requests   Final    BOTTLES DRAWN AEROBIC AND ANAEROBIC Blood Culture results may not be optimal due to an inadequate volume of blood received in culture bottles   Culture   Final    NO GROWTH 2 DAYS Performed at [facility], [address], [place], [place] [address]    Report  Status PENDING  Incomplete  Culture, blood (routine x 2)     Status: None (Preliminary result)   Collection Time: 01/07/21  6:49 AM   Specimen: BLOOD  Result Value Ref Range Status   Specimen Description BLOOD LEFT ANTECUBITAL  Final   Special Requests   Final    BOTTLES DRAWN AEROBIC AND ANAEROBIC Blood Culture results may not be optimal due to an inadequate volume of blood received in culture bottles   Culture   Final    NO GROWTH 2 DAYS Performed at [facility], [address], [place], [place] [address]    Report Status PENDING  Incomplete  Urine culture     Status: Abnormal   Collection Time: 01/07/21  6:49 AM   Specimen: Urine, Random  Result Value Ref Range Status   Specimen Description   Final    URINE, RANDOM Performed at [facility], [address] [place], [place] [address]    Special Requests   Final    NONE Performed at [facility], [address] [place], [place] [address]    Culture >=100,000 COLONIES/mL ESCHERICHIA COLI (A)  Final   Report Status 01/09/2021 FINAL  Final   Organism ID, Bacteria ESCHERICHIA COLI (A)  Final      Susceptibility   Escherichia coli - MIC*    AMPICILLIN >=32 RESISTANT Resistant     CEFAZOLIN <=4 SENSITIVE Sensitive     CEFEPIME <=0.12 SENSITIVE Sensitive     CEFTRIAXONE <=0.25 SENSITIVE Sensitive     CIPROFLOXACIN >=4 RESISTANT Resistant     GENTAMICIN <=1 SENSITIVE Sensitive     IMIPENEM <=0.25 SENSITIVE Sensitive     NITROFURANTOIN <=16 SENSITIVE Sensitive     TRIMETH/SULFA >=320 RESISTANT Resistant     AMPICILLIN/SULBACTAM >=32 RESISTANT Resistant     PIP/TAZO <=4 SENSITIVE Sensitive     * >=100,000 COLONIES/mL ESCHERICHIA COLI  SARS Coronavirus 2 by RT PCR (hospital order, performed in [place] hospital lab) Nasopharyngeal Nasopharyngeal Swab     Status: None   Collection Time: 01/07/21  6:49 AM   Specimen: Nasopharyngeal Swab  Result Value Ref Range Status   SARS Coronavirus 2 NEGATIVE  NEGATIVE Final    Comment: (NOTE) SARS-CoV-2 target nucleic acids are NOT DETECTED.  The SARS-CoV-2 RNA is generally detectable in  upper and lower respiratory specimens during the acute phase of infection. The lowest concentration of SARS-CoV-2 viral copies this assay can detect is 250 copies / mL. A negative result does not preclude SARS-CoV-2 infection and should not be used as the sole basis for treatment or other patient management decisions.  A negative result may occur with improper specimen collection / handling, submission of specimen other than nasopharyngeal swab, presence of viral mutation(s) within the areas targeted by this assay, and inadequate number of viral copies (<250 copies / mL). A negative result must be combined with clinical observations, patient history, and epidemiological information.  Fact Sheet for Patients:   StrictlyIdeas.no  Fact Sheet for Healthcare Providers: BankingDealers.co.za  This test is not yet approved or  cleared by the [place] FDA and has been authorized for detection and/or diagnosis of SARS-CoV-2 by FDA under an Emergency Use Authorization (EUA).  This EUA will remain in effect (meaning this test can be used) for the duration of the COVID-19 declaration under Section 564(b)(1) of the Act, 21 U.S.C. section 360bbb-3(b)(1), unless the authorization is terminated or revoked sooner.  Performed at [facility], [address] [place], [place] [address]   MRSA PCR Screening     Status: None   Collection Time: 01/09/21  5:27 AM   Specimen: Nasal Mucosa; Nasopharyngeal  Result Value Ref Range Status   MRSA by PCR NEGATIVE NEGATIVE Final    Comment:        The GeneXpert MRSA Assay (FDA approved for NASAL specimens only), is one component of a comprehensive MRSA colonization surveillance program. It is not intended to diagnose MRSA infection nor to guide or monitor  treatment for MRSA infections. Performed at [facility], [address], [place], [place] [address]          Radiology Studies: No results found.      Scheduled Meds: . buPROPion  150 mg Oral Daily  . cholecalciferol  2,000 Units Oral Daily  . donepezil  10 mg Oral QHS  . enoxaparin (LOVENOX) injection  40 mg Subcutaneous Q24H  . lidocaine  1 patch Transdermal q AM  . mirtazapine  15 mg Oral QHS  . oyster calcium  500 mg of elemental calcium Oral TID   Continuous Infusions: . cefTRIAXone (ROCEPHIN)  IV    . dextrose 50 mL/hr at 01/09/21 1043  . potassium chloride 10 mEq (01/09/21 1042)     LOS: 2 days    Time spent: 25 minutes    Sidney Ace, MD [facility] Pager [phone]  If 7PM-7AM, please contact night-coverage 01/09/2021, 11:56 AM

## 2021-01-09 NOTE — Consult Note (Signed)
                                                                                 Consultation Note Date: 01/09/2021   Patient Name: [name]  DOB: 08/31/1934  MRN: [id number]  Age / Sex: 87 y.o., female  PCP: Shore, April C, PA-Carter Referring Physician: Sreenath, April B, MD  Reason for Consultation: Establishing goals of care and Psychosocial/spiritual support  HPI/Patient Profile: 87 y.o. female  with past medical history of dementia, HTN, depression/anxiety, right BBB, thrombocytopenia, psoriasis, history of colonic polyps/hemorrhoids, osteoarthritis, diverticulosis, cataract surgery admitted on 01/07/2021 with severe sepsis/UTI, hypernatremia.   Clinical Assessment and Goals of Care: I have reviewed medical records including EPIC notes, labs and imaging, received report from TOC team, examined the patient and met at bedside with daughter/HC POA, [name] to discuss diagnosis prognosis, GOC, EOL wishes, disposition and options.   I introduced Palliative Medicine as specialized medical care for people living with serious illness. It focuses on providing relief from the symptoms and stress of a serious illness.   We discussed a brief life review of the patient.  Mrs. [name] had 3 children, her 2 sons are deceased.  She has been a resident of [facility] for the last 3 to 4 years.  She initially spent 3 to 4 months in an ALF, that was relatively quickly transitioned to memory care.  As far as functional and nutritional status, per notes, Mrs. [name] is wheelchair-bound at baseline.  We talked about her UTI, sepsis, hypernatremia.  We talked about the treatment plan including, but not limited to IV fluids, antibiotics, selected labs.  We discussed her current illness and what it means in the larger context of her on-going co-morbidities.  Natural disease trajectory and expectations at EOL were discussed.   April and I talked about "meaningful improvements".  I share that Mrs. [name] may not  be able to have meaningful recovery during this hospital stay.  We talked about time for outcomes.    We talked about the concept of "treat the treatable, but allowing natural death"/DNR.  We also talked about the concept of "let nature take its course".  I give an example of when this would be appropriate.  I share a diagram of the chronic illness pathway, what is normal and expected, also sharing the changes that occur for people who have memory loss..  We talked about how to make choices for loved ones including 1) keeping them at the center of decision-making, not what we want for them 2) are we doing something for them or to them, can we change what is happening 3) the person Mrs. [name] was 10 years ago, how would that person tell April to care for her now.  April quickly states, "she would tell me to let her go.  We talked about what if Mrs. [name] is able to recover.  I shared that she will likely get back to [facility], memory care unit if she is accepted.   At this point April is agreeable to outpatient palliative services to follow to consider the "what if's and maybe's".  I share that just like I will   get sick again, so we will Mrs. [name].  We talked about what this time would look like and feel like for Mrs. [name], again focusing on how to make choices for loved ones.  Hospice and Palliative Care services outpatient were explained and offered.  [name] is agreeable to outpatient palliative services to follow.  We talked about transitioning to hospice care when appropriate.   If [name] worsens during this hospital stay, [name] is open to comfort and dignity, residential hospice care.  Questions and concerns were addressed.  The family was encouraged to call with questions or concerns.   Conference with attending, bedside nursing staff, transition of care team related to patient condition, needs, goals of care.  HCPOA   NEXT OF KIN -daughter, [name].  [name] states that Mrs.  [name] had two sons, but both are deceased.  Her youngest son died in 12/04/2020.    SUMMARY OF RECOMMENDATIONS   At this point continue to treat the treatable but no CPR or intubation.  No extraordinary measures. Time for outcomes. [name] shares that if she is not seeing meaningful improvements for Mrs. [name] in the next few days she would consider hospice care.   Code Status/Advance Care Planning:  DNR  Symptom Management:   Per hospitalist, no additional needs at this time.  Palliative Prophylaxis:   Frequent Pain Assessment, Oral Care and Turn Reposition  Additional Recommendations (Limitations, Scope, Preferences):  Treat the treatable but no CPR or intubation.  Considering hospice care  Psycho-social/Spiritual:   Desire for further Chaplaincy support:no  Additional Recommendations: Caregiving  Support/Resources and Education on Hospice  Prognosis:   Unable to determine, based on outcomes.  If Mrs. [name] does not have meaningful improvement during his hospital stay, daughter is considering focusing on comfort and dignity, let nature take its course.  At that point, weeks would be expected.  Discharge Planning: To be determined, based on outcomes.      Primary Diagnoses: Present on Admission: . Acute metabolic encephalopathy . Severe sepsis ([place]) . Dementia with behavioral disturbance ([place]) . Depression with anxiety . UTI (urinary tract infection) . Hypernatremia . Elevated troponin . Abnormal LFTs . Diarrhea . Comatose ([place]) . Sepsis ([place])   I have reviewed the medical record, interviewed the patient and family, and examined the patient. The following aspects are pertinent.  Past Medical History:  Diagnosis Date  . Age-related osteoporosis with current pathological fracture with routine healing 01/23/2018  . Alzheimer's dementia, late onset ([place])   . Cataract cortical, senile   . Depression   . Diverticulosis 11/22/2008  . Dizziness   . Heart  murmur   . Hemorrhoids   . History of colonic polyps 11/22/2008  . Hx of migraine headaches   . Hypertension   . Late onset Alzheimer's disease with behavioral disturbance ([place]) 01/23/2018  . Osteoarthritis   . Pelvic fracture ([place])   . Psoriasis, unspecified   . RBBB   . Thrombocytopenia (HCC)    mild  . Vitamin D deficiency disease    Social History   Socioeconomic History  . Marital status: Widowed    Spouse name: Not on file  . Number of children: 3  . Years of education: 77  . Highest education level: High school graduate  Occupational History  . Occupation: retired    Comment: Dance movement psychotherapist  Tobacco Use  . Smoking status: Former Smoker    Packs/day: 0.50    Years: 15.00    Pack years: 7.50  Types: Cigarettes    Quit date: 12/14/2007    Years since quitting: 13.0  . Smokeless tobacco: Never Used  Vaping Use  . Vaping Use: Never used  Substance and Sexual Activity  . Alcohol use: No    Alcohol/week: 0.0 standard drinks  . Drug use: No  . Sexual activity: Not on file  Other Topics Concern  . Not on file  Social History Narrative  . Not on file   Social Determinants of Health   Financial Resource Strain: Not on file  Food Insecurity: Not on file  Transportation Needs: Not on file  Physical Activity: Not on file  Stress: Not on file  Social Connections: Not on file   Family History  Problem Relation Age of Onset  . Arthritis Mother   . Mental illness Mother   . Stroke Mother   . Arthritis Father   . Atrial fibrillation Sister    Scheduled Meds: . artificial tears   Both Eyes QHS  . buPROPion  150 mg Oral Daily  . cholecalciferol  2,000 Units Oral Daily  . donepezil  10 mg Oral QHS  . enoxaparin (LOVENOX) injection  40 mg Subcutaneous Q24H  . lidocaine  1 patch Transdermal q AM  . mirtazapine  15 mg Oral QHS  . oyster calcium  500 mg of elemental calcium Oral TID   Continuous Infusions: . cefTRIAXone (ROCEPHIN)  IV 1 g (01/09/21 1414)  .  dextrose 50 mL/hr at 01/09/21 1043   PRN Meds:.aspirin, ibuprofen, ondansetron Medications Prior to Admission:  Prior to Admission medications   Medication Sig Start Date End Date Taking? Authorizing Provider  acetaminophen (TYLENOL) 325 MG tablet Take 650 mg by mouth 4 (four) times daily.    Yes [provider]  buPROPion (WELLBUTRIN XL) 150 MG 24 hr tablet Take 150 mg by mouth daily.   Yes [provider]  Cholecalciferol (VITAMIN D) 50 MCG (2000 UT) tablet Take 2,000 Units by mouth daily.   Yes [provider]  donepezil (ARICEPT) 10 MG tablet Take 1 tablet (10 mg total) by mouth at bedtime. 11/29/16  Yes Tat, Eustace Quail, DO  Lidocaine 4 % PTCH Apply 1 patch topically in the morning. Apply 1 patch to lower back in the morning and remove at bedtime   Yes [provider]  mirtazapine (REMERON) 15 MG tablet Take 15 mg by mouth at bedtime.   Yes [provider]  [place] (OYSTER CALCIUM) 500 MG TABS tablet Take 500 mg of elemental calcium by mouth 3 (three) times daily.   Yes [provider]   No Known Allergies Review of Systems  Unable to perform ROS: Dementia    Physical Exam Vitals and nursing note reviewed.  Constitutional:      General: She is not in acute distress.    Appearance: She is ill-appearing.  HENT:     Head: Normocephalic and atraumatic.  Cardiovascular:     Rate and Rhythm: Normal rate.  Pulmonary:     Effort: Pulmonary effort is normal. No respiratory distress.  Skin:    General: Skin is warm and dry.  Neurological:     Comments: Known dementia  Psychiatric:     Comments: Calm and cooperative     Vital Signs: BP (!) 123/46 (BP Location: Right Arm)   Pulse 77   Temp (!) 97.5 F (36.4 Carter)   Resp 18   Ht 5' 7" (1.702 m)   Wt 59 kg   SpO2 100%  BMI 20.36 kg/m  Pain Scale: 0-10   Pain Score: 0-No pain   SpO2: SpO2: 100 % O2 Device:SpO2: 100 % O2 Flow Rate: .O2 Flow Rate (L/min): 2  L/min  IO: Intake/output summary:   Intake/Output Summary (Last 24 hours) at 01/09/2021 1539 Last data filed at 01/09/2021 0544 Gross per 24 hour  Intake 2275.49 ml  Output 100 ml  Net 2175.49 ml    LBM: Last BM Date: 01/08/21 Baseline Weight: Weight: 59 kg Most recent weight: Weight: 59 kg     Palliative Assessment/Data:   Flowsheet Rows   Flowsheet Row Most Recent Value  Intake Tab   Referral Department Hospitalist  Unit at Time of Referral Med/Surg Unit  Palliative Care Primary Diagnosis Sepsis/Infectious Disease  Date Notified 01/09/21  Palliative Care Type New Palliative care  Reason for referral Clarify Goals of Care  Date of Admission 01/07/21  Date first seen by Palliative Care 01/09/21  # of days Palliative referral response time 0 Day(s)  # of days IP prior to Palliative referral 2  Clinical Assessment   Palliative Performance Scale Score 30%  Pain Max last 24 hours Not able to report  Pain Min Last 24 hours Not able to report  Dyspnea Max Last 24 Hours Not able to report  Dyspnea Min Last 24 hours Not able to report  Psychosocial & Spiritual Assessment   Palliative Care Outcomes       Time In: 1440 Time Out: 1550 Time Total: 70 minutes  Greater than 50%  of this time was spent counseling and coordinating care related to the above assessment and plan.  Signed by: Drue Novel, NP   Please contact Palliative Medicine Team phone at [phone] for questions and concerns.  For individual provider: See Shea Evans

## 2021-01-10 LAB — CBC WITH DIFFERENTIAL/PLATELET
Abs Immature Granulocytes: 0.11 10*3/uL — ABNORMAL HIGH (ref 0.00–0.07)
Basophils Absolute: 0 10*3/uL (ref 0.0–0.1)
Basophils Relative: 0 %
Eosinophils Absolute: 0.1 10*3/uL (ref 0.0–0.5)
Eosinophils Relative: 1 %
HCT: 38.8 % (ref 36.0–46.0)
Hemoglobin: 12.5 g/dL (ref 12.0–15.0)
Immature Granulocytes: 2 %
Lymphocytes Relative: 27 %
Lymphs Abs: 2 10*3/uL (ref 0.7–4.0)
MCH: 32.6 pg (ref 26.0–34.0)
MCHC: 32.2 g/dL (ref 30.0–36.0)
MCV: 101.3 fL — ABNORMAL HIGH (ref 80.0–100.0)
Monocytes Absolute: 0.5 10*3/uL (ref 0.1–1.0)
Monocytes Relative: 7 %
Neutro Abs: 4.8 10*3/uL (ref 1.7–7.7)
Neutrophils Relative %: 63 %
Platelets: 87 10*3/uL — ABNORMAL LOW (ref 150–400)
RBC: 3.83 MIL/uL — ABNORMAL LOW (ref 3.87–5.11)
RDW: 12.5 % (ref 11.5–15.5)
WBC: 7.6 10*3/uL (ref 4.0–10.5)
nRBC: 0 % (ref 0.0–0.2)

## 2021-01-10 LAB — COMPREHENSIVE METABOLIC PANEL
ALT: 43 U/L (ref 0–44)
AST: 27 U/L (ref 15–41)
Albumin: 2.4 g/dL — ABNORMAL LOW (ref 3.5–5.0)
Alkaline Phosphatase: 79 U/L (ref 38–126)
Anion gap: 6 (ref 5–15)
BUN: 16 mg/dL (ref 8–23)
CO2: 30 mmol/L (ref 22–32)
Calcium: 8.8 mg/dL — ABNORMAL LOW (ref 8.9–10.3)
Chloride: 120 mmol/L — ABNORMAL HIGH (ref 98–111)
Creatinine, Ser: 0.35 mg/dL — ABNORMAL LOW (ref 0.44–1.00)
GFR, Estimated: >60 mL/min (ref >60)
Glucose, Bld: 102 mg/dL — ABNORMAL HIGH (ref 70–99)
Potassium: 3.8 mmol/L (ref 3.5–5.1)
Sodium: 156 mmol/L — ABNORMAL HIGH (ref 135–145)
Total Bilirubin: 0.4 mg/dL (ref 0.3–1.2)
Total Protein: 4.6 g/dL — ABNORMAL LOW (ref 6.5–8.1)

## 2021-01-10 LAB — GLUCOSE, CAPILLARY
Glucose-Capillary: 105 mg/dL — ABNORMAL HIGH (ref 70–99)
Glucose-Capillary: 156 mg/dL — ABNORMAL HIGH (ref 70–99)

## 2021-01-10 MED ORDER — IPRATROPIUM-ALBUTEROL 0.5-2.5 (3) MG/3ML IN SOLN: 3.0000 mL | RESPIRATORY_TRACT | Status: DC | PRN

## 2021-01-10 MED ORDER — DEXTROSE 5 % IV SOLN: INTRAVENOUS | Status: AC

## 2021-01-10 MED ORDER — GUAIFENESIN ER 600 MG PO TB12
600.0000 mg | ORAL_TABLET | Freq: Two times a day (BID) | ORAL | Status: DC
Start: 2021-01-10 — End: 2021-01-12
  Administered 2021-01-10 – 2021-01-11 (×4): 600 mg via ORAL
  Filled 2021-01-10 (×4): qty 1

## 2021-01-10 NOTE — Progress Notes (Signed)
PROGRESS NOTE    [name]  ESP:[id number] DOB: March 30, 1934 DOA: 01/07/2021 PCP: Caralyn Guile    Brief Narrative:  87 y.o. female with medical history significant of late onset Alzheimer's disease with behavior disturbance, hypertension, depression, anxiety, right bundle blockade, thrombocytopenia, psoriasis, who presents with fever, worsened mental status and diarrhea.  Patient has dementia, worsening mental status, not arousable, cannot provide any medical history.  Per her daughter, patient was noted to have fever of 105.2 since last night.  Patient also has diarrhea, does not seem to have chest pain, respiratory distress, cough, shortness breath or abdominal pain.  Not sure if patient has symptoms of UTI.  She slightly moves her extremities on painful stimuli.  No facial droop noted.  Per her daughter, at her normal baseline, patient could talk to family members, usually not oriented to time and place.  1/27: Daughter was present at bedside my evaluation this morning.  Reports that her mother is a little bit better this morning.  Opens eyes.  Attempting to speak though speech remains somewhat garbled.  1/28: Hemodynamically stable.  Mental status slowly improving.  Urine culture positive for E. coli.  Antibiotics de-escalate to ceftriaxone.  1/29: No status changes.  Remains hemodynamically stable.   oral intake remains poor.  Daughter met with palliative care yesterday.  Plan to monitor patient over the weekend and if no appreciable improvement by Monday we will consider transition to hospice care.   Assessment & Plan:   Principal Problem:   Severe sepsis ([place]) Active Problems:   Dementia with behavioral disturbance (H[facility])   Acute metabolic encephalopathy   Depression with anxiety   UTI (urinary tract infection)   Hypernatremia   Elevated troponin   Abnormal LFTs   Diarrhea   Comatose ([place])   Sepsis ([place])  Severe sepsis secondary to UTI Patient meets criteria for  severe sepsis with leukocytosis, fever, tachypnea, tachycardia.   Lactic acid elevated at 2.7, 2.5.   Blood pressures are improving Urine cx pos for E coli, likely source of sepsis Sepsis physiology improving Plan: Continue Rocephin 1 g every 24 hours while admitted Limits antibiotic course to 5 to 7 days Continue intravenous fluids Monitor cultures and fever curve Defer imaging for now Patient's daughter has met with palliative care.  Plan to monitor patient over weekend.  If no appreciable improvement by Monday will consider transition to hospice/comfort  Hypernatremia Sodium remains elevated P.o. fluid intake remains poor Plan: On D5 water at 50cc for 10 hours   Dementia with behavioral disturbance (HCC) -Donepezil  Acute metabolic encephalopathy and comatose::  Ammonia level 30.  Possibly due to severe sepsis Mental status improving Not quite at baseline Remains minimally communicative though pleasant and smiles -Defer imaging at this time -Frequent neuro check -Treat as sepsis as above -Possible transition to hospice/comfort measures on Monday if patient does not appear to be improving  Depression with anxiety -Remeron  Elevated troponin: Troponin 50, 44, 34, likely due to demand ischemia secondary to severe sepsis -Trend troponin -Check A1c, FLP -Patient is on as needed aspirin for fever  Abnormal LFTs -Check hepatitis panel -Hold off ultrasound of right upper quadrant per her daughter  Diarrhea, resolved    DVT prophylaxis: Lovenox Code Status: DNR Family Communication: Daughter at bedside 1/29 Disposition Plan: Status is: Inpatient  Remains inpatient appropriate because:Inpatient level of care appropriate due to severity of illness   Dispo: The patient is from: SNF  Anticipated d/c is to: SNF              Anticipated d/c date is: 1 day              Patient currently is not medically stable to d/c.   Difficult to place patient  No  Severe sepsis improving.  Acute metabolic encephalopathy is persistent.  We will plan to monitor over the weekend.  If by Monday no appreciable improvement daughter will consider transition to hospice/comfort measures.    Level of care: Med-Surg  Consultants:  None  Procedures:   None  Antimicrobials  Vancomycin  Cefepime  Flagyl   Subjective: Patient seen and examined.  Daughter at bedside.  Patient pleasant and smiles but minimally communicative.  Objective: Vitals:   01/09/21 2127 01/10/21 0009 01/10/21 0532 01/10/21 0812  BP: (!) 123/37 102/72 (!) 127/53 (!) 106/52  Pulse: 88 95 76 74  Resp: 18 18 15 16   Temp: 98.3 F (36.8 C) 97.7 F (36.5 C) (!) 97.5 F (36.4 C) 97.6 F (36.4 C)  TempSrc:      SpO2: 100% 98% 95% 98%  Weight:      Height:        Intake/Output Summary (Last 24 hours) at 01/10/2021 1018 Last data filed at 01/10/2021 0529 Gross per 24 hour  Intake 0 ml  Output 700 ml  Net -700 ml   Filed Weights   01/07/21 0648  Weight: 59 kg    Examination:  General exam: No acute distress.  Appears frail Respiratory system: Poor respiratory effort.  Lungs clear.  Normal work of breathing Cardiovascular system: S1 & S2 heard, RRR. No JVD, murmurs, rubs, gallops or clicks. No pedal edema. Gastrointestinal system: Abdomen is nondistended, soft and nontender. No organomegaly or masses felt. Normal bowel sounds heard. Central nervous system: Unable to assess orientation.  No focal deficits Extremities: No edema, decreased power bilaterally Skin: Pale, scattered excoriations Psychiatry: Judgement and insight appear impaired. Mood & affect flattened.     Data Reviewed: I have personally reviewed following labs and imaging studies  CBC: Recent Labs  Lab 01/07/21 0649 01/08/21 0458 01/09/21 0437 01/10/21 0507  WBC 16.4* 14.5* 10.3 7.6  NEUTROABS 11.9*  --  7.4 4.8  HGB 19.0* 14.2 12.5 12.5  HCT 62.2* 47.1* 41.0 38.8  MCV 105.4* 107.0*  103.5* 101.3*  PLT 184 98* 97* 87*   Basic Metabolic Panel: Recent Labs  Lab 01/07/21 1832 01/08/21 0458 01/09/21 0437 01/09/21 1456 01/10/21 0507  NA 161* 157* 159* 152* 156*  K 3.9 3.5 3.0* 3.5 3.8  CL 123* 121* 121* 115* 120*  CO2 24 27 30 27 30   GLUCOSE 104* 86 135* 143* 102*  BUN 47* 41* 26* 20 16  CREATININE 0.60 0.40* 0.35* 0.39* 0.35*  CALCIUM 8.9 8.5* 8.4* 8.4* 8.8*   GFR: Estimated Creatinine Clearance: 48.8 mL/min (A) (by C-G formula based on SCr of 0.35 mg/dL (L)). Liver Function Tests: Recent Labs  Lab 01/07/21 0649 01/08/21 0458 01/09/21 0437 01/10/21 0507  AST 162* 81* 37 27  ALT 119* 103* 63* 43  ALKPHOS 87 87 88 79  BILITOT 1.9* 0.9 0.6 0.4  PROT 5.2* 5.1* 4.8* 4.6*  ALBUMIN 2.8* 2.7* 2.6* 2.4*   No results for input(s): LIPASE, AMYLASE in the last 168 hours. Recent Labs  Lab 01/07/21 1148  AMMONIA 30   Coagulation Profile: Recent Labs  Lab 01/07/21 1832  INR 1.2   Cardiac Enzymes: No results for input(s): CKTOTAL, CKMB,  CKMBINDEX, TROPONINI in the last 168 hours. BNP (last 3 results) No results for input(s): PROBNP in the last 8760 hours. HbA1C: Recent Labs    01/08/21 0458  HGBA1C 5.3   CBG: Recent Labs  Lab 01/07/21 0652 01/10/21 0815  GLUCAP 145* 105*   Lipid Profile: Recent Labs    01/08/21 0458  CHOL 101  HDL 17*  LDLCALC 47  TRIG 184*  CHOLHDL 5.9   Thyroid Function Tests: Recent Labs    01/08/21 0458  TSH 0.307*   Anemia Panel: No results for input(s): VITAMINB12, FOLATE, FERRITIN, TIBC, IRON, RETICCTPCT in the last 72 hours. Sepsis Labs: Recent Labs  Lab 01/07/21 5465 01/07/21 1148 01/07/21 1832 01/09/21 0437  PROCALCITON  --   --  0.55  --   LATICACIDVEN 2.7* 2.5* 2.2* 1.5    Recent Results (from the past 240 hour(s))  Culture, blood (routine x 2)     Status: None (Preliminary result)   Collection Time: 01/07/21  6:49 AM   Specimen: BLOOD  Result Value Ref Range Status   Specimen Description  BLOOD RIGHT HAND  Final   Special Requests   Final    BOTTLES DRAWN AEROBIC AND ANAEROBIC Blood Culture results may not be optimal due to an inadequate volume of blood received in culture bottles   Culture   Final    NO GROWTH 3 DAYS Performed at [facility], [address], [place], [place] [address]    Report Status PENDING  Incomplete  Culture, blood (routine x 2)     Status: None (Preliminary result)   Collection Time: 01/07/21  6:49 AM   Specimen: BLOOD  Result Value Ref Range Status   Specimen Description BLOOD LEFT ANTECUBITAL  Final   Special Requests   Final    BOTTLES DRAWN AEROBIC AND ANAEROBIC Blood Culture results may not be optimal due to an inadequate volume of blood received in culture bottles   Culture   Final    NO GROWTH 3 DAYS Performed at [facility], [address], [place], [place] [address]    Report Status PENDING  Incomplete  Urine culture     Status: Abnormal   Collection Time: 01/07/21  6:49 AM   Specimen: Urine, Random  Result Value Ref Range Status   Specimen Description   Final    URINE, RANDOM Performed at [facility], [address] [place], [place] [address]    Special Requests   Final    NONE Performed at [facility], [address] [place], [place] [address]    Culture >=100,000 COLONIES/mL ESCHERICHIA COLI (A)  Final   Report Status 01/09/2021 FINAL  Final   Organism ID, Bacteria ESCHERICHIA COLI (A)  Final      Susceptibility   Escherichia coli - MIC*    AMPICILLIN >=32 RESISTANT Resistant     CEFAZOLIN <=4 SENSITIVE Sensitive     CEFEPIME <=0.12 SENSITIVE Sensitive     CEFTRIAXONE <=0.25 SENSITIVE Sensitive     CIPROFLOXACIN >=4 RESISTANT Resistant     GENTAMICIN <=1 SENSITIVE Sensitive     IMIPENEM <=0.25 SENSITIVE Sensitive     NITROFURANTOIN <=16 SENSITIVE Sensitive     TRIMETH/SULFA >=320 RESISTANT Resistant     AMPICILLIN/SULBACTAM >=32 RESISTANT Resistant     PIP/TAZO <=4  SENSITIVE Sensitive     * >=100,000 COLONIES/mL ESCHERICHIA COLI  SARS Coronavirus 2 by RT PCR (hospital order, performed in [facility] hospital lab) Nasopharyngeal Nasopharyngeal Swab  Status: None   Collection Time: 01/07/21  6:49 AM   Specimen: Nasopharyngeal Swab  Result Value Ref Range Status   SARS Coronavirus 2 NEGATIVE NEGATIVE Final    Comment: (NOTE) SARS-CoV-2 target nucleic acids are NOT DETECTED.  The SARS-CoV-2 RNA is generally detectable in upper and lower respiratory specimens during the acute phase of infection. The lowest concentration of SARS-CoV-2 viral copies this assay can detect is 250 copies / mL. A negative result does not preclude SARS-CoV-2 infection and should not be used as the sole basis for treatment or other patient management decisions.  A negative result may occur with improper specimen collection / handling, submission of specimen other than nasopharyngeal swab, presence of viral mutation(s) within the areas targeted by this assay, and inadequate number of viral copies (<250 copies / mL). A negative result must be combined with clinical observations, patient history, and epidemiological information.  Fact Sheet for Patients:   StrictlyIdeas.no  Fact Sheet for Healthcare Providers: BankingDealers.co.za  This test is not yet approved or  cleared by the [place] FDA and has been authorized for detection and/or diagnosis of SARS-CoV-2 by FDA under an Emergency Use Authorization (EUA).  This EUA will remain in effect (meaning this test can be used) for the duration of the COVID-19 declaration under Section 564(b)(1) of the Act, 21 U.S.C. section 360bbb-3(b)(1), unless the authorization is terminated or revoked sooner.  Performed at [facility], [address] [place], [place] [address]   MRSA PCR Screening     Status: None   Collection Time: 01/09/21  5:27 AM   Specimen: Nasal  Mucosa; Nasopharyngeal  Result Value Ref Range Status   MRSA by PCR NEGATIVE NEGATIVE Final    Comment:        The GeneXpert MRSA Assay (FDA approved for NASAL specimens only), is one component of a comprehensive MRSA colonization surveillance program. It is not intended to diagnose MRSA infection nor to guide or monitor treatment for MRSA infections. Performed at [facility], [address], [place], [place] [address]          Radiology Studies: No results found.      Scheduled Meds: . artificial tears   Both Eyes QHS  . buPROPion  150 mg Oral Daily  . cholecalciferol  2,000 Units Oral Daily  . donepezil  10 mg Oral QHS  . enoxaparin (LOVENOX) injection  40 mg Subcutaneous Q24H  . lidocaine  1 patch Transdermal q AM  . mirtazapine  15 mg Oral QHS  . oyster calcium  500 mg of elemental calcium Oral TID   Continuous Infusions: . cefTRIAXone (ROCEPHIN)  IV 1 g (01/10/21 0826)  . dextrose 50 mL/hr at 01/10/21 1410     LOS: 3 days    Time spent: 25 minutes    Sidney Ace, MD [facility] Pager [phone]  If 7PM-7AM, please contact night-coverage 01/10/2021, 10:18 AM

## 2021-01-11 LAB — CBC WITH DIFFERENTIAL/PLATELET
Abs Immature Granulocytes: 0.12 10*3/uL — ABNORMAL HIGH (ref 0.00–0.07)
Basophils Absolute: 0.1 10*3/uL (ref 0.0–0.1)
Basophils Relative: 1 %
Eosinophils Absolute: 0.1 10*3/uL (ref 0.0–0.5)
Eosinophils Relative: 1 %
HCT: 41.4 % (ref 36.0–46.0)
Hemoglobin: 12.6 g/dL (ref 12.0–15.0)
Immature Granulocytes: 1 %
Lymphocytes Relative: 23 %
Lymphs Abs: 2.2 10*3/uL (ref 0.7–4.0)
MCH: 31.8 pg (ref 26.0–34.0)
MCHC: 30.4 g/dL (ref 30.0–36.0)
MCV: 104.5 fL — ABNORMAL HIGH (ref 80.0–100.0)
Monocytes Absolute: 0.7 10*3/uL (ref 0.1–1.0)
Monocytes Relative: 8 %
Neutro Abs: 6.2 10*3/uL (ref 1.7–7.7)
Neutrophils Relative %: 66 %
Platelets: 105 10*3/uL — ABNORMAL LOW (ref 150–400)
RBC: 3.96 MIL/uL (ref 3.87–5.11)
RDW: 12.3 % (ref 11.5–15.5)
WBC: 9.4 10*3/uL (ref 4.0–10.5)
nRBC: 0 % (ref 0.0–0.2)

## 2021-01-11 LAB — COMPREHENSIVE METABOLIC PANEL
ALT: 37 U/L (ref 0–44)
AST: 32 U/L (ref 15–41)
Albumin: 2.5 g/dL — ABNORMAL LOW (ref 3.5–5.0)
Alkaline Phosphatase: 89 U/L (ref 38–126)
Anion gap: 7 (ref 5–15)
BUN: 14 mg/dL (ref 8–23)
CO2: 32 mmol/L (ref 22–32)
Calcium: 8.5 mg/dL — ABNORMAL LOW (ref 8.9–10.3)
Chloride: 110 mmol/L (ref 98–111)
Creatinine, Ser: 0.33 mg/dL — ABNORMAL LOW (ref 0.44–1.00)
GFR, Estimated: >60 mL/min (ref >60)
Glucose, Bld: 155 mg/dL — ABNORMAL HIGH (ref 70–99)
Potassium: 3.4 mmol/L — ABNORMAL LOW (ref 3.5–5.1)
Sodium: 149 mmol/L — ABNORMAL HIGH (ref 135–145)
Total Bilirubin: 0.4 mg/dL (ref 0.3–1.2)
Total Protein: 4.8 g/dL — ABNORMAL LOW (ref 6.5–8.1)

## 2021-01-11 LAB — GLUCOSE, CAPILLARY: Glucose-Capillary: 136 mg/dL — ABNORMAL HIGH (ref 70–99)

## 2021-01-11 MED ORDER — DEXTROSE 5 % IV SOLN: INTRAVENOUS | Status: AC

## 2021-01-11 NOTE — Progress Notes (Signed)
PROGRESS NOTE    [name]  MRN:[id number] DOB: 10/10/1934 DOA: 01/07/2021 PCP: Shore, Ethel C, PA-C    Brief Narrative:  86 y.o. female with medical history significant of late onset Alzheimer's disease with behavior disturbance, hypertension, depression, anxiety, right bundle blockade, thrombocytopenia, psoriasis, who presents with fever, worsened mental status and diarrhea.  Patient has dementia, worsening mental status, not arousable, cannot provide any medical history.  Per her daughter, patient was noted to have fever of 105.2 since last night.  Patient also has diarrhea, does not seem to have chest pain, respiratory distress, cough, shortness breath or abdominal pain.  Not sure if patient has symptoms of UTI.  She slightly moves her extremities on painful stimuli.  No facial droop noted.  Per her daughter, at her normal baseline, patient could talk to family members, usually not oriented to time and place.  1/27: Daughter was present at bedside my evaluation this morning.  Reports that her mother is a little bit better this morning.  Opens eyes.  Attempting to speak though speech remains somewhat garbled.  1/28: Hemodynamically stable.  Mental status slowly improving.  Urine culture positive for E. coli.  Antibiotics de-escalate to ceftriaxone.  1/29: No status changes.  Remains hemodynamically stable.   oral intake remains poor.  Daughter met with palliative care yesterday.  Plan to monitor patient over the weekend and if no appreciable improvement by Monday we will consider transition to hospice care.   Assessment & Plan:   Principal Problem:   Severe sepsis (HCC) Active Problems:   Dementia with behavioral disturbance (HCC)   Acute metabolic encephalopathy   Depression with anxiety   UTI (urinary tract infection)   Hypernatremia   Elevated troponin   Abnormal LFTs   Diarrhea   Comatose (HCC)   Sepsis (HCC)  Severe sepsis secondary to UTI Patient meets criteria for  severe sepsis with leukocytosis, fever, tachypnea, tachycardia.   Lactic acid elevated at 2.7, 2.5.   Blood pressures are improving Urine cx pos for E coli, likely source of sepsis Sepsis physiology improving Plan: Continue Rocephin 1 g every 24 hours while admitted Limits antibiotic course to 7 days max (day 5) Continue intravenous fluids Monitor cultures and fever curve Defer imaging for now Patient's daughter has met with palliative care.  Plan to monitor patient over weekend.  If no appreciable improvement by Monday will consider transition to hospice/comfort  Hypernatremia Sodium remains elevated P.o. fluid intake remains poor Plan: Resume d5w at 50cc/hr x 10hr   Dementia with behavioral disturbance (HCC) -Donepezil  Acute metabolic encephalopathy and comatose::  Ammonia level 30.  Possibly due to severe sepsis Mental status improving Not quite at baseline Remains minimally communicative though pleasant and smiles -Defer imaging at this time -Frequent neuro check -Treat as sepsis as above -Possible transition to hospice/comfort measures on Monday if patient does not appear to be improving  Depression with anxiety -Remeron  Elevated troponin: Troponin 50, 44, 34, likely due to demand ischemia secondary to severe sepsis -Trend troponin -Check A1c, FLP -Patient is on as needed aspirin for fever  Abnormal LFTs -Check hepatitis panel -Hold off ultrasound of right upper quadrant per her daughter  Diarrhea, resolved    DVT prophylaxis: Lovenox Code Status: DNR Family Communication: Daughter at bedside 1/29 Disposition Plan: Status is: Inpatient  Remains inpatient appropriate because:Inpatient level of care appropriate due to severity of illness   Dispo: The patient is from: SNF                Anticipated d/c is to: SNF              Anticipated d/c date is: 1 day              Patient currently is not medically stable to d/c.   Difficult to place patient  No  Sepsis essentially resolved at this time.  Metabolic encephalopathy is persistent.  Plan to monitor for the course of the day today.  Palliative care will meet with family tomorrow and will decide at that time about possible transition to comfort measures.    Level of care: Med-Surg  Consultants:  None  Procedures:   None  Antimicrobials  Vancomycin  Cefepime  Flagyl   Subjective: Patient seen and examined.  No family present at bedside this morning.  Stable, no distress.  Smiles, attempting to speak but minimally communicative Objective: Vitals:   01/10/21 2016 01/10/21 2345 01/11/21 0513 01/11/21 0740  BP: (!) 124/54 (!) 108/46 (!) 112/53 (!) 108/52  Pulse: 95 87 81 87  Resp: _0 Temp: 98.5 F (36.9 C) 98 F (36.7 C) 97.6 F (36.4 C) 97.8 F (36.6 C)  TempSrc:      SpO2: 98% 99% 99% 94%  Weight:      Height:        Intake/Output Summary (Last 24 hours) at 01/11/2021 1029 Last data filed at 01/11/2021 1017 Gross per 24 hour  Intake 837.85 ml  Output -  Net 837.85 ml   Filed Weights   01/07/21 0648  Weight: 59 kg    Examination:  General exam: No acute distress.  Appears frail Respiratory system: Poor respiratory effort.  Lungs clear.  Normal work of breathing Cardiovascular system: S1 & S2 heard, RRR. No JVD, murmurs, rubs, gallops or clicks. No pedal edema. Gastrointestinal system: Abdomen is nondistended, soft and nontender. No organomegaly or masses felt. Normal bowel sounds heard. Central nervous system: Unable to assess orientation.  No focal deficits Extremities: No edema, decreased power bilaterally Skin: Pale, scattered excoriations Psychiatry: Judgement and insight appear impaired. Mood & affect flattened.     Data Reviewed: I have personally reviewed following labs and imaging studies  CBC: Recent Labs  Lab 01/07/21 0649 01/08/21 0458 01/09/21 0437 01/10/21 0507 01/11/21 0532  WBC 16.4* 14.5* 10.3 7.6 9.4  NEUTROABS  11.9*  --  7.4 4.8 6.2  HGB 19.0* 14.2 12.5 12.5 12.6  HCT 62.2* 47.1* 41.0 38.8 41.4  MCV 105.4* 107.0* 103.5* 101.3* 104.5*  PLT 184 98* 97* 87* 923*   Basic Metabolic Panel: Recent Labs  Lab 01/08/21 0458 01/09/21 0437 01/09/21 1456 01/10/21 0507 01/11/21 0532  NA 157* 159* 152* 156* 149*  K 3.5 3.0* 3.5 3.8 3.4*  CL 121* 121* 115* 120* 110  CO2 _1 32  GLUCOSE 86 135* 143* 102* 155*  BUN 41* 26* _2 CREATININE 0.40* 0.35* 0.39* 0.35* 0.33*  CALCIUM 8.5* 8.4* 8.4* 8.8* 8.5*   GFR: Estimated Creatinine Clearance: 48.8 mL/min (A) (by C-G formula based on SCr of 0.33 mg/dL (L)). Liver Function Tests: Recent Labs  Lab 01/07/21 0649 01/08/21 0458 01/09/21 0437 01/10/21 0507 01/11/21 0532  AST 162* 81* 37 27 32  ALT 119* 103* 63* 43 37  ALKPHOS 87 87 88 79 89  BILITOT 1.9* 0.9 0.6 0.4 0.4  PROT 5.2* 5.1* 4.8* 4.6* 4.8*  ALBUMIN 2.8* 2.7* 2.6* 2.4* 2.5*   No results for input(s): LIPASE, AMYLASE in the last 168 hours.  Recent Labs  Lab 01/07/21 1148  AMMONIA 30   Coagulation Profile: Recent Labs  Lab 01/07/21 1832  INR 1.2   Cardiac Enzymes: No results for input(s): CKTOTAL, CKMB, CKMBINDEX, TROPONINI in the last 168 hours. BNP (last 3 results) No results for input(s): PROBNP in the last 8760 hours. HbA1C: No results for input(s): HGBA1C in the last 72 hours. CBG: Recent Labs  Lab 01/07/21 0652 01/10/21 0815 01/10/21 1122 01/11/21 0743  GLUCAP 145* 105* 156* 136*   Lipid Profile: No results for input(s): CHOL, HDL, LDLCALC, TRIG, CHOLHDL, LDLDIRECT in the last 72 hours. Thyroid Function Tests: No results for input(s): TSH, T4TOTAL, FREET4, T3FREE, THYROIDAB in the last 72 hours. Anemia Panel: No results for input(s): VITAMINB12, FOLATE, FERRITIN, TIBC, IRON, RETICCTPCT in the last 72 hours. Sepsis Labs: Recent Labs  Lab 01/07/21 0649 01/07/21 1148 01/07/21 1832 01/09/21 0437  PROCALCITON  --   --  0.55  --   LATICACIDVEN 2.7*  2.5* 2.2* 1.5    Recent Results (from the past 240 hour(s))  Culture, blood (routine x 2)     Status: None (Preliminary result)   Collection Time: 01/07/21  6:49 AM   Specimen: BLOOD  Result Value Ref Range Status   Specimen Description BLOOD RIGHT HAND  Final   Special Requests   Final    BOTTLES DRAWN AEROBIC AND ANAEROBIC Blood Culture results may not be optimal due to an inadequate volume of blood received in culture bottles   Culture   Final    NO GROWTH 4 DAYS Performed at [facility], [address] [place], [place] [address]    Report Status PENDING  Incomplete  Culture, blood (routine x 2)     Status: None (Preliminary result)   Collection Time: 01/07/21  6:49 AM   Specimen: BLOOD  Result Value Ref Range Status   Specimen Description BLOOD LEFT ANTECUBITAL  Final   Special Requests   Final    BOTTLES DRAWN AEROBIC AND ANAEROBIC Blood Culture results may not be optimal due to an inadequate volume of blood received in culture bottles   Culture   Final    NO GROWTH 4 DAYS Performed at [facility], [address] [place], [place] [address]    Report Status PENDING  Incomplete  Urine culture     Status: Abnormal   Collection Time: 01/07/21  6:49 AM   Specimen: Urine, Random  Result Value Ref Range Status   Specimen Description   Final    URINE, RANDOM Performed at [facility], [address] [place], [place] [address]    Special Requests   Final    NONE Performed at [facility], [address] [place], [place] [address]    Culture >=100,000 COLONIES/mL ESCHERICHIA COLI (A)  Final   Report Status 01/09/2021 FINAL  Final   Organism ID, Bacteria ESCHERICHIA COLI (A)  Final      Susceptibility   Escherichia coli - MIC*    AMPICILLIN >=32 RESISTANT Resistant     CEFAZOLIN <=4 SENSITIVE Sensitive     CEFEPIME <=0.12 SENSITIVE Sensitive     CEFTRIAXONE <=0.25 SENSITIVE Sensitive     CIPROFLOXACIN >=4 RESISTANT Resistant      GENTAMICIN <=1 SENSITIVE Sensitive     IMIPENEM <=0.25 SENSITIVE Sensitive     NITROFURANTOIN <=16 SENSITIVE Sensitive     TRIMETH/SULFA >=320 RESISTANT Resistant     AMPICILLIN/SULBACTAM >=32 RESISTANT Resistant     PIP/TAZO <=4 SENSITIVE Sensitive     * >=  100,000 COLONIES/mL ESCHERICHIA COLI  SARS Coronavirus 2 by RT PCR (hospital order, performed in [place] hospital lab) Nasopharyngeal Nasopharyngeal Swab     Status: None   Collection Time: 01/07/21  6:49 AM   Specimen: Nasopharyngeal Swab  Result Value Ref Range Status   SARS Coronavirus 2 NEGATIVE NEGATIVE Final    Comment: (NOTE) SARS-CoV-2 target nucleic acids are NOT DETECTED.  The SARS-CoV-2 RNA is generally detectable in upper and lower respiratory specimens during the acute phase of infection. The lowest concentration of SARS-CoV-2 viral copies this assay can detect is 250 copies / mL. A negative result does not preclude SARS-CoV-2 infection and should not be used as the sole basis for treatment or other patient management decisions.  A negative result may occur with improper specimen collection / handling, submission of specimen other than nasopharyngeal swab, presence of viral mutation(s) within the areas targeted by this assay, and inadequate number of viral copies (<250 copies / mL). A negative result must be combined with clinical observations, patient history, and epidemiological information.  Fact Sheet for Patients:   https://www.fda.gov/media/136312/download  Fact Sheet for Healthcare Providers: https://www.fda.gov/media/136313/download  This test is not yet approved or  cleared by the [place] FDA and has been authorized for detection and/or diagnosis of SARS-CoV-2 by FDA under an Emergency Use Authorization (EUA).  This EUA will remain in effect (meaning this test can be used) for the duration of the COVID-19 declaration under Section 564(b)(1) of the Act, 21 U.S.C. section 360bbb-3(b)(1), unless  the authorization is terminated or revoked sooner.  Performed at [facility], [address] [place], [place] [address]   MRSA PCR Screening     Status: None   Collection Time: 01/09/21  5:27 AM   Specimen: Nasal Mucosa; Nasopharyngeal  Result Value Ref Range Status   MRSA by PCR NEGATIVE NEGATIVE Final    Comment:        The GeneXpert MRSA Assay (FDA approved for NASAL specimens only), is one component of a comprehensive MRSA colonization surveillance program. It is not intended to diagnose MRSA infection nor to guide or monitor treatment for MRSA infections. Performed at [facility], [address] [place], [place] [address]          Radiology Studies: No results found.      Scheduled Meds: . artificial tears   Both Eyes QHS  . buPROPion  150 mg Oral Daily  . cholecalciferol  2,000 Units Oral Daily  . donepezil  10 mg Oral QHS  . enoxaparin (LOVENOX) injection  40 mg Subcutaneous Q24H  . guaiFENesin  600 mg Oral BID  . lidocaine  1 patch Transdermal q AM  . mirtazapine  15 mg Oral QHS  . oyster calcium  500 mg of elemental calcium Oral TID   Continuous Infusions: . cefTRIAXone (ROCEPHIN)  IV 1 g (01/11/21 0829)     LOS: 4 days    Time spent: 15 minutes     B , MD [facility] Pager [phone]  If 7PM-7AM, please contact night-coverage 01/11/2021, 10:29 AM  

## 2021-01-12 LAB — CULTURE, BLOOD (ROUTINE X 2)
Culture: NO GROWTH
Culture: NO GROWTH

## 2021-01-12 LAB — GLUCOSE, CAPILLARY: Glucose-Capillary: 96 mg/dL (ref 70–99)

## 2021-01-12 LAB — SARS CORONAVIRUS 2 BY RT PCR (HOSPITAL ORDER, PERFORMED IN ~~LOC~~ HOSPITAL LAB): SARS Coronavirus 2: NEGATIVE

## 2021-01-12 MED ORDER — BIOTENE DRY MOUTH MT LIQD: 15.0000 mL | OROMUCOSAL | Status: DC | PRN

## 2021-01-12 MED ORDER — GLYCOPYRROLATE 1 MG PO TABS
1.0000 mg | ORAL_TABLET | ORAL | Status: DC | PRN
  Filled 2021-01-12: qty 1

## 2021-01-12 MED ORDER — MAGIC MOUTHWASH
15.0000 mL | Freq: Four times a day (QID) | ORAL | Status: DC | PRN
  Filled 2021-01-12: qty 20

## 2021-01-12 MED ORDER — LIDOCAINE 5 % EX PTCH: 1.0000 | MEDICATED_PATCH | Freq: Every morning | CUTANEOUS | 0 refills | Status: AC

## 2021-01-12 MED ORDER — ARTIFICIAL TEARS OPHTHALMIC OINT: TOPICAL_OINTMENT | Freq: Every day | OPHTHALMIC | Status: AC

## 2021-01-12 MED ORDER — GLYCOPYRROLATE 0.2 MG/ML IJ SOLN
0.2000 mg | INTRAMUSCULAR | Status: DC | PRN
  Filled 2021-01-12: qty 1

## 2021-01-12 MED ORDER — LORAZEPAM 1 MG PO TABS: 1.0000 mg | ORAL_TABLET | ORAL | Status: DC | PRN

## 2021-01-12 MED ORDER — MORPHINE SULFATE (CONCENTRATE) 10 MG/0.5ML PO SOLN: 5.0000 mg | ORAL | Status: DC | PRN

## 2021-01-12 MED ORDER — HALOPERIDOL LACTATE 5 MG/ML IJ SOLN: 0.5000 mg | INTRAMUSCULAR | Status: DC | PRN

## 2021-01-12 MED ORDER — LORAZEPAM 1 MG PO TABS: 1.0000 mg | ORAL_TABLET | ORAL | 0 refills | Status: AC | PRN

## 2021-01-12 MED ORDER — LORAZEPAM 2 MG/ML IJ SOLN: 1.0000 mg | INTRAMUSCULAR | Status: DC | PRN

## 2021-01-12 MED ORDER — HALOPERIDOL 0.5 MG PO TABS
0.5000 mg | ORAL_TABLET | ORAL | Status: DC | PRN
  Filled 2021-01-12: qty 1

## 2021-01-12 MED ORDER — MORPHINE SULFATE (CONCENTRATE) 10 MG/0.5ML PO SOLN: 5.0000 mg | ORAL | 0 refills | Status: AC | PRN

## 2021-01-12 MED ORDER — LORAZEPAM 2 MG/ML PO CONC: 1.0000 mg | ORAL | Status: DC | PRN

## 2021-01-12 MED ORDER — GLYCOPYRROLATE 1 MG PO TABS: 1.0000 mg | ORAL_TABLET | ORAL | Status: AC | PRN

## 2021-01-12 MED ORDER — BIOTENE DRY MOUTH MT LIQD: 15.0000 mL | OROMUCOSAL | Status: AC | PRN

## 2021-01-12 MED ORDER — IPRATROPIUM-ALBUTEROL 0.5-2.5 (3) MG/3ML IN SOLN: 3.0000 mL | RESPIRATORY_TRACT | Status: AC | PRN

## 2021-01-12 MED ORDER — DIPHENHYDRAMINE HCL 50 MG/ML IJ SOLN: 12.5000 mg | INTRAMUSCULAR | Status: DC | PRN

## 2021-01-12 MED ORDER — IBUPROFEN 400 MG PO TABS: 400.0000 mg | ORAL_TABLET | Freq: Four times a day (QID) | ORAL | 0 refills | Status: AC | PRN

## 2021-01-12 MED ORDER — MORPHINE SULFATE (PF) 2 MG/ML IV SOLN
1.0000 mg | INTRAVENOUS | Status: DC | PRN
Start: 2021-01-12 — End: 2021-01-12

## 2021-01-12 MED ORDER — MAGIC MOUTHWASH: 15.0000 mL | Freq: Four times a day (QID) | ORAL | 0 refills | Status: AC | PRN

## 2021-01-12 MED ORDER — HALOPERIDOL LACTATE 2 MG/ML PO CONC
0.5000 mg | ORAL | Status: DC | PRN
  Filled 2021-01-12: qty 0.3

## 2021-01-12 NOTE — Discharge Summary (Signed)
Physician Discharge Summary  [name] EPP:[id number] DOB: [birth year] DOA: 01/07/2021  PCP: Willette Pa, PA-C  Admit date: 01/07/2021 Discharge date: 01/12/2021  [facility]position: [facility] with hospice services   Home Health: No Equipment/Devices: None Discharge Condition: Hospice CODE STATUS: Comfort care Diet recommendation:  Dysphagia   Brief/Interim Summary:  87 y.o.femalewith medical history significant oflate onset Alzheimer's disease with behavior disturbance, hypertension, depression, anxiety, right bundle blockade, thrombocytopenia, psoriasis, who presents with fever, worsened mental status and diarrhea.  Patient has dementia,worsening mental status, not arousable, cannot provide any medical history. Per her daughter, patient was noted to have fever of 105.2 since last night. Patient also has diarrhea, does not seem to have chest pain, respiratory distress, cough, shortness breath or abdominal pain. Not sure if patient has symptoms of UTI. She slightly moves her extremities on painful stimuli. No facial droop noted. Per her daughter, at her normal baseline, patient couldtalk to family members, usually not oriented to time and place.  1/27: Daughter was present at bedside my evaluation this morning.  Reports that her mother is a little bit better this morning.  Opens eyes.  Attempting to speak though speech remains somewhat garbled.  1/28: Hemodynamically stable.  Mental status slowly improving.  Urine culture positive for E. coli.  Antibiotics de-escalate to ceftriaxone.  1/29: No status changes.  Remains hemodynamically stable.   oral intake remains poor.  Daughter met with palliative care yesterday.  Plan to monitor patient over the weekend and if no appreciable improvement by Monday we will consider transition to hospice care.  1/31: No acute status changes.  Patient sleeping.  Does not appear in any visible distress.  Daughter at  bedside this morning.  We had a lengthy conversation regarding overall prognosis and goals of care.  Palliative care has been shifted to another hospital today.  Received message from [facility].  After my conversation with the patient's daughter we have agreed to transition to full comfort measures at this time.  I explained what that meant and notified case manager will reach out to hospice liaison. Hospice liaison met with patient and family member at bedside. After the discussion will discharge to [facility] with hospice services. Comfort medications continued on discharge medication reconciliation. Prescriptions for controlled substances left on paper chart.   Discharge Diagnoses:  Principal Problem:   Severe sepsis ([place]) Active Problems:   Dementia with behavioral disturbance (H[facility])   Acute metabolic encephalopathy   Depression with anxiety   UTI (urinary tract infection)   Hypernatremia   Elevated troponin   Abnormal LFTs   Diarrhea   Comatose ([place])   Sepsis ([place]) Severe sepsis secondary to UTI Patient meets criteria for severe sepsis with leukocytosis, fever, tachypnea, tachycardia.  Lactic acid elevated at 2.7, 2.5.  Blood pressures are improving Urine cx pos for E coli, likely source of sepsis Sepsis physiology improving Plan: Transition to comfort measures today All antibiotics and fluids stopped  Hypernatremia Sodium remains elevated P.o. fluid intake remains poor Plan: Transition to comfort measures.  All antibiotics fluids and lab stopped   Dementia with behavioral disturbance (HCC) -Home medication stopped -Comfort measures orders initiated  Acute metabolic encephalopathyand comatose:: Ammonia level 30. Possibly due to severe sepsis Mental status improving Not quite at baseline Remains minimally communicative though pleasant and smiles -No imaging indicated.  Transition to comfort measures   Discharge Instructions  Discharge Instructions     Diet - low sodium heart healthy   Complete  by: As directed    Increase activity slowly   Complete by: As directed      Allergies as of 01/12/2021   No Known Allergies     Medication List    STOP taking these medications   acetaminophen 325 MG tablet Commonly known as: TYLENOL   buPROPion 150 MG 24 hr tablet Commonly known as: WELLBUTRIN XL   donepezil 10 MG tablet Commonly known as: ARICEPT   Lidocaine 4 % Ptch Replaced by: lidocaine 5 %   mirtazapine 15 MG tablet Commonly known as: REMERON   oyster calcium 500 MG Tabs tablet   Vitamin D 50 MCG (2000 UT) tablet     TAKE these medications   antiseptic oral rinse Liqd Apply 15 mLs topically as needed for dry mouth.   artificial tears Oint ophthalmic ointment Commonly known as: LACRILUBE Place into both eyes at bedtime.   glycopyrrolate 1 MG tablet Commonly known as: ROBINUL Take 1 tablet (1 mg total) by mouth every 4 (four) hours as needed (excessive secretions).   ibuprofen 400 MG tablet Commonly known as: ADVIL Take 1 tablet (400 mg total) by mouth every 6 (six) hours as needed for fever.   ipratropium-albuterol 0.5-2.5 (3) MG/3ML Soln Commonly known as: DUONEB Take 3 mLs by nebulization every 4 (four) hours as needed.   lidocaine 5 % Commonly known as: LIDODERM Place 1 patch onto the skin in the morning. Remove & Discard patch within 12 hours or as directed by MD Start taking on: January 13, 2021 Replaces: Lidocaine 4 % Ptch   LORazepam 1 MG tablet Commonly known as: ATIVAN Take 1 tablet (1 mg total) by mouth every 4 (four) hours as needed for up to 3 days for anxiety.   magic mouthwash Soln Take 15 mLs by mouth every 6 (six) hours as needed for mouth pain (mouth pain / discomfort).   morphine CONCENTRATE 10 MG/0.5ML Soln concentrated solution Take 0.25 mLs (5 mg total) by mouth every 2 (two) hours as needed for up to 3 days for moderate pain (or dyspnea).       No Known  Allergies  Consultations: Palliative care  Procedures/Studies: DG Chest Port 1 View  Result Date: 01/07/2021 CLINICAL DATA:  Altered mental status. EXAM: PORTABLE CHEST 1 VIEW COMPARISON:  Chest x-ray 01/17/2018. FINDINGS: Mediastinum hilar structures normal. Low lung volumes with mild bibasilar atelectasis. No prominent pleural effusion. No pneumothorax. Heart size normal. Thoracic spine scoliosis and degenerative change. Surgical clips right upper quadrant. IMPRESSION: Low lung volumes with mild bibasilar atelectasis. Electronically Signed   By: Marcello Moores  Register   On: 01/07/2021 07:02    (Echo, Carotid, EGD, Colonoscopy, ERCP)    Subjective: Patient seen and examined on the day of discharge. Verbally unresponsive at time my evaluation. In no visible distress. Daughter at bedside.  Discharge Exam: Vitals:   01/12/21 0553 01/12/21 0742  BP: 98/60 (!) 141/67  Pulse: 82 89  Resp: 16 17  Temp: 97.7 F (36.5 C) 97.9 F (36.6 C)  SpO2: 97% 96%   Vitals:   01/11/21 2044 01/12/21 0015 01/12/21 0553 01/12/21 0742  BP: (!) 121/58 136/68 98/60 (!) 141/67  Pulse: (!) 104 (!) 104 82 89  Resp: 18 19 16 17   Temp: (!) 97.5 F (36.4 C) 98.5 F (36.9 C) 97.7 F (36.5 C) 97.9 F (36.6 C)  TempSrc: Oral Oral Oral   SpO2: 96% 98% 97% 96%  Weight:      Height:  General: Patient is asleep. No apparent distress Cardiovascular: RRR, S1/S2 +, no rubs, no gallops Respiratory: Poor respiratory effort. Lungs clear. 1 L Abdominal: Soft, NT, ND, bowel sounds + Extremities: no edema, no cyanosis    The results of significant diagnostics from this hospitalization (including imaging, microbiology, ancillary and laboratory) are listed below for reference.     Microbiology: Recent Results (from the past 240 hour(s))  Culture, blood (routine x 2)     Status: None   Collection Time: 01/07/21  6:49 AM   Specimen: BLOOD  Result Value Ref Range Status   Specimen Description BLOOD RIGHT  HAND  Final   Special Requests   Final    BOTTLES DRAWN AEROBIC AND ANAEROBIC Blood Culture results may not be optimal due to an inadequate volume of blood received in culture bottles   Culture   Final    NO GROWTH 5 DAYS Performed at [facility], [address] [place], [place] [address]    Report Status 01/12/2021 FINAL  Final  Culture, blood (routine x 2)     Status: None   Collection Time: 01/07/21  6:49 AM   Specimen: BLOOD  Result Value Ref Range Status   Specimen Description BLOOD LEFT ANTECUBITAL  Final   Special Requests   Final    BOTTLES DRAWN AEROBIC AND ANAEROBIC Blood Culture results may not be optimal due to an inadequate volume of blood received in culture bottles   Culture   Final    NO GROWTH 5 DAYS Performed at [facility], [address] [place], [place] [address]    Report Status 01/12/2021 FINAL  Final  Urine culture     Status: Abnormal   Collection Time: 01/07/21  6:49 AM   Specimen: Urine, Random  Result Value Ref Range Status   Specimen Description   Final    URINE, RANDOM Performed at [facility] [address] [place], [place] [address]    Special Requests   Final    NONE Performed at [facility], [address] [place], [place] [address]    Culture >=100,000 COLONIES/mL ESCHERICHIA COLI (A)  Final   Report Status 01/09/2021 FINAL  Final   Organism ID, Bacteria ESCHERICHIA COLI (A)  Final      Susceptibility   Escherichia coli - MIC*    AMPICILLIN >=32 RESISTANT Resistant     CEFAZOLIN <=4 SENSITIVE Sensitive     CEFEPIME <=0.12 SENSITIVE Sensitive     CEFTRIAXONE <=0.25 SENSITIVE Sensitive     CIPROFLOXACIN >=4 RESISTANT Resistant     GENTAMICIN <=1 SENSITIVE Sensitive     IMIPENEM <=0.25 SENSITIVE Sensitive     NITROFURANTOIN <=16 SENSITIVE Sensitive     TRIMETH/SULFA >=320 RESISTANT Resistant     AMPICILLIN/SULBACTAM >=32 RESISTANT Resistant     PIP/TAZO <=4 SENSITIVE Sensitive     *  >=100,000 COLONIES/mL ESCHERICHIA COLI  SARS Coronavirus 2 by RT PCR (hospital order, performed in [place] hospital lab) Nasopharyngeal Nasopharyngeal Swab     Status: None   Collection Time: 01/07/21  6:49 AM   Specimen: Nasopharyngeal Swab  Result Value Ref Range Status   SARS Coronavirus 2 NEGATIVE NEGATIVE Final    Comment: (NOTE) SARS-CoV-2 target nucleic acids are NOT DETECTED.  The SARS-CoV-2 RNA is generally detectable in upper and lower respiratory specimens during the acute phase of infection. The lowest concentration of SARS-CoV-2 viral copies this assay can detect is 250 copies / mL. A negative result does not preclude  SARS-CoV-2 infection and should not be used as the sole basis for treatment or other patient management decisions.  A negative result may occur with improper specimen collection / handling, submission of specimen other than nasopharyngeal swab, presence of viral mutation(s) within the areas targeted by this assay, and inadequate number of viral copies (<250 copies / mL). A negative result must be combined with clinical observations, patient history, and epidemiological information.  Fact Sheet for Patients:   StrictlyIdeas.no  Fact Sheet for Healthcare Providers: BankingDealers.co.za  This test is not yet approved or  cleared by the [place] FDA and has been authorized for detection and/or diagnosis of SARS-CoV-2 by FDA under an Emergency Use Authorization (EUA).  This EUA will remain in effect (meaning this test can be used) for the duration of the COVID-19 declaration under Section 564(b)(1) of the Act, 21 U.S.C. section 360bbb-3(b)(1), unless the authorization is terminated or revoked sooner.  Performed at [facility], [address] [place], [place] [address]   MRSA PCR Screening     Status: None   Collection Time: 01/09/21  5:27 AM   Specimen: Nasal Mucosa; Nasopharyngeal   Result Value Ref Range Status   MRSA by PCR NEGATIVE NEGATIVE Final    Comment:        The GeneXpert MRSA Assay (FDA approved for NASAL specimens only), is one component of a comprehensive MRSA colonization surveillance program. It is not intended to diagnose MRSA infection nor to guide or monitor treatment for MRSA infections. Performed at [facility], [address] [place], [place] [address]      Labs: BNP (last 3 results) Recent Labs    01/07/21 1148  BNP 46.6   Basic Metabolic Panel: Recent Labs  Lab 01/08/21 0458 01/09/21 0437 01/09/21 1456 01/10/21 0507 01/11/21 0532  NA 157* 159* 152* 156* 149*  K 3.5 3.0* 3.5 3.8 3.4*  CL 121* 121* 115* 120* 110  CO2 27 30 27 30  32  GLUCOSE 86 135* 143* 102* 155*  BUN 41* 26* 20 16 14   CREATININE 0.40* 0.35* 0.39* 0.35* 0.33*  CALCIUM 8.5* 8.4* 8.4* 8.8* 8.5*   Liver Function Tests: Recent Labs  Lab 01/07/21 0649 01/08/21 0458 01/09/21 0437 01/10/21 0507 01/11/21 0532  AST 162* 81* 37 27 32  ALT 119* 103* 63* 43 37  ALKPHOS 87 87 88 79 89  BILITOT 1.9* 0.9 0.6 0.4 0.4  PROT 5.2* 5.1* 4.8* 4.6* 4.8*  ALBUMIN 2.8* 2.7* 2.6* 2.4* 2.5*   No results for input(s): LIPASE, AMYLASE in the last 168 hours. Recent Labs  Lab 01/07/21 1148  AMMONIA 30   CBC: Recent Labs  Lab 01/07/21 0649 01/08/21 0458 01/09/21 0437 01/10/21 0507 01/11/21 0532  WBC 16.4* 14.5* 10.3 7.6 9.4  NEUTROABS 11.9*  --  7.4 4.8 6.2  HGB 19.0* 14.2 12.5 12.5 12.6  HCT 62.2* 47.1* 41.0 38.8 41.4  MCV 105.4* 107.0* 103.5* 101.3* 104.5*  PLT 184 98* 97* 87* 105*   Cardiac Enzymes: No results for input(s): CKTOTAL, CKMB, CKMBINDEX, TROPONINI in the last 168 hours. BNP: Invalid input(s): POCBNP CBG: Recent Labs  Lab 01/07/21 0652 01/10/21 0815 01/10/21 1122 01/11/21 0743 01/12/21 0744  GLUCAP 145* 105* 156* 136* 96   D-Dimer No results for input(s): DDIMER in the last 72 hours. Hgb A1c No results for input(s):  HGBA1C in the last 72 hours. Lipid Profile No results for input(s): CHOL, HDL, LDLCALC, TRIG, CHOLHDL, LDLDIRECT in the last 72 hours. Thyroid function studies No  results for input(s): TSH, T4TOTAL, T3FREE, THYROIDAB in the last 72 hours.  Invalid input(s): FREET3 Anemia work up No results for input(s): VITAMINB12, FOLATE, FERRITIN, TIBC, IRON, RETICCTPCT in the last 72 hours. Urinalysis    Component Value Date/Time   COLORURINE AMBER (A) 01/07/2021 0649   APPEARANCEUR CLOUDY (A) 01/07/2021 0649   APPEARANCEUR Cloudy (A) 04/19/2016 0945   LABSPEC 1.025 01/07/2021 0649   LABSPEC 1.019 03/06/2012 2000   PHURINE 5.0 01/07/2021 0649   GLUCOSEU NEGATIVE 01/07/2021 0649   GLUCOSEU Negative 03/06/2012 2000   HGBUR NEGATIVE 01/07/2021 [place] 01/07/2021 0649   BILIRUBINUR Negative 04/19/2016 0945   BILIRUBINUR Negative 03/06/2012 2000   KETONESUR NEGATIVE 01/07/2021 0649   PROTEINUR 100 (A) 01/07/2021 0649   NITRITE NEGATIVE 01/07/2021 0649   LEUKOCYTESUR NEGATIVE 01/07/2021 0649   LEUKOCYTESUR Negative 03/06/2012 2000   Sepsis Labs Invalid input(s): PROCALCITONIN,  WBC,  LACTICIDVEN Microbiology Recent Results (from the past 240 hour(s))  Culture, blood (routine x 2)     Status: None   Collection Time: 01/07/21  6:49 AM   Specimen: BLOOD  Result Value Ref Range Status   Specimen Description BLOOD RIGHT HAND  Final   Special Requests   Final    BOTTLES DRAWN AEROBIC AND ANAEROBIC Blood Culture results may not be optimal due to an inadequate volume of blood received in culture bottles   Culture   Final    NO GROWTH 5 DAYS Performed at [facility], [address] [place], [place] [address]    Report Status 01/12/2021 FINAL  Final  Culture, blood (routine x 2)     Status: None   Collection Time: 01/07/21  6:49 AM   Specimen: BLOOD  Result Value Ref Range Status   Specimen Description BLOOD LEFT ANTECUBITAL  Final   Special Requests   Final     BOTTLES DRAWN AEROBIC AND ANAEROBIC Blood Culture results may not be optimal due to an inadequate volume of blood received in culture bottles   Culture   Final    NO GROWTH 5 DAYS Performed at [facility], [address] [place], [place] [address]    Report Status 01/12/2021 FINAL  Final  Urine culture     Status: Abnormal   Collection Time: 01/07/21  6:49 AM   Specimen: Urine, Random  Result Value Ref Range Status   Specimen Description   Final    URINE, RANDOM Performed at [facility], [address] [place], [place] [address]    Special Requests   Final    NONE Performed at [facility], [address] [place],  [address]    Culture >=100,000 COLONIES/mL ESCHERICHIA COLI (A)  Final   Report Status 01/09/2021 FINAL  Final   Organism ID, Bacteria ESCHERICHIA COLI (A)  Final      Susceptibility   Escherichia coli - MIC*    AMPICILLIN >=32 RESISTANT Resistant     CEFAZOLIN <=4 SENSITIVE Sensitive     CEFEPIME <=0.12 SENSITIVE Sensitive     CEFTRIAXONE <=0.25 SENSITIVE Sensitive     CIPROFLOXACIN >=4 RESISTANT Resistant     GENTAMICIN <=1 SENSITIVE Sensitive     IMIPENEM <=0.25 SENSITIVE Sensitive     NITROFURANTOIN <=16 SENSITIVE Sensitive     TRIMETH/SULFA >=320 RESISTANT Resistant     AMPICILLIN/SULBACTAM >=32 RESISTANT Resistant     PIP/TAZO <=4 SENSITIVE Sensitive     * >=100,000 COLONIES/mL ESCHERICHIA COLI  SARS Coronavirus 2 by RT PCR (hospital  order, performed in [facility] hospital lab) Nasopharyngeal Nasopharyngeal Swab     Status: None   Collection Time: 01/07/21  6:49 AM   Specimen: Nasopharyngeal Swab  Result Value Ref Range Status   SARS Coronavirus 2 NEGATIVE NEGATIVE Final    Comment: (NOTE) SARS-CoV-2 target nucleic acids are NOT DETECTED.  The SARS-CoV-2 RNA is generally detectable in upper and lower respiratory specimens during the acute phase of infection. The lowest concentration of SARS-CoV-2 viral copies  this assay can detect is 250 copies / mL. A negative result does not preclude SARS-CoV-2 infection and should not be used as the sole basis for treatment or other patient management decisions.  A negative result may occur with improper specimen collection / handling, submission of specimen other than nasopharyngeal swab, presence of viral mutation(s) within the areas targeted by this assay, and inadequate number of viral copies (<250 copies / mL). A negative result must be combined with clinical observations, patient history, and epidemiological information.  Fact Sheet for Patients:   StrictlyIdeas.no  Fact Sheet for Healthcare Providers: BankingDealers.co.za  This test is not yet approved or  cleared by the [place] FDA and has been authorized for detection and/or diagnosis of SARS-CoV-2 by FDA under an Emergency Use Authorization (EUA).  This EUA will remain in effect (meaning this test can be used) for the duration of the COVID-19 declaration under Section 564(b)(1) of the Act, 21 U.S.C. section 360bbb-3(b)(1), unless the authorization is terminated or revoked sooner.  Performed at [facility], [address] [place], [place] [address]   MRSA PCR Screening     Status: None   Collection Time: 01/09/21  5:27 AM   Specimen: Nasal Mucosa; Nasopharyngeal  Result Value Ref Range Status   MRSA by PCR NEGATIVE NEGATIVE Final    Comment:        The GeneXpert MRSA Assay (FDA approved for NASAL specimens only), is one component of a comprehensive MRSA colonization surveillance program. It is not intended to diagnose MRSA infection nor to guide or monitor treatment for MRSA infections. Performed at [facility], [address], [place], [place] [address]      Time coordinating discharge: Over 30 minutes  SIGNED:   Sidney Ace, MD  [facility] 01/12/2021, 12:47 PM Pager   If 7PM-7AM,  please contact night-coverage

## 2021-01-12 NOTE — Progress Notes (Addendum)
[facility] Hospital Liaison RN note:  Received request from Dr. Ralene Muskrat fro hospice services at [facility] after discharge. Chart and patient information under review by [facility] physician. Hospice has been approved.  Spoke with daughter, [name] in the room to initiate education related to hospice philosophy, services provided and to answer any questions. [name] verbalized understanding and had no questions. Plan is to discharge back to [facility] once DME is in place.  Oxygen is to be delivered to [facility]. Contact is daughter, [name] to arrange hospice admission visit. Her cell number is [phone].  Please send signed and completed DNR home with patient. Please provide prescription at discharge as needed to ensure ongoing symptom management.  ACC information and contact numbers provided to [facility]. Hospital care team is aware of above information.  Please call with any hospice related questions or concerns.  Thank you for the opportunity to participate in this patient's care.  Zandra Abts, RN [facility] Liaison [phone]

## 2021-01-12 NOTE — NC FL2 (Signed)
[place] LEVEL OF CARE SCREENING TOOL     IDENTIFICATION  Patient Name: [name] Birthdate: 02/10/1934 Sex: female Admission Date (Current Location): 01/07/2021  [place] and [place] Number:  Engineering geologist and Address:  [facility], [address] [place], [place] [address]      Provider Number: [id number]  Attending Physician Name and Address:  Sidney Ace, MD  Relative Name and Phone Number:  Posey Pronto [phone]    Current Level of Care: Hospital Recommended Level of Care: [place] (Comment) (with Hospice services) Prior Approval Number:    Date Approved/Denied:   PASRR Number:    Discharge Plan:  (assisted living facility)    Current Diagnoses: Patient Active Problem List   Diagnosis Date Noted  . Sepsis ([place]) 01/08/2021  . Acute metabolic encephalopathy 14/43/1540  . Severe sepsis ([place]) 01/07/2021  . Depression with anxiety 01/07/2021  . UTI (urinary tract infection) 01/07/2021  . Hypernatremia 01/07/2021  . Elevated troponin 01/07/2021  . Abnormal LFTs 01/07/2021  . Diarrhea 01/07/2021  . Comatose ([place]) 01/07/2021  . Status post hip surgery 03/21/2018  . Closed right hip fracture ([place]) 01/18/2018  . Altered mental status 06/07/2017  . Hyperglycemia 06/07/2017  . Dementia with behavioral disturbance ([place]) 06/07/2017  . Weakness 06/04/2017  . Late onset Alzheimer's disease without behavioral disturbance ([place]) 09/07/2016  . B12 deficiency 02/07/2016  . Major depressive disorder, recurrent, moderate ([place]) 02/05/2016  . Vitamin D deficiency disease   . RBBB   . Thrombocytopenia ([place])   . Heart murmur   . Essential hypertension 09/05/2014    Orientation RESPIRATION BLADDER Height & Weight        O2 (2L) Incontinent Weight: 59 kg Height:  5\' 7"  (170.2 cm)  BEHAVIORAL SYMPTOMS/MOOD NEUROLOGICAL BOWEL NUTRITION STATUS      Incontinent Diet (Regular Diet)  AMBULATORY  STATUS COMMUNICATION OF NEEDS Skin   Total Care Verbally Normal                       Personal Care Assistance Level of Assistance  Bathing,Feeding,Dressing Bathing Assistance: Maximum assistance Feeding assistance: Maximum assistance Dressing Assistance: Maximum assistance     Functional Limitations Info             SPECIAL CARE FACTORS FREQUENCY                       Contractures Contractures Info: Not present    Additional Factors Info  Code Status,Allergies Code Status Info: DNR Allergies Info: No known allergies           Current Medications (01/12/2021):   Discharge Medications: TAKE these medications   antiseptic oral rinse Liqd Apply 15 mLs topically as needed for dry mouth.   artificial tears Oint ophthalmic ointment Commonly known as: LACRILUBE Place into both eyes at bedtime.   glycopyrrolate 1 MG tablet Commonly known as: ROBINUL Take 1 tablet (1 mg total) by mouth every 4 (four) hours as needed (excessive secretions).   ibuprofen 400 MG tablet Commonly known as: ADVIL Take 1 tablet (400 mg total) by mouth every 6 (six) hours as needed for fever.   ipratropium-albuterol 0.5-2.5 (3) MG/3ML Soln Commonly known as: DUONEB Take 3 mLs by nebulization every 4 (four) hours as needed.   lidocaine 5 % Commonly known as: LIDODERM Place 1 patch onto the skin in the morning. Remove & Discard patch within 12 hours or as directed by  MD Start taking on: January 13, 2021 Replaces: Lidocaine 4 % Ptch   LORazepam 1 MG tablet Commonly known as: ATIVAN Take 1 tablet (1 mg total) by mouth every 4 (four) hours as needed for up to 3 days for anxiety.   magic mouthwash Soln Take 15 mLs by mouth every 6 (six) hours as needed for mouth pain (mouth pain / discomfort).   morphine CONCENTRATE 10 MG/0.5ML Soln concentrated solution Take 0.25 mLs (5 mg total) by mouth every 2 (two) hours as needed for up to 3 days for moderate pain (or dyspnea).      Relevant Imaging Results:  Relevant Lab Results:   Additional Information    Shelbie Ammons, RN

## 2021-01-12 NOTE — TOC Initial Note (Signed)
Transition of Care [facility]) - Initial/Assessment Note    Patient Details  Name: [name] MRN: [id number] Date of Birth: May 13, 1934  Transition of [facility] CM/SW Contact:    Shelbie Ammons, RN Phone Number: 01/12/2021, 9:07 AM  Clinical Narrative:     RNCM met with patient and daughter in room. Patient resting in bed with eyes closed in no apparent distress. Daughter [name] has decided to pursue comfort care for her mother. Patient is a resident of [facility] and [name] would like to see her mother return there with [facility] if that is an option but if not she would like for her to go to the Hospice Home. [name] relays that her mother has not eaten since around lunch time yesterday and that she was choking quite a bit at that time.  RNCM reached out to [facility] with [facility] and left message to determine if patient will be able to return there.  RNCM reached out to Santiago Glad with [facility] and provided referral.               Expected Discharge Plan: [place] Barriers to Discharge: No Barriers Identified   Patient Goals and CMS Choice        Expected Discharge Plan and Services Expected Discharge Plan: [place]       Living arrangements for the past 2 months: [place]                                      Prior Living Arrangements/Services Living arrangements for the past 2 months: [place] Lives with:: Facility Resident Patient language and need for interpreter reviewed:: Yes Do you feel safe going back to the place where you live?: Yes      Need for Family Participation in Patient Care: Yes (Comment) Care giver support system in place?: Yes (comment)   Criminal Activity/Legal Involvement Pertinent to Current Situation/Hospitalization: No - Comment as needed  Activities of Daily Living      Permission Sought/Granted                  Emotional Assessment Appearance::  Appears stated age            Admission diagnosis:  Hypernatremia [E87.0] Sepsis ([place]) [A41.9] Severe sepsis ([place]) [A41.9, R65.20] Patient Active Problem List   Diagnosis Date Noted  . Sepsis ([place]) 01/08/2021  . Acute metabolic encephalopathy 59/74/1638  . Severe sepsis ([place]) 01/07/2021  . Depression with anxiety 01/07/2021  . UTI (urinary tract infection) 01/07/2021  . Hypernatremia 01/07/2021  . Elevated troponin 01/07/2021  . Abnormal LFTs 01/07/2021  . Diarrhea 01/07/2021  . Comatose ([place]) 01/07/2021  . Status post hip surgery 03/21/2018  . Closed right hip fracture ([place]) 01/18/2018  . Altered mental status 06/07/2017  . Hyperglycemia 06/07/2017  . Dementia with behavioral disturbance ([place]) 06/07/2017  . Weakness 06/04/2017  . Late onset Alzheimer's disease without behavioral disturbance ([place]) 09/07/2016  . B12 deficiency 02/07/2016  . Major depressive disorder, recurrent, moderate ([place]) 02/05/2016  . Vitamin D deficiency disease   . RBBB   . Thrombocytopenia ([place])   . Heart murmur   . Essential hypertension 09/05/2014   PCP:  Caralyn Guile Pharmacy:   [place], [place] - [address] [place] [address] Phone: [phone] Fax: [phone]  Social Determinants of Health (SDOH) Interventions    Readmission Risk Interventions No flowsheet data found.

## 2021-01-12 NOTE — Care Management Important Message (Signed)
Important Message  Patient Details  Name: [name] MRN: [id number] Date of Birth: 23-Jun-1934   Medicare Important Message Given:  Other (see comment)  Patient is on comfort care and out of respect for the patient and family no Important Message from Medicare given.  [name] 01/12/2021, 2:04 PM

## 2021-01-12 NOTE — Progress Notes (Signed)
   01/12/21 0950  Clinical Encounter Type  Visited With Patient and family together  Visit Type Spiritual support;Social support;Psychological support  Referral From Nurse  Consult/Referral To Chaplain  Spiritual Encounters  Spiritual Needs Prayer;Emotional;Grief support  Stress Factors  Family Stress Factors Loss;Loss of control;Major life changes   Chaplain received a referral from nurse for [name] daughter. The PT is in in palliative care, and according to the nurse and the daughter, the PT is in the last stages of her life. Chaplain was a calming presence for pt's daughter. Chaplain was able to use storytelling as a way to help comfort pt. Chaplain offered prayer for the pt's daughter and also prayed for the pt.

## 2021-01-12 NOTE — Progress Notes (Signed)
[name]  DHW:[id number] DOB: 21-Jun-1934 DOA: 01/07/2021 PCP: Caralyn Guile    Brief Narrative:  87 y.o. female with medical history significant of late onset Alzheimer's disease with behavior disturbance, hypertension, depression, anxiety, right bundle blockade, thrombocytopenia, psoriasis, who presents with fever, worsened mental status and diarrhea.  Patient has dementia, worsening mental status, not arousable, cannot provide any medical history.  Per her daughter, patient was noted to have fever of 105.2 since last night.  Patient also has diarrhea, does not seem to have chest pain, respiratory distress, cough, shortness breath or abdominal pain.  Not sure if patient has symptoms of UTI.  She slightly moves her extremities on painful stimuli.  No facial droop noted.  Per her daughter, at her normal baseline, patient could talk to family members, usually not oriented to time and place.  1/27: Daughter was present at bedside my evaluation this morning.  Reports that her mother is a little bit better this morning.  Opens eyes.  Attempting to speak though speech remains somewhat garbled.  1/28: Hemodynamically stable.  Mental status slowly improving.  Urine culture positive for E. coli.  Antibiotics de-escalate to ceftriaxone.  1/29: No status changes.  Remains hemodynamically stable.   oral intake remains poor.  Daughter met with palliative care yesterday.  Plan to monitor patient over the weekend and if no appreciable improvement by Monday we will consider transition to hospice care.  1/31: No acute status changes.  Patient sleeping.  Does not appear in any visible distress.  Daughter at bedside this morning.  We had a lengthy conversation regarding overall prognosis and goals of care.  Palliative care has been shifted to another hospital today.  Received message from [facility].  After my conversation with the patient's daughter we have agreed to transition to full  comfort measures at this time.  I explained what that meant and notified case manager will reach out to hospice liaison.   Assessment & Plan:   Principal Problem:   Severe sepsis ([place]) Active Problems:   Dementia with behavioral disturbance (HCC)   Acute metabolic encephalopathy   Depression with anxiety   UTI (urinary tract infection)   Hypernatremia   Elevated troponin   Abnormal LFTs   Diarrhea   Comatose ([place])   Sepsis ([place])  Severe sepsis secondary to UTI Patient meets criteria for severe sepsis with leukocytosis, fever, tachypnea, tachycardia.   Lactic acid elevated at 2.7, 2.5.   Blood pressures are improving Urine cx pos for E coli, likely source of sepsis Sepsis physiology improving Plan: Transition to comfort measures today All antibiotics and fluids stopped  Hypernatremia Sodium remains elevated P.o. fluid intake remains poor Plan: Transition to comfort measures.  All antibiotics fluids and lab stopped   Dementia with behavioral disturbance (HCC) -No medication stopped  Acute metabolic encephalopathy and comatose::  Ammonia level 30.  Possibly due to severe sepsis Mental status improving Not quite at baseline Remains minimally communicative though pleasant and smiles -No imaging indicated.  Transition to comfort measures    DVT prophylaxis: Lovenox Code Status: DNR Family Communication: Daughter at bedside 1/31 Disposition Plan: Status is: Inpatient  Remains inpatient appropriate because:Inpatient level of care appropriate due to severity of illness   Dispo: The patient is from: SNF              Anticipated d/c is to: Comfort measures/hospice home  Anticipated d/c date is: 1 day              Patient currently is not medically stable to d/c.   Difficult to place patient No  Sepsis physiology resolved however patient's functional status remains poor.  P.o. intake also remains poor.  Will transition to comfort measures today.  All  medications not focused on patient comfort have been discontinued.  Referral placed for hospice liaison to evaluate case.    Level of care: Med-Surg  Consultants:  Palliative care  Procedures:   None  Antimicrobials    Subjective: Seen and examined.  Sleeping this morning.  Not responsive to voice and touch.  Objective: Vitals:   01/11/21 2044 01/12/21 0015 01/12/21 0553 01/12/21 0742  BP: (!) 121/58 136/68 98/60 (!) 141/67  Pulse: (!) 104 (!) 104 82 89  Resp: 18 19 16 17   Temp: (!) 97.5 F (36.4 C) 98.5 F (36.9 C) 97.7 F (36.5 C) 97.9 F (36.6 C)  TempSrc: Oral Oral Oral   SpO2: 96% 98% 97% 96%  Weight:      Height:        Intake/Output Summary (Last 24 hours) at 01/12/2021 1018 Last data filed at 01/12/2021 1018 Gross per 24 hour  Intake 410.94 ml  Output 700 ml  Net -289.06 ml   Filed Weights   01/07/21 0648  Weight: 59 kg    Examination:  Exam limited due to comfort measure status  General exam: No acute distress.  Appears frail Respiratory system: Poor respiratory effort.  Lungs clear.  Normal work of breathing Cardiovascular system: S1 & S2 heard, RRR. No JVD, murmurs, rubs, gallops or clicks. No pedal edema.    Data Reviewed: I have personally reviewed following labs and imaging studies  CBC: Recent Labs  Lab 01/07/21 0649 01/08/21 0458 01/09/21 0437 01/10/21 0507 01/11/21 0532  WBC 16.4* 14.5* 10.3 7.6 9.4  NEUTROABS 11.9*  --  7.4 4.8 6.2  HGB 19.0* 14.2 12.5 12.5 12.6  HCT 62.2* 47.1* 41.0 38.8 41.4  MCV 105.4* 107.0* 103.5* 101.3* 104.5*  PLT 184 98* 97* 87* 161*   Basic Metabolic Panel: Recent Labs  Lab 01/08/21 0458 01/09/21 0437 01/09/21 1456 01/10/21 0507 01/11/21 0532  NA 157* 159* 152* 156* 149*  K 3.5 3.0* 3.5 3.8 3.4*  CL 121* 121* 115* 120* 110  CO2 27 30 27 30  32  GLUCOSE 86 135* 143* 102* 155*  BUN 41* 26* 20 16 14   CREATININE 0.40* 0.35* 0.39* 0.35* 0.33*  CALCIUM 8.5* 8.4* 8.4* 8.8* 8.5*    GFR: Estimated Creatinine Clearance: 48.8 mL/min (A) (by C-G formula based on SCr of 0.33 mg/dL (L)). Liver Function Tests: Recent Labs  Lab 01/07/21 0649 01/08/21 0458 01/09/21 0437 01/10/21 0507 01/11/21 0532  AST 162* 81* 37 27 32  ALT 119* 103* 63* 43 37  ALKPHOS 87 87 88 79 89  BILITOT 1.9* 0.9 0.6 0.4 0.4  PROT 5.2* 5.1* 4.8* 4.6* 4.8*  ALBUMIN 2.8* 2.7* 2.6* 2.4* 2.5*   No results for input(s): LIPASE, AMYLASE in the last 168 hours. Recent Labs  Lab 01/07/21 1148  AMMONIA 30   Coagulation Profile: Recent Labs  Lab 01/07/21 1832  INR 1.2   Cardiac Enzymes: No results for input(s): CKTOTAL, CKMB, CKMBINDEX, TROPONINI in the last 168 hours. BNP (last 3 results) No results for input(s): PROBNP in the last 8760 hours. HbA1C: No results for input(s): HGBA1C in the last 72 hours. CBG: Recent Labs  Lab 01/07/21  9678 01/10/21 0815 01/10/21 1122 01/11/21 0743 01/12/21 0744  GLUCAP 145* 105* 156* 136* 96   Lipid Profile: No results for input(s): CHOL, HDL, LDLCALC, TRIG, CHOLHDL, LDLDIRECT in the last 72 hours. Thyroid Function Tests: No results for input(s): TSH, T4TOTAL, FREET4, T3FREE, THYROIDAB in the last 72 hours. Anemia Panel: No results for input(s): VITAMINB12, FOLATE, FERRITIN, TIBC, IRON, RETICCTPCT in the last 72 hours. Sepsis Labs: Recent Labs  Lab 01/07/21 9381 01/07/21 1148 01/07/21 1832 01/09/21 0437  PROCALCITON  --   --  0.55  --   LATICACIDVEN 2.7* 2.5* 2.2* 1.5    Recent Results (from the past 240 hour(s))  Culture, blood (routine x 2)     Status: None   Collection Time: 01/07/21  6:49 AM   Specimen: BLOOD  Result Value Ref Range Status   Specimen Description BLOOD RIGHT HAND  Final   Special Requests   Final    BOTTLES DRAWN AEROBIC AND ANAEROBIC Blood Culture results may not be optimal due to an inadequate volume of blood received in culture bottles   Culture   Final    NO GROWTH 5 DAYS Performed at [facility],  [address] [place], [place] [address]    Report Status 01/12/2021 FINAL  Final  Culture, blood (routine x 2)     Status: None   Collection Time: 01/07/21  6:49 AM   Specimen: BLOOD  Result Value Ref Range Status   Specimen Description BLOOD LEFT ANTECUBITAL  Final   Special Requests   Final    BOTTLES DRAWN AEROBIC AND ANAEROBIC Blood Culture results may not be optimal due to an inadequate volume of blood received in culture bottles   Culture   Final    NO GROWTH 5 DAYS Performed at [facility], [address] [place], [place] [address]    Report Status 01/12/2021 FINAL  Final  Urine culture     Status: Abnormal   Collection Time: 01/07/21  6:49 AM   Specimen: Urine, Random  Result Value Ref Range Status   Specimen Description   Final    URINE, RANDOM Performed at [facility], [address] [place], [place] [address]    Special Requests   Final    NONE Performed at [facility], [address] [place],  [address]    Culture >=100,000 COLONIES/mL ESCHERICHIA COLI (A)  Final   Report Status 01/09/2021 FINAL  Final   Organism ID, Bacteria ESCHERICHIA COLI (A)  Final      Susceptibility   Escherichia coli - MIC*    AMPICILLIN >=32 RESISTANT Resistant     CEFAZOLIN <=4 SENSITIVE Sensitive     CEFEPIME <=0.12 SENSITIVE Sensitive     CEFTRIAXONE <=0.25 SENSITIVE Sensitive     CIPROFLOXACIN >=4 RESISTANT Resistant     GENTAMICIN <=1 SENSITIVE Sensitive     IMIPENEM <=0.25 SENSITIVE Sensitive     NITROFURANTOIN <=16 SENSITIVE Sensitive     TRIMETH/SULFA >=320 RESISTANT Resistant     AMPICILLIN/SULBACTAM >=32 RESISTANT Resistant     PIP/TAZO <=4 SENSITIVE Sensitive     * >=100,000 COLONIES/mL ESCHERICHIA COLI  SARS Coronavirus 2 by RT PCR (hospital order, performed in [place] hospital lab) Nasopharyngeal Nasopharyngeal Swab     Status: None   Collection Time: 01/07/21  6:49 AM   Specimen: Nasopharyngeal Swab  Result Value Ref  Range Status   SARS Coronavirus 2 NEGATIVE NEGATIVE Final    Comment: (NOTE) SARS-CoV-2 target nucleic acids are NOT  DETECTED.  The SARS-CoV-2 RNA is generally detectable in upper and lower respiratory specimens during the acute phase of infection. The lowest concentration of SARS-CoV-2 viral copies this assay can detect is 250 copies / mL. A negative result does not preclude SARS-CoV-2 infection and should not be used as the sole basis for treatment or other patient management decisions.  A negative result may occur with improper specimen collection / handling, submission of specimen other than nasopharyngeal swab, presence of viral mutation(s) within the areas targeted by this assay, and inadequate number of viral copies (<250 copies / mL). A negative result must be combined with clinical observations, patient history, and epidemiological information.  Fact Sheet for Patients:   StrictlyIdeas.no  Fact Sheet for Healthcare Providers: BankingDealers.co.za  This test is not yet approved or  cleared by the [place] FDA and has been authorized for detection and/or diagnosis of SARS-CoV-2 by FDA under an Emergency Use Authorization (EUA).  This EUA will remain in effect (meaning this test can be used) for the duration of the COVID-19 declaration under Section 564(b)(1) of the Act, 21 U.S.C. section 360bbb-3(b)(1), unless the authorization is terminated or revoked sooner.  Performed at [facility], [address] [place], [place] [address]   MRSA PCR Screening     Status: None   Collection Time: 01/09/21  5:27 AM   Specimen: Nasal Mucosa; Nasopharyngeal  Result Value Ref Range Status   MRSA by PCR NEGATIVE NEGATIVE Final    Comment:        The GeneXpert MRSA Assay (FDA approved for NASAL specimens only), is one component of a comprehensive MRSA colonization surveillance program. It is not intended to diagnose  MRSA infection nor to guide or monitor treatment for MRSA infections. Performed at [facility], [address] [place], [place] [address]          Radiology Studies: No results found.      Scheduled Meds: . artificial tears   Both Eyes QHS  . enoxaparin (LOVENOX) injection  40 mg Subcutaneous Q24H  . lidocaine  1 patch Transdermal q AM   Continuous Infusions:    LOS: 5 days    Time spent: 25 minutes    Sidney Ace, MD [facility] Pager [phone]  If 7PM-7AM, please contact night-coverage 01/12/2021, 10:18 AM

## 2021-01-15 DIAGNOSIS — E870 Hyperosmolality and hypernatremia: Secondary | ICD-10-CM | POA: Diagnosis not present

## 2021-01-15 DIAGNOSIS — G301 Alzheimer's disease with late onset: Secondary | ICD-10-CM | POA: Diagnosis not present

## 2021-01-15 DIAGNOSIS — K5900 Constipation, unspecified: Secondary | ICD-10-CM | POA: Diagnosis not present

## 2021-01-15 DIAGNOSIS — M1990 Unspecified osteoarthritis, unspecified site: Secondary | ICD-10-CM | POA: Diagnosis not present

## 2021-01-15 DIAGNOSIS — E559 Vitamin D deficiency, unspecified: Secondary | ICD-10-CM | POA: Diagnosis not present

## 2021-01-15 DIAGNOSIS — I10 Essential (primary) hypertension: Secondary | ICD-10-CM | POA: Diagnosis not present

## 2021-01-15 DIAGNOSIS — N390 Urinary tract infection, site not specified: Secondary | ICD-10-CM | POA: Diagnosis not present

## 2021-01-19 DIAGNOSIS — Z20828 Contact with and (suspected) exposure to other viral communicable diseases: Secondary | ICD-10-CM | POA: Diagnosis not present

## 2021-01-19 DIAGNOSIS — U071 COVID-19: Secondary | ICD-10-CM | POA: Diagnosis not present

## 2021-01-21 DIAGNOSIS — Z20828 Contact with and (suspected) exposure to other viral communicable diseases: Secondary | ICD-10-CM | POA: Diagnosis not present

## 2021-01-21 DIAGNOSIS — U071 COVID-19: Secondary | ICD-10-CM | POA: Diagnosis not present

## 2021-01-21 DIAGNOSIS — B351 Tinea unguium: Secondary | ICD-10-CM | POA: Diagnosis not present

## 2021-01-26 DIAGNOSIS — U071 COVID-19: Secondary | ICD-10-CM | POA: Diagnosis not present

## 2021-01-26 DIAGNOSIS — Z20828 Contact with and (suspected) exposure to other viral communicable diseases: Secondary | ICD-10-CM | POA: Diagnosis not present

## 2021-02-02 DIAGNOSIS — U071 COVID-19: Secondary | ICD-10-CM | POA: Diagnosis not present

## 2021-02-02 DIAGNOSIS — Z20828 Contact with and (suspected) exposure to other viral communicable diseases: Secondary | ICD-10-CM | POA: Diagnosis not present

## 2021-02-16 DIAGNOSIS — U071 COVID-19: Secondary | ICD-10-CM | POA: Diagnosis not present

## 2021-02-16 DIAGNOSIS — Z20828 Contact with and (suspected) exposure to other viral communicable diseases: Secondary | ICD-10-CM | POA: Diagnosis not present

## 2021-02-26 DIAGNOSIS — E559 Vitamin D deficiency, unspecified: Secondary | ICD-10-CM | POA: Diagnosis not present

## 2021-02-26 DIAGNOSIS — M1990 Unspecified osteoarthritis, unspecified site: Secondary | ICD-10-CM | POA: Diagnosis not present

## 2021-02-26 DIAGNOSIS — G8929 Other chronic pain: Secondary | ICD-10-CM | POA: Diagnosis not present

## 2021-02-26 DIAGNOSIS — G309 Alzheimer's disease, unspecified: Secondary | ICD-10-CM | POA: Diagnosis not present

## 2021-02-26 DIAGNOSIS — K5900 Constipation, unspecified: Secondary | ICD-10-CM | POA: Diagnosis not present

## 2021-02-26 DIAGNOSIS — I10 Essential (primary) hypertension: Secondary | ICD-10-CM | POA: Diagnosis not present

## 2021-02-26 DIAGNOSIS — K649 Unspecified hemorrhoids: Secondary | ICD-10-CM | POA: Diagnosis not present

## 2021-03-26 DIAGNOSIS — M1990 Unspecified osteoarthritis, unspecified site: Secondary | ICD-10-CM | POA: Diagnosis not present

## 2021-03-26 DIAGNOSIS — F419 Anxiety disorder, unspecified: Secondary | ICD-10-CM | POA: Diagnosis not present

## 2021-03-26 DIAGNOSIS — K5900 Constipation, unspecified: Secondary | ICD-10-CM | POA: Diagnosis not present

## 2021-03-26 DIAGNOSIS — G8929 Other chronic pain: Secondary | ICD-10-CM | POA: Diagnosis not present

## 2021-03-26 DIAGNOSIS — E559 Vitamin D deficiency, unspecified: Secondary | ICD-10-CM | POA: Diagnosis not present

## 2021-03-26 DIAGNOSIS — I10 Essential (primary) hypertension: Secondary | ICD-10-CM | POA: Diagnosis not present

## 2021-03-26 DIAGNOSIS — G309 Alzheimer's disease, unspecified: Secondary | ICD-10-CM | POA: Diagnosis not present

## 2021-04-23 DIAGNOSIS — K5900 Constipation, unspecified: Secondary | ICD-10-CM | POA: Diagnosis not present

## 2021-04-23 DIAGNOSIS — I10 Essential (primary) hypertension: Secondary | ICD-10-CM | POA: Diagnosis not present

## 2021-04-23 DIAGNOSIS — E559 Vitamin D deficiency, unspecified: Secondary | ICD-10-CM | POA: Diagnosis not present

## 2021-04-23 DIAGNOSIS — G309 Alzheimer's disease, unspecified: Secondary | ICD-10-CM | POA: Diagnosis not present

## 2021-04-23 DIAGNOSIS — M1990 Unspecified osteoarthritis, unspecified site: Secondary | ICD-10-CM | POA: Diagnosis not present

## 2021-04-23 DIAGNOSIS — F419 Anxiety disorder, unspecified: Secondary | ICD-10-CM | POA: Diagnosis not present

## 2021-04-23 DIAGNOSIS — G8929 Other chronic pain: Secondary | ICD-10-CM | POA: Diagnosis not present

## 2021-05-21 DIAGNOSIS — G301 Alzheimer's disease with late onset: Secondary | ICD-10-CM | POA: Diagnosis not present

## 2021-05-21 DIAGNOSIS — M1990 Unspecified osteoarthritis, unspecified site: Secondary | ICD-10-CM | POA: Diagnosis not present

## 2021-05-21 DIAGNOSIS — E559 Vitamin D deficiency, unspecified: Secondary | ICD-10-CM | POA: Diagnosis not present

## 2021-05-21 DIAGNOSIS — K5900 Constipation, unspecified: Secondary | ICD-10-CM | POA: Diagnosis not present

## 2021-05-21 DIAGNOSIS — G8929 Other chronic pain: Secondary | ICD-10-CM | POA: Diagnosis not present

## 2021-05-21 DIAGNOSIS — I10 Essential (primary) hypertension: Secondary | ICD-10-CM | POA: Diagnosis not present

## 2021-05-21 DIAGNOSIS — G309 Alzheimer's disease, unspecified: Secondary | ICD-10-CM | POA: Diagnosis not present

## 2021-05-21 DIAGNOSIS — M159 Polyosteoarthritis, unspecified: Secondary | ICD-10-CM | POA: Diagnosis not present

## 2022-05-13 DEATH — deceased
